# Patient Record
Sex: Female | Born: 1935 | Race: White | Hispanic: No | Smoking: Current every day smoker
Health system: Southern US, Community
[De-identification: ages and names within clinical notes are randomized; demographics above are authoritative.]

## PROBLEM LIST (undated history)

## (undated) DIAGNOSIS — F329 Major depressive disorder, single episode, unspecified: Secondary | ICD-10-CM

## (undated) DIAGNOSIS — J439 Emphysema, unspecified: Secondary | ICD-10-CM

## (undated) DIAGNOSIS — J449 Chronic obstructive pulmonary disease, unspecified: Secondary | ICD-10-CM

## (undated) DIAGNOSIS — E785 Hyperlipidemia, unspecified: Secondary | ICD-10-CM

## (undated) DIAGNOSIS — I10 Essential (primary) hypertension: Secondary | ICD-10-CM

## (undated) DIAGNOSIS — I714 Abdominal aortic aneurysm, without rupture, unspecified: Secondary | ICD-10-CM

## (undated) DIAGNOSIS — K219 Gastro-esophageal reflux disease without esophagitis: Secondary | ICD-10-CM

## (undated) DIAGNOSIS — Z87891 Personal history of nicotine dependence: Secondary | ICD-10-CM

## (undated) DIAGNOSIS — I4891 Unspecified atrial fibrillation: Secondary | ICD-10-CM

## (undated) DIAGNOSIS — T7840XA Allergy, unspecified, initial encounter: Secondary | ICD-10-CM

## (undated) DIAGNOSIS — N289 Disorder of kidney and ureter, unspecified: Secondary | ICD-10-CM

## (undated) DIAGNOSIS — F32A Depression, unspecified: Secondary | ICD-10-CM

## (undated) DIAGNOSIS — Z5189 Encounter for other specified aftercare: Secondary | ICD-10-CM

## (undated) DIAGNOSIS — D649 Anemia, unspecified: Secondary | ICD-10-CM

## (undated) DIAGNOSIS — Z86718 Personal history of other venous thrombosis and embolism: Secondary | ICD-10-CM

## (undated) DIAGNOSIS — E079 Disorder of thyroid, unspecified: Secondary | ICD-10-CM

## (undated) DIAGNOSIS — I2510 Atherosclerotic heart disease of native coronary artery without angina pectoris: Secondary | ICD-10-CM

## (undated) DIAGNOSIS — I509 Heart failure, unspecified: Secondary | ICD-10-CM

## (undated) HISTORY — DX: Emphysema, unspecified: J43.9

## (undated) HISTORY — PX: CARDIAC SURGERY: SHX584

## (undated) HISTORY — DX: Heart failure, unspecified: I50.9

## (undated) HISTORY — DX: Personal history of nicotine dependence: Z87.891

## (undated) HISTORY — DX: Disorder of thyroid, unspecified: E07.9

## (undated) HISTORY — PX: APPENDECTOMY: SHX54

## (undated) HISTORY — PX: EYE SURGERY: SHX253

## (undated) HISTORY — DX: Encounter for other specified aftercare: Z51.89

## (undated) HISTORY — PX: CHOLECYSTECTOMY: SHX55

## (undated) HISTORY — DX: Allergy, unspecified, initial encounter: T78.40XA

## (undated) HISTORY — DX: Hyperlipidemia, unspecified: E78.5

## (undated) HISTORY — DX: Depression, unspecified: F32.A

## (undated) HISTORY — PX: ABDOMINAL HYSTERECTOMY: SHX81

---
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
## 2010-11-26 DIAGNOSIS — I10 Essential (primary) hypertension: Secondary | ICD-10-CM | POA: Insufficient documentation

## 2010-11-26 DIAGNOSIS — E785 Hyperlipidemia, unspecified: Secondary | ICD-10-CM | POA: Insufficient documentation

## 2010-11-26 DIAGNOSIS — I480 Paroxysmal atrial fibrillation: Secondary | ICD-10-CM | POA: Insufficient documentation

## 2010-12-22 DIAGNOSIS — M79605 Pain in left leg: Secondary | ICD-10-CM | POA: Insufficient documentation

## 2011-07-21 DIAGNOSIS — M19019 Primary osteoarthritis, unspecified shoulder: Secondary | ICD-10-CM | POA: Insufficient documentation

## 2011-07-21 DIAGNOSIS — M5430 Sciatica, unspecified side: Secondary | ICD-10-CM | POA: Insufficient documentation

## 2011-07-21 DIAGNOSIS — M47812 Spondylosis without myelopathy or radiculopathy, cervical region: Secondary | ICD-10-CM | POA: Insufficient documentation

## 2011-07-21 DIAGNOSIS — M25519 Pain in unspecified shoulder: Secondary | ICD-10-CM | POA: Insufficient documentation

## 2011-11-11 DIAGNOSIS — F3341 Major depressive disorder, recurrent, in partial remission: Secondary | ICD-10-CM | POA: Insufficient documentation

## 2012-07-02 DIAGNOSIS — K219 Gastro-esophageal reflux disease without esophagitis: Secondary | ICD-10-CM | POA: Insufficient documentation

## 2012-07-02 DIAGNOSIS — E039 Hypothyroidism, unspecified: Secondary | ICD-10-CM | POA: Insufficient documentation

## 2012-08-03 DIAGNOSIS — I25118 Atherosclerotic heart disease of native coronary artery with other forms of angina pectoris: Secondary | ICD-10-CM | POA: Insufficient documentation

## 2012-08-03 DIAGNOSIS — I2510 Atherosclerotic heart disease of native coronary artery without angina pectoris: Secondary | ICD-10-CM | POA: Insufficient documentation

## 2012-08-07 DIAGNOSIS — D649 Anemia, unspecified: Secondary | ICD-10-CM | POA: Insufficient documentation

## 2012-08-09 DIAGNOSIS — M5136 Other intervertebral disc degeneration, lumbar region: Secondary | ICD-10-CM | POA: Insufficient documentation

## 2013-01-14 DIAGNOSIS — M659 Synovitis and tenosynovitis, unspecified: Secondary | ICD-10-CM | POA: Insufficient documentation

## 2013-01-14 DIAGNOSIS — M2010 Hallux valgus (acquired), unspecified foot: Secondary | ICD-10-CM | POA: Insufficient documentation

## 2013-01-14 DIAGNOSIS — M2040 Other hammer toe(s) (acquired), unspecified foot: Secondary | ICD-10-CM | POA: Insufficient documentation

## 2013-02-25 DIAGNOSIS — B351 Tinea unguium: Secondary | ICD-10-CM | POA: Insufficient documentation

## 2013-09-03 DIAGNOSIS — I498 Other specified cardiac arrhythmias: Secondary | ICD-10-CM | POA: Insufficient documentation

## 2014-06-18 DIAGNOSIS — K279 Peptic ulcer, site unspecified, unspecified as acute or chronic, without hemorrhage or perforation: Secondary | ICD-10-CM | POA: Insufficient documentation

## 2015-07-01 ENCOUNTER — Encounter: Payer: Self-pay | Admitting: Family Medicine

## 2015-07-01 ENCOUNTER — Other Ambulatory Visit: Payer: Self-pay | Admitting: Family Medicine

## 2015-07-01 DIAGNOSIS — Z87891 Personal history of nicotine dependence: Secondary | ICD-10-CM

## 2015-07-01 DIAGNOSIS — F17200 Nicotine dependence, unspecified, uncomplicated: Secondary | ICD-10-CM | POA: Insufficient documentation

## 2015-07-01 HISTORY — DX: Personal history of nicotine dependence: Z87.891

## 2015-07-03 ENCOUNTER — Ambulatory Visit
Admission: RE | Admit: 2015-07-03 | Discharge: 2015-07-03 | Disposition: A | Discharge status: Home / Self Care | Payer: Medicare Other | Source: Ambulatory Visit | Attending: Family Medicine | Admitting: Family Medicine

## 2015-07-03 ENCOUNTER — Inpatient Hospital Stay: Payer: Medicare Other | Attending: Family Medicine | Admitting: Family Medicine

## 2015-07-03 ENCOUNTER — Encounter: Payer: Self-pay | Admitting: Family Medicine

## 2015-07-03 DIAGNOSIS — Z122 Encounter for screening for malignant neoplasm of respiratory organs: Secondary | ICD-10-CM

## 2015-07-03 DIAGNOSIS — I2510 Atherosclerotic heart disease of native coronary artery without angina pectoris: Secondary | ICD-10-CM | POA: Insufficient documentation

## 2015-07-03 DIAGNOSIS — I700 Atherosclerosis of aorta: Secondary | ICD-10-CM | POA: Insufficient documentation

## 2015-07-03 DIAGNOSIS — Z87891 Personal history of nicotine dependence: Secondary | ICD-10-CM | POA: Diagnosis present

## 2015-07-03 NOTE — Progress Notes (Signed)
In accordance with CMS guidelines, patient has meet eligibility criteria including age, absence of signs or symptoms of lung cancer, the specific calculation of cigarette smoking pack-years was 60 years and is a current smoker.   A shared decision-making session was conducted prior to the performance of CT scan. This includes one or more decision aids, includes benefits and harms of screening, follow-up diagnostic testing, over-diagnosis, false positive rate, and total radiation exposure.  Counseling on the importance of adherence to annual lung cancer LDCT screening, impact of co-morbidities, and ability or willingness to undergo diagnosis and treatment is imperative for compliance of the program.  Counseling on the importance of continued smoking cessation for former smokers; the importance of smoking cessation for current smokers and information about tobacco cessation interventions have been given to patient including the [facility], 1800 quit [place], as well as Cancer Center specific smoking cessation programs.  Written order for lung cancer screening with LDCT has been given to the patient and any and all questions have been answered to the best of my abilities.   Yearly follow up will be scheduled by Shawn Perkins, Thoracic Navigator.   

## 2015-07-07 ENCOUNTER — Telehealth: Payer: Self-pay | Admitting: *Deleted

## 2015-07-07 NOTE — Telephone Encounter (Signed)
Notified patient of LDCT lung cancer screening results of Lung Rads 3S finding with recommendation for 6 month follow up imaging. Also notified of incidental finding noted below. Patient verbalizes understanding.   IMPRESSION: 1. Lung-Rads category 3S, probably benign findings. Short-term follow-up in 6 months is recommended with repeat low-dose chest CT without contrast (please use the following order, "CT CHEST LCS NODULE FOLLOW-UP W/O CM"). 2. Aortic atherosclerosis and coronary artery calcification.

## 2015-07-29 ENCOUNTER — Inpatient Hospital Stay: Payer: Medicare Other

## 2015-07-29 ENCOUNTER — Inpatient Hospital Stay
Admission: EM | Admit: 2015-07-29 | Discharge: 2015-08-01 | DRG: 812 | Disposition: A | Discharge status: Home / Self Care | Payer: Medicare Other | Attending: Internal Medicine | Admitting: Internal Medicine

## 2015-07-29 ENCOUNTER — Encounter: Payer: Self-pay | Admitting: Emergency Medicine

## 2015-07-29 DIAGNOSIS — D62 Acute posthemorrhagic anemia: Principal | ICD-10-CM | POA: Diagnosis present

## 2015-07-29 DIAGNOSIS — F17210 Nicotine dependence, cigarettes, uncomplicated: Secondary | ICD-10-CM | POA: Diagnosis present

## 2015-07-29 DIAGNOSIS — Z91041 Radiographic dye allergy status: Secondary | ICD-10-CM | POA: Diagnosis not present

## 2015-07-29 DIAGNOSIS — K219 Gastro-esophageal reflux disease without esophagitis: Secondary | ICD-10-CM | POA: Diagnosis present

## 2015-07-29 DIAGNOSIS — K621 Rectal polyp: Secondary | ICD-10-CM | POA: Diagnosis present

## 2015-07-29 DIAGNOSIS — E876 Hypokalemia: Secondary | ICD-10-CM | POA: Diagnosis present

## 2015-07-29 DIAGNOSIS — F329 Major depressive disorder, single episode, unspecified: Secondary | ICD-10-CM | POA: Diagnosis present

## 2015-07-29 DIAGNOSIS — G629 Polyneuropathy, unspecified: Secondary | ICD-10-CM | POA: Diagnosis present

## 2015-07-29 DIAGNOSIS — Z716 Tobacco abuse counseling: Secondary | ICD-10-CM

## 2015-07-29 DIAGNOSIS — K640 First degree hemorrhoids: Secondary | ICD-10-CM | POA: Diagnosis present

## 2015-07-29 DIAGNOSIS — I10 Essential (primary) hypertension: Secondary | ICD-10-CM | POA: Diagnosis present

## 2015-07-29 DIAGNOSIS — E039 Hypothyroidism, unspecified: Secondary | ICD-10-CM | POA: Diagnosis present

## 2015-07-29 DIAGNOSIS — Z8711 Personal history of peptic ulcer disease: Secondary | ICD-10-CM | POA: Diagnosis not present

## 2015-07-29 DIAGNOSIS — K2980 Duodenitis without bleeding: Secondary | ICD-10-CM | POA: Diagnosis present

## 2015-07-29 DIAGNOSIS — Z882 Allergy status to sulfonamides status: Secondary | ICD-10-CM | POA: Diagnosis not present

## 2015-07-29 DIAGNOSIS — Z7982 Long term (current) use of aspirin: Secondary | ICD-10-CM

## 2015-07-29 DIAGNOSIS — Z9889 Other specified postprocedural states: Secondary | ICD-10-CM | POA: Diagnosis not present

## 2015-07-29 DIAGNOSIS — Z79899 Other long term (current) drug therapy: Secondary | ICD-10-CM | POA: Diagnosis not present

## 2015-07-29 DIAGNOSIS — Z9049 Acquired absence of other specified parts of digestive tract: Secondary | ICD-10-CM

## 2015-07-29 DIAGNOSIS — K921 Melena: Secondary | ICD-10-CM | POA: Diagnosis present

## 2015-07-29 DIAGNOSIS — Z7901 Long term (current) use of anticoagulants: Secondary | ICD-10-CM

## 2015-07-29 DIAGNOSIS — I482 Chronic atrial fibrillation: Secondary | ICD-10-CM | POA: Diagnosis present

## 2015-07-29 DIAGNOSIS — K922 Gastrointestinal hemorrhage, unspecified: Secondary | ICD-10-CM

## 2015-07-29 DIAGNOSIS — E785 Hyperlipidemia, unspecified: Secondary | ICD-10-CM | POA: Diagnosis present

## 2015-07-29 DIAGNOSIS — D500 Iron deficiency anemia secondary to blood loss (chronic): Secondary | ICD-10-CM | POA: Diagnosis present

## 2015-07-29 DIAGNOSIS — R1084 Generalized abdominal pain: Secondary | ICD-10-CM

## 2015-07-29 DIAGNOSIS — J449 Chronic obstructive pulmonary disease, unspecified: Secondary | ICD-10-CM | POA: Diagnosis present

## 2015-07-29 DIAGNOSIS — Z90710 Acquired absence of both cervix and uterus: Secondary | ICD-10-CM | POA: Diagnosis not present

## 2015-07-29 DIAGNOSIS — F419 Anxiety disorder, unspecified: Secondary | ICD-10-CM | POA: Diagnosis present

## 2015-07-29 DIAGNOSIS — D649 Anemia, unspecified: Secondary | ICD-10-CM

## 2015-07-29 HISTORY — DX: Unspecified atrial fibrillation: I48.91

## 2015-07-29 HISTORY — DX: Disorder of kidney and ureter, unspecified: N28.9

## 2015-07-29 HISTORY — DX: Essential (primary) hypertension: I10

## 2015-07-29 HISTORY — DX: Anemia, unspecified: D64.9

## 2015-07-29 HISTORY — DX: Chronic obstructive pulmonary disease, unspecified: J44.9

## 2015-07-29 HISTORY — DX: Personal history of other venous thrombosis and embolism: Z86.718

## 2015-07-29 HISTORY — DX: Gastro-esophageal reflux disease without esophagitis: K21.9

## 2015-07-29 LAB — CBC WITH DIFFERENTIAL/PLATELET
Basophils Absolute: 0.1 10*3/uL (ref 0–0.1)
Basophils Relative: 2 %
Eosinophils Absolute: 0.2 10*3/uL (ref 0–0.7)
Eosinophils Relative: 5 %
HCT: 15.2 % — ABNORMAL LOW (ref 35.0–47.0)
Hemoglobin: 4.4 g/dL — CL (ref 12.0–16.0)
Lymphocytes Relative: 24 %
Lymphs Abs: 0.8 10*3/uL — ABNORMAL LOW (ref 1.0–3.6)
MCH: 19 pg — ABNORMAL LOW (ref 26.0–34.0)
MCHC: 29.2 g/dL — ABNORMAL LOW (ref 32.0–36.0)
MCV: 65.2 fL — ABNORMAL LOW (ref 80.0–100.0)
Monocytes Absolute: 0.3 10*3/uL (ref 0.2–0.9)
Monocytes Relative: 8 %
Neutro Abs: 2 10*3/uL (ref 1.4–6.5)
Neutrophils Relative %: 61 %
Platelets: 302 10*3/uL (ref 150–440)
RBC: 2.34 MIL/uL — ABNORMAL LOW (ref 3.80–5.20)
RDW: 19.7 % — ABNORMAL HIGH (ref 11.5–14.5)
WBC: 3.3 10*3/uL — ABNORMAL LOW (ref 3.6–11.0)

## 2015-07-29 LAB — PROTIME-INR
INR: 2.87
Prothrombin Time: 29.6 seconds — ABNORMAL HIGH (ref 11.4–15.0)

## 2015-07-29 LAB — TROPONIN I: Troponin I: <0.03 ng/mL (ref <0.031)

## 2015-07-29 LAB — COMPREHENSIVE METABOLIC PANEL
ALT: 10 U/L — ABNORMAL LOW (ref 14–54)
AST: 19 U/L (ref 15–41)
Albumin: 3.3 g/dL — ABNORMAL LOW (ref 3.5–5.0)
Alkaline Phosphatase: 89 U/L (ref 38–126)
Anion gap: 7 (ref 5–15)
BUN: 17 mg/dL (ref 6–20)
CO2: 21 mmol/L — ABNORMAL LOW (ref 22–32)
Calcium: 8.4 mg/dL — ABNORMAL LOW (ref 8.9–10.3)
Chloride: 107 mmol/L (ref 101–111)
Creatinine, Ser: 1.02 mg/dL — ABNORMAL HIGH (ref 0.44–1.00)
GFR calc Af Amer: 60 mL/min — ABNORMAL LOW (ref >60)
GFR calc non Af Amer: 52 mL/min — ABNORMAL LOW (ref >60)
Glucose, Bld: 159 mg/dL — ABNORMAL HIGH (ref 65–99)
Potassium: 3.4 mmol/L — ABNORMAL LOW (ref 3.5–5.1)
Sodium: 135 mmol/L (ref 135–145)
Total Bilirubin: 0.4 mg/dL (ref 0.3–1.2)
Total Protein: 6.7 g/dL (ref 6.5–8.1)

## 2015-07-29 LAB — ABO/RH: ABO/RH(D): A POS

## 2015-07-29 LAB — PREPARE RBC (CROSSMATCH)

## 2015-07-29 MED ORDER — PHYTONADIONE 5 MG PO TABS
5.0000 mg | ORAL_TABLET | Freq: Once | ORAL | Status: DC
  Filled 2015-07-29: qty 1

## 2015-07-29 MED ORDER — DEXTROSE 5 % IV SOLN
10.0000 mg | Freq: Once | INTRAVENOUS | Status: AC
  Administered 2015-07-29: 14:00:00 10 mg via INTRAVENOUS
  Filled 2015-07-29: qty 1

## 2015-07-29 MED ORDER — CARVEDILOL 12.5 MG PO TABS
12.5000 mg | ORAL_TABLET | Freq: Two times a day (BID) | ORAL | Status: DC
  Administered 2015-07-29 – 2015-07-31 (×5): 12.5 mg via ORAL
  Filled 2015-07-29 (×5): qty 1

## 2015-07-29 MED ORDER — DIPHENHYDRAMINE HCL 25 MG PO CAPS
25.0000 mg | ORAL_CAPSULE | Freq: Every day | ORAL | Status: DC
  Administered 2015-07-29 – 2015-07-31 (×3): 25 mg via ORAL
  Filled 2015-07-29 (×3): qty 1

## 2015-07-29 MED ORDER — GEMFIBROZIL 600 MG PO TABS
600.0000 mg | ORAL_TABLET | Freq: Two times a day (BID) | ORAL | Status: DC
Start: 2015-07-29 — End: 2015-08-01
  Administered 2015-07-30 – 2015-07-31 (×4): 600 mg via ORAL
  Filled 2015-07-29 (×9): qty 1

## 2015-07-29 MED ORDER — VENLAFAXINE HCL ER 75 MG PO CP24
150.0000 mg | ORAL_CAPSULE | Freq: Every day | ORAL | Status: DC
  Administered 2015-07-30 – 2015-07-31 (×2): 150 mg via ORAL
  Filled 2015-07-29 (×3): qty 2

## 2015-07-29 MED ORDER — PRAVASTATIN SODIUM 40 MG PO TABS
40.0000 mg | ORAL_TABLET | Freq: Every day | ORAL | Status: DC
  Administered 2015-07-29 – 2015-07-31 (×3): 40 mg via ORAL
  Filled 2015-07-29 (×3): qty 1

## 2015-07-29 MED ORDER — GABAPENTIN 300 MG PO CAPS
600.0000 mg | ORAL_CAPSULE | Freq: Two times a day (BID) | ORAL | Status: DC
  Administered 2015-07-29 – 2015-07-31 (×5): 600 mg via ORAL
  Filled 2015-07-29 (×6): qty 2

## 2015-07-29 MED ORDER — SODIUM CHLORIDE 0.9 % IV SOLN
10.0000 mL/h | Freq: Once | INTRAVENOUS | Status: AC
  Administered 2015-07-29: 17:00:00 10 mL/h via INTRAVENOUS

## 2015-07-29 MED ORDER — DIPHENHYDRAMINE-APAP (SLEEP) 25-500 MG PO TABS: 1.0000 | ORAL_TABLET | Freq: Every day | ORAL | Status: DC

## 2015-07-29 MED ORDER — SODIUM CHLORIDE 0.9 % IV SOLN
8.0000 mg/h | INTRAVENOUS | Status: DC
  Administered 2015-07-29 – 2015-07-31 (×6): 8 mg/h via INTRAVENOUS
  Filled 2015-07-29 (×6): qty 80

## 2015-07-29 MED ORDER — SODIUM CHLORIDE 0.9 % IV SOLN
80.0000 mg | Freq: Once | INTRAVENOUS | Status: AC
  Administered 2015-07-29: 15:00:00 80 mg via INTRAVENOUS
  Filled 2015-07-29: qty 80

## 2015-07-29 MED ORDER — BARIUM SULFATE 2.1 % PO SUSP
450.0000 mL | ORAL | Status: AC
Start: 2015-07-29 — End: 2015-07-29
  Administered 2015-07-29 (×2): 450 mL via ORAL

## 2015-07-29 MED ORDER — PANTOPRAZOLE SODIUM 40 MG IV SOLR: 40.0000 mg | Freq: Two times a day (BID) | INTRAVENOUS | Status: DC

## 2015-07-29 MED ORDER — CLONAZEPAM 0.5 MG PO TABS
0.5000 mg | ORAL_TABLET | Freq: Every day | ORAL | Status: DC
  Administered 2015-07-29 – 2015-07-31 (×3): 0.5 mg via ORAL
  Filled 2015-07-29 (×3): qty 1

## 2015-07-29 MED ORDER — CHOLESTYRAMINE LIGHT 4 G PO PACK
4.0000 g | PACK | Freq: Every day | ORAL | Status: DC
  Administered 2015-07-30: 4 g via ORAL
  Filled 2015-07-29 (×3): qty 1

## 2015-07-29 MED ORDER — VENLAFAXINE HCL ER 75 MG PO CP24: 75.0000 mg | ORAL_CAPSULE | Freq: Two times a day (BID) | ORAL | Status: DC

## 2015-07-29 MED ORDER — ACETAMINOPHEN 500 MG PO TABS
500.0000 mg | ORAL_TABLET | Freq: Every day | ORAL | Status: DC
  Administered 2015-07-29 – 2015-07-31 (×3): 500 mg via ORAL
  Filled 2015-07-29 (×3): qty 1

## 2015-07-29 MED ORDER — LEVOTHYROXINE SODIUM 100 MCG PO TABS
100.0000 ug | ORAL_TABLET | Freq: Every day | ORAL | Status: DC
  Administered 2015-07-30 – 2015-07-31 (×2): 100 ug via ORAL
  Filled 2015-07-29 (×2): qty 1

## 2015-07-29 MED ORDER — VENLAFAXINE HCL ER 75 MG PO CP24
75.0000 mg | ORAL_CAPSULE | Freq: Every day | ORAL | Status: DC
  Administered 2015-07-29 – 2015-07-31 (×3): 75 mg via ORAL
  Filled 2015-07-29 (×2): qty 1

## 2015-07-29 NOTE — Consult Note (Signed)
GI Inpatient Consult Note  Reason for Consult: Severe Anemia   Attending Requesting Consult: Dr. Luberta Mutter  History of Present Illness: Eileen Mack is a 80 y.o. female with a significant paroxsymal pmh of a fib on coumadin, presented to the ED after seeing her PCP for one month of fatigue, weakness, increased SOB, and generalized abdominal pain.  She denies dark stools.  She is on coumadin.  She does not take any iron supplements.  She reports she takes omeprazole 20 mg once daily, denies any heartburn or acid reflux.  She denies NSAID use.  She had an upper endoscopy in 2015, but unable to find any results. She does not know the indication for it or the results.  She had a colonoscopy in 2005 that showed a 10 mm polyp and several smaller polyps.  Unable to locate pathology.  Patient is unsure if she has had any other colonoscopies.  She reports history of transfusions, but says it many, many years ago.  Past Medical History:  Past Medical History  Diagnosis Date  . Personal history of tobacco use, presenting hazards to health 07/01/2015  . Atrial fibrillation (HCC)   . History of blood clots   . Renal disorder   . Hypertension   . Anemia   . GERD (gastroesophageal reflux disease)   . COPD (chronic obstructive pulmonary disease) (HCC)     Problem List: Patient Active Problem List   Diagnosis Date Noted  . Severe anemia 07/29/2015  . Personal history of tobacco use, presenting hazards to health 07/01/2015    Past Surgical History: Past Surgical History  Procedure Laterality Date  . Abdominal hysterectomy    . Cholecystectomy    . Cardiac surgery      Allergies: Allergies  Allergen Reactions  . Iodinated Diagnostic Agents Hives, Itching and Rash    She states she has to be premedicated. SPM  . Sulfa Antibiotics Rash    Home Medications: Prescriptions prior to admission  Medication Sig Dispense Refill Last Dose  . aspirin EC 81 MG tablet Take 81 mg by mouth  daily.   07/29/2015 at 0830  . carvedilol (COREG) 12.5 MG tablet Take 12.5 mg by mouth 2 (two) times daily with a meal.   07/29/2015 at Unknown time  . cholestyramine light (PREVALITE) 4 g packet Take 4 g by mouth daily.   07/29/2015 at Unknown time  . clonazePAM (KLONOPIN) 0.5 MG tablet Take 0.5 mg by mouth at bedtime.   07/28/2015 at Unknown time  . cloNIDine (CATAPRES) 0.2 MG tablet Take 0.2 mg by mouth 2 (two) times daily.   07/29/2015 at Unknown time  . diphenhydramine-acetaminophen (TYLENOL PM) 25-500 MG TABS tablet Take 1 tablet by mouth at bedtime.   07/28/2015 at Unknown time  . gabapentin (NEURONTIN) 300 MG capsule Take 600 mg by mouth 2 (two) times daily.   07/29/2015 at Unknown time  . gemfibrozil (LOPID) 600 MG tablet Take 600 mg by mouth 2 (two) times daily before a meal.   07/29/2015 at Unknown time  . levothyroxine (SYNTHROID, LEVOTHROID) 100 MCG tablet Take 100 mcg by mouth daily before breakfast.   07/29/2015 at Unknown time  . lisinopril (PRINIVIL,ZESTRIL) 20 MG tablet Take 20 mg by mouth daily.   07/29/2015 at Unknown time  . nitroGLYCERIN (NITROSTAT) 0.4 MG SL tablet Place 0.4 mg under the tongue every 5 (five) minutes as needed for chest pain.   PRN  . omeprazole (PRILOSEC) 20 MG capsule Take 20 mg by mouth 2 (  two) times daily.   07/29/2015 at Unknown time  . pravastatin (PRAVACHOL) 40 MG tablet Take 40 mg by mouth at bedtime.   07/28/2015 at Unknown time  . venlafaxine XR (EFFEXOR-XR) 75 MG 24 hr capsule Take 75-150 mg by mouth 2 (two) times daily. Pt. Takes 2 tablets every morning and 1 tablet every evening.   07/29/2015 at Unknown time  . warfarin (COUMADIN) 5 MG tablet Take 5-7.5 mg by mouth at bedtime. On Fridays Pt. Take 1.5 tablets (7.5 MG)   07/28/2015 at 2200   Home medication reconciliation was completed with the patient.   Scheduled Inpatient Medications:   . sodium chloride  10 mL/hr Intravenous Once  . diphenhydrAMINE  25 mg Oral QHS   And  . acetaminophen  500 mg Oral QHS   . carvedilol  12.5 mg Oral BID WC  . [START ON 07/30/2015] cholestyramine light  4 g Oral Daily  . clonazePAM  0.5 mg Oral QHS  . gabapentin  600 mg Oral BID  . gemfibrozil  600 mg Oral BID AC  . [START ON 07/30/2015] levothyroxine  100 mcg Oral QAC breakfast  . phytonadione  5 mg Oral Once  . pravastatin  40 mg Oral QHS  . [START ON 07/30/2015] venlafaxine XR  150 mg Oral Q breakfast  . venlafaxine XR  75 mg Oral Q supper    Continuous Inpatient Infusions:   . pantoprozole (PROTONIX) infusion 8 mg/hr (07/29/15 1601)    PRN Inpatient Medications:    Family History: family history is not on file.    Social History:   reports that she has been smoking.  She does not have any smokeless tobacco history on file.   Review of Systems: Constitutional: Weight is stable.  Eyes: No changes in vision. ENT: No oral lesions, sore throat.  GI: see HPI.  Heme/Lymph: No easy bruising.  CV: No chest pain.  GU: No hematuria.  Integumentary: No rashes.  Neuro: No headaches.  Psych: No depression/anxiety.  Endocrine: No heat/cold intolerance.  Allergic/Immunologic: No urticaria.  Resp: No cough, SOB.  Musculoskeletal: No joint swelling.    Physical Examination: BP 142/58 mmHg  Pulse 62  Temp(Src) 97.7 F (36.5 C) (Oral)  Resp 18  Ht 5\' 6"  (1.676 m)  Wt 78.019 kg (172 lb)  BMI 27.77 kg/m2  SpO2 100% Gen: NAD, alert and oriented x 4, very pale HEENT: PEERLA, EOMI, Neck: supple, no JVD or thyromegaly Chest: CTA bilaterally, no wheezes, crackles, or other adventitious sounds CV: RRR, no m/g/c/r Abd: soft, epigastric tenderness, ND, +BS in all four quadrants; no HSM, guarding, ridigity, or rebound tenderness Ext: no edema, well perfused with 2+ pulses, Skin: no rash or lesions noted Lymph: no LAD  Data: Lab Results  Component Value Date   WBC 3.3* 07/29/2015   HGB 4.4* 07/29/2015   HCT 15.2* 07/29/2015   MCV 65.2* 07/29/2015   PLT 302 07/29/2015    Recent Labs Lab  07/29/15 1133  HGB 4.4*   Lab Results  Component Value Date   NA 135 07/29/2015   K 3.4* 07/29/2015   CL 107 07/29/2015   CO2 21* 07/29/2015   BUN 17 07/29/2015   CREATININE 1.02* 07/29/2015   Lab Results  Component Value Date   ALT 10* 07/29/2015   AST 19 07/29/2015   ALKPHOS 89 07/29/2015   BILITOT 0.4 07/29/2015    Recent Labs Lab 07/29/15 1133  INR 2.87    Assessment/Plan: Ms. Orlena Sheldonendergraph is a 80 y.o. female  with severe anemia, 4.4 on admission, awaiting transfusion.  Ferritin checked at PCP yesterday, 6.  Magnesium at PCP yesterday was 1.3.  B12 was WNL.   BUN 17 Creatinine 1.02  Recommendations: Continue PPI drip. We recommend Ct ab/pel completed.  We will begin evaluation with EGD as soon as she is dynamically stable.  We will continue to follow with you.  Thank you for the consult. Please call with questions or concerns.  Carney Harder, PA-C  I personally performed these services.

## 2015-07-29 NOTE — ED Provider Notes (Addendum)
[facility] Emergency Department Provider Note  ____________________________________________   I have reviewed the triage vital signs and the nursing notes.   HISTORY  Chief Complaint Abnormal labs per PCP     HPI Eileen Mack is a 80 y.o. female who presents today complaining of feeling generally weak. She went to see her primary care doctor. She states is been feeling generally weak for the last month. She states that she feels winded just moving a little bit. She denies any chest pain or shortness of breath otherwise. She has a history of atrial fibrillation she is on Coumadin she states she has had to have blood transfusions in the past, she cannot recall why. She does have yearly colonoscopy & endoscopy she has not noticed any bright red blood per rectum or melena, her doctor sent her here becauseher hemoglobin was 4.3.      Past Medical History  Diagnosis Date  . Personal history of tobacco use, presenting hazards to health 07/01/2015  . Atrial fibrillation (HCC)   . History of blood clots   . Renal disorder     Patient Active Problem List   Diagnosis Date Noted  . Personal history of tobacco use, presenting hazards to health 07/01/2015    Past Surgical History  Procedure Laterality Date  . Abdominal hysterectomy    . Cholecystectomy    . Cardiac surgery      No current outpatient prescriptions on file.  Allergies Iodinated diagnostic agents and Sulfa antibiotics  No family history on file.  Social History Social History  Substance Use Topics  . Smoking status: Current Every Day Smoker -- 1.00 packs/day for 60 years  . Smokeless tobacco: None  . Alcohol Use: None    Review of Systems Constitutional: No fever/chills Eyes: No visual changes. ENT: No sore throat. No stiff neck no neck pain Cardiovascular: Denies chest pain. Respiratory:Feels winded when she walks around Gastrointestinal:   no vomiting.  No diarrhea.  No  constipation. Genitourinary: Negative for dysuria. Musculoskeletal: Negative lower extremity swelling Skin: Negative for rash. Neurological: Negative for headaches, focal weakness or numbness. 10-point ROS otherwise negative.  ____________________________________________   PHYSICAL EXAM:  VITAL SIGNS: ED Triage Vitals  Enc Vitals Group     BP 07/29/15 1103 122/41 mmHg     Pulse Rate 07/29/15 1103 93     Resp 07/29/15 1103 18     Temp 07/29/15 1103 98.2 F (36.8 C)     Temp Source 07/29/15 1103 Oral     SpO2 07/29/15 1103 93 %     Weight 07/29/15 1103 172 lb (78.019 kg)     Height 07/29/15 1103  (1.676 m)     Head Cir --      Peak Flow --      Pain Score 07/29/15 1104 4     Pain Loc --      Pain Edu? --      Excl. in GC? --     Constitutional: Alert and oriented. Well appearing and in no acute distress. Eyes: Conjunctivae are Very pale. PERRL. EOMI. Head: Atraumatic. Nose: No congestion/rhinnorhea. Mouth/Throat: Mucous membranes are moist.  Oropharynx non-erythematous. Neck: No stridor.   Nontender with no meningismus Cardiovascular: Normal rate, regular rhythm. Grossly normal heart sounds.  Good peripheral circulation. Respiratory: Normal respiratory effort.  No retractions. Lungs CTAB. Abdominal: Soft and nontender. No distention. No guarding no rebound Back:  There is no focal tenderness or step off there is no midline tenderness  there are no lesions noted. there is no CVA tenderness Rectal exam: guaiac positive brown stool Musculoskeletal: No lower extremity tenderness. No joint effusions, no DVT signs strong distal pulses no edema Neurologic:  Normal speech and language. No gross focal neurologic deficits are appreciated.  Skin:  Skin is warm, dry and intact. No rash noted. Psychiatric: Mood and affect are normal. Speech and behavior are normal.  ____________________________________________   LABS (all labs ordered are listed, but only abnormal results are  displayed)  Labs Reviewed  PROTIME-INR  COMPREHENSIVE METABOLIC PANEL  CBC WITH DIFFERENTIAL/PLATELET  TYPE AND SCREEN   ____________________________________________  EKG  I personally interpreted any EKGs ordered by me or triage Sinus rhythm rate 67 bpm no acute ST elevation or acute ST depression normal axis unremarkable EKG ____________________________________________  RADIOLOGY  I reviewed any imaging ordered by me or triage that were performed during my shift and, if possible, patient and/or family made aware of any abnormal findings. ____________________________________________   PROCEDURES  Procedure(s) performed: None  Critical Care performed: None  ____________________________________________   INITIAL IMPRESSION / ASSESSMENT AND PLAN / ED COURSE  Pertinent labs & imaging results that were available during my care of the patient were reviewed by me and considered in my medical decision making (see chart for details).  Patient with symptomatic significant anemia will require transfusion and admission for GI bleed. No acute distress at this time. Gradual process likely. ____________________________________________   FINAL CLINICAL IMPRESSION(S) / ED DIAGNOSES  Final diagnoses:  None      This chart was dictated using voice recognition software.  Despite best efforts to proofread,  errors can occur which can change meaning.     Jeanmarie PlantJames A Christle Nolting, MD 07/29/15 1130  Jeanmarie PlantJames A Lavelle Berland, MD 07/29/15 [phone]

## 2015-07-29 NOTE — Consult Note (Signed)
Patient on coumadin and presents with hgb 4.4 and hx of 10mm polyp removed over 10 years ago.   See Sung Amabileari Richards PA complete consult  note.  Will transfuse to at least 8 and then do EGD and colonoscopy.

## 2015-07-29 NOTE — H&P (Signed)
[facility] [place] at [facility]   PATIENT NAME: Jonte Wollam    MR#:  [id number]  DATE OF BIRTH:  10-Aug-1935  DATE OF ADMISSION:  07/29/2015  PRIMARY CARE PHYSICIAN: Cyndie Mull, DO   REQUESTING/REFERRING PHYSICIAN: Dr. Rexford Maus  CHIEF COMPLAINT:   Chief Complaint  Patient presents with  . Abnormal labs per PCP     HISTORY OF PRESENT ILLNESS:  Taetum Flewellen  is a 80 y.o. female with a known history of hypertension, COPD, neuropathy, chronic atrial fibrillation on Coumadin complaints of generalized weakness and she went to see her primary doctor. Patient had a blood work done which showed hemoglobin of 4,she was sent in here for further evaluation. According to the patient she has beenA lot of fatigue and generalized weakness for the past 1 month. . Coumadin was last night. Patient denies any black stool and hematemesis or weight loss. Patient also denies any appetite problems. Patient has a history of peptic ulcer disease before.  PAST MEDICAL HISTORY:   Past Medical History  Diagnosis Date  . Personal history of tobacco use, presenting hazards to health 07/01/2015  . Atrial fibrillation (HCC)   . History of blood clots   . Renal disorder   . Hypertension   . Anemia   . GERD (gastroesophageal reflux disease)   . COPD (chronic obstructive pulmonary disease) (HCC)     PAST SURGICAL HISTOIRY:   Past Surgical History  Procedure Laterality Date  . Abdominal hysterectomy    . Cholecystectomy    . Cardiac surgery      SOCIAL HISTORY:   Social History  Substance Use Topics  . Smoking status: Current Every Day Smoker -- 1.00 packs/day for 60 years  . Smokeless tobacco: Not on file  . Alcohol Use: Not on file    FAMILY HISTORY:  No family history on file.  DRUG ALLERGIES:   Allergies  Allergen Reactions  . Iodinated Diagnostic Agents Hives, Itching and Rash    She states she has to be premedicated. SPM  . Sulfa  Antibiotics Rash    REVIEW OF SYSTEMS:  CONSTITUTIONAL: Has fatigue,, generalized weakness. EYES: No blurred or double vision.  EARS, NOSE, AND THROAT: No tinnitus or ear pain.  RESPIRATORY: No cough, shortness of breath, wheezing or hemoptysis.  CARDIOVASCULAR: No chest pain, orthopnea, edema.  GASTROINTESTINAL: No nausea, vomiting, diarrhea or abdominal pain.  GENITOURINARY: No dysuria, hematuria.  ENDOCRINE: No polyuria, nocturia,  HEMATOLOGY: Has anemia at this time. Denies any easy bruising. or bleeding SKIN: No rash or lesion. MUSCULOSKELETAL: No joint pain or arthritis.   NEUROLOGIC: No tingling, numbness, weakness.  PSYCHIATRY: No anxiety or depression.   MEDICATIONS AT HOME:   Prior to Admission medications   Medication Sig Start Date End Date Taking? Authorizing Provider  aspirin EC 81 MG tablet Take 81 mg by mouth daily.   Yes Historical Provider, MD  carvedilol (COREG) 12.5 MG tablet Take 12.5 mg by mouth 2 (two) times daily with a meal.   Yes Historical Provider, MD  cholestyramine light (PREVALITE) 4 g packet Take 4 g by mouth daily.   Yes Historical Provider, MD  clonazePAM (KLONOPIN) 0.5 MG tablet Take 0.5 mg by mouth at bedtime.   Yes Historical Provider, MD  cloNIDine (CATAPRES) 0.2 MG tablet Take 0.2 mg by mouth 2 (two) times daily.   Yes Historical Provider, MD  diphenhydramine-acetaminophen (TYLENOL PM) 25-500 MG TABS tablet Take 1 tablet by mouth at bedtime.   Yes Historical  Provider, MD  gabapentin (NEURONTIN) 300 MG capsule Take 600 mg by mouth 2 (two) times daily.   Yes Historical Provider, MD  gemfibrozil (LOPID) 600 MG tablet Take 600 mg by mouth 2 (two) times daily before a meal.   Yes Historical Provider, MD  levothyroxine (SYNTHROID, LEVOTHROID) 100 MCG tablet Take 100 mcg by mouth daily before breakfast.   Yes Historical Provider, MD  lisinopril (PRINIVIL,ZESTRIL) 20 MG tablet Take 20 mg by mouth daily.   Yes Historical Provider, MD  nitroGLYCERIN  (NITROSTAT) 0.4 MG SL tablet Place 0.4 mg under the tongue every 5 (five) minutes as needed for chest pain.   Yes Historical Provider, MD  omeprazole (PRILOSEC) 20 MG capsule Take 20 mg by mouth 2 (two) times daily.   Yes Historical Provider, MD  pravastatin (PRAVACHOL) 40 MG tablet Take 40 mg by mouth at bedtime.   Yes Historical Provider, MD  venlafaxine XR (EFFEXOR-XR) 75 MG 24 hr capsule Take 75-150 mg by mouth 2 (two) times daily. Pt. Takes 2 tablets every morning and 1 tablet every evening.   Yes Historical Provider, MD  warfarin (COUMADIN) 5 MG tablet Take 5-7.5 mg by mouth at bedtime. On Fridays Pt. Take 1.5 tablets (7.5 MG)   Yes Historical Provider, MD      VITAL SIGNS:  Blood pressure 142/58, pulse 62, temperature 97.7 F (36.5 C), temperature source Oral, resp. rate 18, height 5\' 6"  (1.676 m), weight 78.019 kg (172 lb), SpO2 100 %.  PHYSICAL EXAMINATION:  GENERAL:  80 y.o.-year-old patient lying in the bed , appears very pale. EYES: Pupils equal, round, reactive to light and accommodation. No scleral icterus. Extraocular muscles intact.  HEENT: Head atraumatic, normocephalic. Oropharynx and nasopharynx clear.  NECK:  Supple, no jugular venous distention. No thyroid enlargement, no tenderness.  LUNGS: Normal breath sounds bilaterally, no wheezing, rales,rhonchi or crepitation. No use of accessory muscles of respiration.  CARDIOVASCULAR: S1, S2 normal. No murmurs, rubs, or gallops.  ABDOMEN: Soft, nontender, nondistended. Bowel sounds present. No organomegaly or mass.  EXTREMITIES: No pedal edema, cyanosis, or clubbing.  NEUROLOGIC: Cranial nerves II through XII are intact. Muscle strength 5/5 in all extremities. Sensation intact. Gait not checked.  PSYCHIATRIC: The patient is alert and oriented x 3.  SKIN: No obvious rash, lesion, or ulcer.   LABORATORY PANEL:   CBC  Recent Labs Lab 07/29/15 1133  WBC 3.3*  HGB 4.4*  HCT 15.2*  PLT 302    ------------------------------------------------------------------------------------------------------------------  Chemistries   Recent Labs Lab 07/29/15 1133  NA 135  K 3.4*  CL 107  CO2 21*  GLUCOSE 159*  BUN 17  CREATININE 1.02*  CALCIUM 8.4*  AST 19  ALT 10*  ALKPHOS 89  BILITOT 0.4   ------------------------------------------------------------------------------------------------------------------  Cardiac Enzymes  Recent Labs Lab 07/29/15 1133  TROPONINI <0.03   ------------------------------------------------------------------------------------------------------------------  RADIOLOGY:  No results found.  EKG:   Orders placed or performed during the hospital encounter of 07/29/15  . ED EKG  . ED EKG  . EKG 12-Lead  . EKG 12-Lead    IMPRESSION AND PLAN:  #1 severe acute anemia likely due to slow GI bleed: Transfuse 2 units RBC, Check CBC Again, GI Consult #2 Guaiac-Positive Stools Possible Due To Slow GI Bleed, History of Peptic Ulcer Disease before:*  On Protonix Drip, Hold the Coumadin. #3 History of Chronic Atrial Fibrillation: INR Is 2.8: Hold off on FFP at This Time Give Vitamin K, Hold Coumadin. Rate Controlled. #4 Showed COPD: No Wheezing. #5 History  of tobacco Abuse; the Patient Does Not Want to Quit C,ounseled Her Against  Smoking for 10 min.  All the  Hold the Coumadin,records are reviewed and case discussed with ED provider. Management plans discussed with the patient, family and they are in agreement.  CODE STATUS: full  TOTAL TIME TAKING CARE OF THIS PATIENT: 55 minutes.    Katha Hamming M.D on 07/29/2015 at 1:56 PM  Between 7am to 6pm - Pager - [phone]  After 6pm go to [email or web address] - password [email or web address] [facility]  [facility]  Office  [phone]  CC: Primary care physician; Luvenia Starch PATRICIA, DO  Note: This dictation was prepared with Dragon dictation along with smaller phrase technology. Any  transcriptional errors that result from this process are unintentional.

## 2015-07-29 NOTE — ED Notes (Signed)
Pt presents to ED after recommendation by PCP. Pt states PCP told her lab values were critical. Pt does not know which measurement. Pt states takes coumadin.

## 2015-07-30 LAB — CBC WITH DIFFERENTIAL/PLATELET
Basophils Absolute: 0.1 10*3/uL (ref 0–0.1)
Basophils Relative: 1 %
Eosinophils Absolute: 0.1 10*3/uL (ref 0–0.7)
Eosinophils Relative: 2 %
HCT: 26.6 % — ABNORMAL LOW (ref 35.0–47.0)
Hemoglobin: 8.5 g/dL — ABNORMAL LOW (ref 12.0–16.0)
Lymphocytes Relative: 16 %
Lymphs Abs: 0.7 10*3/uL — ABNORMAL LOW (ref 1.0–3.6)
MCH: 23 pg — ABNORMAL LOW (ref 26.0–34.0)
MCHC: 32.1 g/dL (ref 32.0–36.0)
MCV: 71.6 fL — ABNORMAL LOW (ref 80.0–100.0)
Monocytes Absolute: 0.5 10*3/uL (ref 0.2–0.9)
Monocytes Relative: 10 %
Neutro Abs: 3.5 10*3/uL (ref 1.4–6.5)
Neutrophils Relative %: 71 %
Platelets: 262 10*3/uL (ref 150–440)
RBC: 3.72 MIL/uL — ABNORMAL LOW (ref 3.80–5.20)
RDW: 20.9 % — ABNORMAL HIGH (ref 11.5–14.5)
WBC: 4.8 10*3/uL (ref 3.6–11.0)

## 2015-07-30 LAB — BASIC METABOLIC PANEL
Anion gap: 5 (ref 5–15)
BUN: 12 mg/dL (ref 6–20)
CO2: 22 mmol/L (ref 22–32)
Calcium: 8.4 mg/dL — ABNORMAL LOW (ref 8.9–10.3)
Chloride: 111 mmol/L (ref 101–111)
Creatinine, Ser: 0.84 mg/dL (ref 0.44–1.00)
GFR calc Af Amer: >60 mL/min (ref >60)
GFR calc non Af Amer: >60 mL/min (ref >60)
Glucose, Bld: 80 mg/dL (ref 65–99)
Potassium: 3.2 mmol/L — ABNORMAL LOW (ref 3.5–5.1)
Sodium: 138 mmol/L (ref 135–145)

## 2015-07-30 LAB — CBC
HCT: 20.4 % — ABNORMAL LOW (ref 35.0–47.0)
Hemoglobin: 6.5 g/dL — ABNORMAL LOW (ref 12.0–16.0)
MCH: 22 pg — ABNORMAL LOW (ref 26.0–34.0)
MCHC: 31.6 g/dL — ABNORMAL LOW (ref 32.0–36.0)
MCV: 69.7 fL — ABNORMAL LOW (ref 80.0–100.0)
Platelets: 275 10*3/uL (ref 150–440)
RBC: 2.93 MIL/uL — ABNORMAL LOW (ref 3.80–5.20)
RDW: 21.6 % — ABNORMAL HIGH (ref 11.5–14.5)
WBC: 4 10*3/uL (ref 3.6–11.0)

## 2015-07-30 LAB — PREPARE RBC (CROSSMATCH)

## 2015-07-30 MED ORDER — SODIUM CHLORIDE 0.9 % IV SOLN
Freq: Once | INTRAVENOUS | Status: AC
  Administered 2015-07-30: 09:00:00 via INTRAVENOUS

## 2015-07-30 NOTE — Care Management (Signed)
Admitted to [facility] with the diagnosis of anemia. Granddaughter, Arnell AsalBrethyn, lives with her for now. [phone]). States granddaughter will be moving out soon, her boyfriend is getting out of the services. Last seen Dr. Irven CoeSantayano 07/28/15. No home health. No skilled facility. Takes care of all basic and instrumental activities of daily living herself, drives. No falls. Appetite is O.K. Family will transport. Gwenette GreetBrenda S Donley Harland RN MSN CCM Care Management [phone]

## 2015-07-30 NOTE — Care Management Important Message (Signed)
Important Message  Patient Details  Name: Arcola JanskyRamona J Donn MRN: [id number] Date of Birth: 02/26/1936   Medicare Important Message Given:  Yes    Gwenette GreetBrenda S Ryley Bachtel, RN 07/30/2015, 9:08 AM

## 2015-07-30 NOTE — Plan of Care (Signed)
Problem: Health Behavior/Discharge Planning: Goal: Ability to manage health-related needs will improve Outcome: Progressing Pt hgb was 4.4 yesterday. 2 units of prbcs administered. F/u cbc this am.

## 2015-07-30 NOTE — Progress Notes (Signed)
[facility] - [place] at [facility]   PATIENT NAME: Eileen Mack    MRN#:  [id number]  DATE OF BIRTH:  1936/02/04  SUBJECTIVE:  Hospital Day: 1 day Eileen Mack is a 80 y.o. female presenting with Abnormal labs per PCP  Generalized weakness  Overnight events: Receive 2 unit packed red blood cell transfusion Interval Events: Remains weak, dyspnea on exertion, noted persistent low hemoglobin  REVIEW OF SYSTEMS:  CONSTITUTIONAL: No fever, Positive fatigue or weakness.  EYES: No blurred or double vision.  EARS, NOSE, AND THROAT: No tinnitus or ear pain.  RESPIRATORY: No cough, shortness of breath, wheezing or hemoptysis.  CARDIOVASCULAR: No chest pain, orthopnea, edema.  GASTROINTESTINAL: No nausea, vomiting, diarrhea or abdominal pain.  GENITOURINARY: No dysuria, hematuria.  ENDOCRINE: No polyuria, nocturia,  HEMATOLOGY: No anemia, easy bruising or bleeding SKIN: No rash or lesion. MUSCULOSKELETAL: No joint pain or arthritis.   NEUROLOGIC: No tingling, numbness, weakness.  PSYCHIATRY: No anxiety or depression.   DRUG ALLERGIES:   Allergies  Allergen Reactions  . Iodinated Diagnostic Agents Hives, Itching and Rash    She states she has to be premedicated. SPM  . Sulfa Antibiotics Rash    VITALS:  Blood pressure 163/58, pulse 68, temperature 98.2 F (36.8 C), temperature source Oral, resp. rate 18, height  (1.676 m), weight 78.019 kg (172 lb), SpO2 98 %.  PHYSICAL EXAMINATION:  VITAL SIGNS: Filed Vitals:   07/30/15 1300 07/30/15 1342  BP: 151/51 163/58  Pulse: 65 68  Temp: 98.2 F (36.8 C) 98.2 F (36.8 C)  Resp: 18 18   GENERAL:79 y.o.female currently in no acute distress.  HEAD: Normocephalic, atraumatic.  EYES: Pupils equal, round, reactive to light. Extraocular muscles intact. No scleral icterus.  MOUTH: Moist mucosal membrane. Dentition intact. No abscess noted.  EAR, NOSE, THROAT: Clear without exudates. No external  lesions.  NECK: Supple. No thyromegaly. No nodules. No JVD.  PULMONARY: Clear to ascultation, without wheeze rails or rhonci. No use of accessory muscles, Good respiratory effort. good air entry bilaterally CHEST: Nontender to palpation.  CARDIOVASCULAR: S1 and S2. Regular rate and rhythm. No murmurs, rubs, or gallops. No edema. Pedal pulses 2+ bilaterally.  GASTROINTESTINAL: Soft, nontender, nondistended. No masses. Positive bowel sounds. No hepatosplenomegaly.  MUSCULOSKELETAL: No swelling, clubbing, or edema. Range of motion full in all extremities.  NEUROLOGIC: Cranial nerves II through XII are intact. No gross focal neurological deficits. Sensation intact. Reflexes intact.  SKIN: No ulceration, lesions, rashes, or cyanosis. Skin warm and dry. Turgor intact.  PSYCHIATRIC: Mood, affect within normal limits. The patient is awake, alert and oriented x 3. Insight, judgment intact.      LABORATORY PANEL:   CBC  Recent Labs Lab 07/30/15 0526  WBC 4.0  HGB 6.5*  HCT 20.4*  PLT 275   ------------------------------------------------------------------------------------------------------------------  Chemistries   Recent Labs Lab 07/29/15 1133 07/30/15 0526  NA 135 138  K 3.4* 3.2*  CL 107 111  CO2 21* 22  GLUCOSE 159* 80  BUN 17 12  CREATININE 1.02* 0.84  CALCIUM 8.4* 8.4*  AST 19  --   ALT 10*  --   ALKPHOS 89  --   BILITOT 0.4  --    ------------------------------------------------------------------------------------------------------------------  Cardiac Enzymes  Recent Labs Lab 07/29/15 1133  TROPONINI <0.03   ------------------------------------------------------------------------------------------------------------------  RADIOLOGY:  Ct Abdomen Pelvis Wo Contrast  07/29/2015  CLINICAL DATA:  Paroxysmal atrial fibrillation, fatigue, weakness and increased shortness of breath with generalized abdominal pain for 1  month, on Coumadin, hypertension, GERD, COPD,  smoker EXAM: CT ABDOMEN AND PELVIS WITHOUT CONTRAST TECHNIQUE: Multidetector CT imaging of the abdomen and pelvis was performed following the standard protocol without IV contrast. Sagittal and coronal MPR images reconstructed from axial data set. Patient drank dilute oral contrast for exam. COMPARISON:  None FINDINGS: Lung bases clear. Low-attenuation of circulating blood consistent with anemia. Scattered atherosclerotic calcifications aorta, iliac arteries and and coronary arteries. Aneurysmal dilatation infrarenal abdominal aorta 4.2 x 4.0 cm in greatest axial dimensions image 40, extending 6.3 cm length and terminating above aortic bifurcation. Gallbladder surgically absent. Small probable peripelvic cyst inferior pole RIGHT kidney. Liver, spleen, pancreas, kidneys, and adrenal glands otherwise normal. Bladder and ureters normal appearance. Stomach decompressed, wall thickness inadequately assessed, unable to exclude wall thickening. Sigmoid diverticulosis without evidence of diverticulitis. Large and small bowel loops otherwise normal appearance. Uterus surgically absent with nonvisualization of RIGHT ovary and appendix. LEFT ovary normal size. No mass, adenopathy, free air, free fluid or inflammatory process. Bones demineralized. IMPRESSION: Anemia. Distal abdominal aortic aneurysm 4.2 x 4.0 cm extending 6.3 cm length. Minimal sigmoid diverticulosis without evidence of diverticulitis. Under distended stomach, unable to exclude gastric wall thickening in this setting. Electronically Signed   By: Ulyses SouthwardMark  Boles M.D.   On: 07/29/2015 21:30    EKG:   Orders placed or performed during the hospital encounter of 07/29/15  . ED EKG  . ED EKG  . EKG 12-Lead  . EKG 12-Lead    ASSESSMENT AND PLAN:   Eileen Mack is a 80 y.o. female presenting with Abnormal labs per PCP  And Generalized weakness Admitted 07/29/2015 : Day #: 1 day 1. Acute on chronic anemia secondary to GI bleed: Upper GI bleed, patient  states that this intermittent long-standing episodes of melena remains anemic this morning and transfuse 2 units packed red blood cells to get hemoglobin of 8, gastroenterology input appreciated plan for endoscopy colonoscopy, continue with Protonix 2. Hypothyroidism unspecified Synthroid 3. Hyperlipidemia unspecified statin therapy 4. Hypokalemia: Replace follow BMP tomorrow 5. Venous thromboembolism prophylactic: SCDs given active bleed   All the records are reviewed and case discussed with Care Management/Social Workerr. Management plans discussed with the patient, family and they are in agreement.  CODE STATUS: Full  TOTAL TIME TAKING CARE OF THIS PATIENT: 33 minutes.   POSSIBLE D/C IN 1-2 DAYS, DEPENDING ON CLINICAL CONDITION.   Jakari Jacot,  Mardi MainlandDavid K M.D on 07/30/2015 at 2:10 PM  Between 7am to 6pm - Pager - [phone]  After 6pm: House Pager: - [phone]  [facility]  Office  [phone]  CC: Primary care physician; Luvenia StarchSANTAYANA, GLORIA PATRICIA, DO

## 2015-07-30 NOTE — Plan of Care (Signed)
Problem: Bowel/Gastric: Goal: Will not experience complications related to bowel motility Outcome: Progressing Pt with complaints of abdominal pain, refuses pain meds, received 2 units of PRBC, pt voices any needs

## 2015-07-30 NOTE — Consult Note (Signed)
Patient has been transfused, color looks better.  CT scan noted small aneurysm of aorta. 4.2cm.  Will check PT and CBC in morning and make her NPO after midnight in case her labs allow an EGD tomorrow morning.

## 2015-07-31 ENCOUNTER — Inpatient Hospital Stay: Payer: Medicare Other | Admitting: Certified Registered Nurse Anesthetist

## 2015-07-31 ENCOUNTER — Encounter
Admission: EM | Disposition: A | Discharge status: Home / Self Care | Payer: Self-pay | Source: Home / Self Care | Attending: Internal Medicine

## 2015-07-31 HISTORY — PX: ESOPHAGOGASTRODUODENOSCOPY: SHX5428

## 2015-07-31 LAB — TYPE AND SCREEN
ABO/RH(D): A POS
Antibody Screen: NEGATIVE
Unit division: 0
Unit division: 0
Unit division: 0
Unit division: 0

## 2015-07-31 LAB — CBC
HCT: 26.1 % — ABNORMAL LOW (ref 35.0–47.0)
Hemoglobin: 8.6 g/dL — ABNORMAL LOW (ref 12.0–16.0)
MCH: 23.4 pg — ABNORMAL LOW (ref 26.0–34.0)
MCHC: 32.7 g/dL (ref 32.0–36.0)
MCV: 71.4 fL — ABNORMAL LOW (ref 80.0–100.0)
Platelets: 252 10*3/uL (ref 150–440)
RBC: 3.66 MIL/uL — ABNORMAL LOW (ref 3.80–5.20)
RDW: 20.3 % — ABNORMAL HIGH (ref 11.5–14.5)
WBC: 4.4 10*3/uL (ref 3.6–11.0)

## 2015-07-31 LAB — BASIC METABOLIC PANEL
Anion gap: 3 — ABNORMAL LOW (ref 5–15)
BUN: 9 mg/dL (ref 6–20)
CO2: 25 mmol/L (ref 22–32)
Calcium: 8.6 mg/dL — ABNORMAL LOW (ref 8.9–10.3)
Chloride: 111 mmol/L (ref 101–111)
Creatinine, Ser: 0.81 mg/dL (ref 0.44–1.00)
GFR calc Af Amer: >60 mL/min (ref >60)
GFR calc non Af Amer: >60 mL/min (ref >60)
Glucose, Bld: 86 mg/dL (ref 65–99)
Potassium: 3.1 mmol/L — ABNORMAL LOW (ref 3.5–5.1)
Sodium: 139 mmol/L (ref 135–145)

## 2015-07-31 LAB — PROTIME-INR
INR: 1.45
Prothrombin Time: 17.7 seconds — ABNORMAL HIGH (ref 11.4–15.0)

## 2015-07-31 SURGERY — EGD (ESOPHAGOGASTRODUODENOSCOPY)
Anesthesia: General

## 2015-07-31 MED ORDER — LIDOCAINE HCL (CARDIAC) 20 MG/ML IV SOLN
INTRAVENOUS | Status: DC | PRN
  Administered 2015-07-31: 60 mg via INTRAVENOUS

## 2015-07-31 MED ORDER — MIDAZOLAM HCL 2 MG/2ML IJ SOLN
INTRAMUSCULAR | Status: DC | PRN
  Administered 2015-07-31: 1 mg via INTRAVENOUS

## 2015-07-31 MED ORDER — PROPOFOL 500 MG/50ML IV EMUL
INTRAVENOUS | Status: DC | PRN
  Administered 2015-07-31: 120 ug/kg/min via INTRAVENOUS

## 2015-07-31 MED ORDER — PROPOFOL 10 MG/ML IV BOLUS
INTRAVENOUS | Status: DC | PRN
  Administered 2015-07-31: 10 mg via INTRAVENOUS
  Administered 2015-07-31: 20 mg via INTRAVENOUS

## 2015-07-31 MED ORDER — POTASSIUM CHLORIDE IN NACL 20-0.9 MEQ/L-% IV SOLN
INTRAVENOUS | Status: DC
  Administered 2015-07-31 (×2): via INTRAVENOUS
  Filled 2015-07-31 (×4): qty 1000

## 2015-07-31 MED ORDER — SODIUM CHLORIDE 0.9 % IV SOLN
INTRAVENOUS | Status: DC | PRN
  Administered 2015-07-31: 13:00:00 via INTRAVENOUS

## 2015-07-31 MED ORDER — ACETAMINOPHEN 325 MG PO TABS
650.0000 mg | ORAL_TABLET | Freq: Four times a day (QID) | ORAL | Status: DC | PRN
  Administered 2015-07-31: 11:00:00 650 mg via ORAL
  Filled 2015-07-31: qty 2

## 2015-07-31 MED ORDER — POLYETHYLENE GLYCOL 3350 17 GM/SCOOP PO POWD
1.0000 | Freq: Once | ORAL | Status: AC
  Administered 2015-07-31: 22:00:00 255 g via ORAL
  Filled 2015-07-31: qty 255

## 2015-07-31 MED ORDER — SODIUM CHLORIDE 0.9 % IV SOLN
INTRAVENOUS | Status: DC
  Administered 2015-07-31: 21:00:00 10 mL/h via INTRAVENOUS

## 2015-07-31 NOTE — Progress Notes (Signed)
[facility] - [place] [facility]   PATIENT NAME: Eileen GillsRamona Mack    MRN#:  [id number]  DATE OF BIRTH:  May 19, 1935  SUBJECTIVE:  Hospital Day: 2 days Eileen GillsRamona Thorman is a 80 y.o. female presenting with Abnormal labs per PCP  Generalized weakness  Overnight events: No overnight events Interval Events: Weakness somewhat improved, endoscopy planned for today  REVIEW OF SYSTEMS:  CONSTITUTIONAL: No fever, Positive fatigue or weakness.  EYES: No blurred or double vision.  EARS, NOSE, AND THROAT: No tinnitus or ear pain.  RESPIRATORY: No cough, shortness of breath, wheezing or hemoptysis.  CARDIOVASCULAR: No chest pain, orthopnea, edema.  GASTROINTESTINAL: No nausea, vomiting, diarrhea or abdominal pain.  GENITOURINARY: No dysuria, hematuria.  ENDOCRINE: No polyuria, nocturia,  HEMATOLOGY: No anemia, easy bruising or bleeding SKIN: No rash or lesion. MUSCULOSKELETAL: No joint pain or arthritis.   NEUROLOGIC: No tingling, numbness, weakness.  PSYCHIATRY: No anxiety or depression.   DRUG ALLERGIES:   Allergies  Allergen Reactions  . Iodinated Diagnostic Agents Hives, Itching and Rash    She states she has to be premedicated. SPM  . Sulfa Antibiotics Rash    VITALS:  Blood pressure 168/69, pulse 63, temperature 97.7 F (36.5 C), temperature source Tympanic, resp. rate 17, height 5\' 6"  (1.676 m), weight 78.019 kg (172 lb), SpO2 97 %.  PHYSICAL EXAMINATION:  VITAL SIGNS: Filed Vitals:   07/31/15 1404 07/31/15 1413  BP: 166/67 168/69  Pulse: 64 63  Temp:    Resp: 16 17   GENERAL:79 y.o.female currently in no acute distress.  HEAD: Normocephalic, atraumatic.  EYES: Pupils equal, round, reactive to light. Extraocular muscles intact. No scleral icterus.  MOUTH: Moist mucosal membrane. Dentition intact. No abscess noted.  EAR, NOSE, THROAT: Clear without exudates. No external lesions.  NECK: Supple. No thyromegaly. No nodules. No JVD.   PULMONARY: Clear to ascultation, without wheeze rails or rhonci. No use of accessory muscles, Good respiratory effort. good air entry bilaterally CHEST: Nontender to palpation.  CARDIOVASCULAR: S1 and S2. Regular rate and rhythm. No murmurs, rubs, or gallops. No edema. Pedal pulses 2+ bilaterally.  GASTROINTESTINAL: Soft, nontender, nondistended. No masses. Positive bowel sounds. No hepatosplenomegaly.  MUSCULOSKELETAL: No swelling, clubbing, or edema. Range of motion full in all extremities.  NEUROLOGIC: Cranial nerves II through XII are intact. No gross focal neurological deficits. Sensation intact. Reflexes intact.  SKIN: No ulceration, lesions, rashes, or cyanosis. Skin warm and dry. Turgor intact.  PSYCHIATRIC: Mood, affect within normal limits. The patient is awake, alert and oriented x 3. Insight, judgment intact.      LABORATORY PANEL:   CBC  Recent Labs Lab 07/31/15 0502  WBC 4.4  HGB 8.6*  HCT 26.1*  PLT 252   ------------------------------------------------------------------------------------------------------------------  Chemistries   Recent Labs Lab 07/29/15 1133  07/31/15 0502  NA 135  < > 139  K 3.4*  < > 3.1*  CL 107  < > 111  CO2 21*  < > 25  GLUCOSE 159*  < > 86  BUN 17  < > 9  CREATININE 1.02*  < > 0.81  CALCIUM 8.4*  < > 8.6*  AST 19  --   --   ALT 10*  --   --   ALKPHOS 89  --   --   BILITOT 0.4  --   --   < > = values in this interval not displayed. ------------------------------------------------------------------------------------------------------------------  Cardiac Enzymes  Recent Labs Lab 07/29/15 1133  TROPONINI <0.03   ------------------------------------------------------------------------------------------------------------------  RADIOLOGY:  Ct Abdomen Pelvis Wo Contrast  07/29/2015  CLINICAL DATA:  Paroxysmal atrial fibrillation, fatigue, weakness and increased shortness of breath with generalized abdominal pain for 1  month, on Coumadin, hypertension, GERD, COPD, smoker EXAM: CT ABDOMEN AND PELVIS WITHOUT CONTRAST TECHNIQUE: Multidetector CT imaging of the abdomen and pelvis was performed following the standard protocol without IV contrast. Sagittal and coronal MPR images reconstructed from axial data set. Patient drank dilute oral contrast for exam. COMPARISON:  None FINDINGS: Lung bases clear. Low-attenuation of circulating blood consistent with anemia. Scattered atherosclerotic calcifications aorta, iliac arteries and and coronary arteries. Aneurysmal dilatation infrarenal abdominal aorta 4.2 x 4.0 cm in greatest axial dimensions image 40, extending 6.3 cm length and terminating above aortic bifurcation. Gallbladder surgically absent. Small probable peripelvic cyst inferior pole RIGHT kidney. Liver, spleen, pancreas, kidneys, and adrenal glands otherwise normal. Bladder and ureters normal appearance. Stomach decompressed, wall thickness inadequately assessed, unable to exclude wall thickening. Sigmoid diverticulosis without evidence of diverticulitis. Large and small bowel loops otherwise normal appearance. Uterus surgically absent with nonvisualization of RIGHT ovary and appendix. LEFT ovary normal size. No mass, adenopathy, free air, free fluid or inflammatory process. Bones demineralized. IMPRESSION: Anemia. Distal abdominal aortic aneurysm 4.2 x 4.0 cm extending 6.3 cm length. Minimal sigmoid diverticulosis without evidence of diverticulitis. Under distended stomach, unable to exclude gastric wall thickening in this setting. Electronically Signed   By: Ulyses Southward M.D.   On: 07/29/2015 21:30    EKG:   Orders placed or performed during the hospital encounter of 07/29/15  . ED EKG  . ED EKG  . EKG 12-Lead  . EKG 12-Lead    ASSESSMENT AND PLAN:   Montgomery Rothlisberger is a 80 y.o. female presenting with Abnormal labs per PCP  And Generalized weakness Admitted 07/29/2015 : Day #: 2 days 1. Acute on chronic anemia  secondary to GI bleed: Upper GI bleed, Endoscopy reveals duodenitis no active bleeding, colonoscopy tomorrow per GI likely discharged thereafter 2. Hypothyroidism unspecified Synthroid 3. Hyperlipidemia unspecified statin therapy 4. Hypokalemia: Replace follow BMP tomorrow 5. Venous thromboembolism prophylactic: SCDs given active bleed   All the records are reviewed and case discussed with Care Management/Social Workerr. Management plans discussed with the patient, family and they are in agreement.  CODE STATUS: Full  TOTAL TIME TAKING CARE OF THIS PATIENT: 28 minutes.   POSSIBLE D/C IN 1 DAYS, DEPENDING ON CLINICAL CONDITION.   Chania Kochanski,  Mardi Mainland.D on 07/31/2015 at 3:10 PM  Between 7am to 6pm - Pager - [phone]  After 6pm: House Pager: - [phone]  [facility]  Office  [phone]  CC: Primary care physician; Luvenia Starch PATRICIA, DO

## 2015-07-31 NOTE — Transfer of Care (Signed)
Immediate Anesthesia Transfer of Care Note  Patient: Eileen Mack  Procedure(s) Performed: Procedure(s): ESOPHAGOGASTRODUODENOSCOPY (EGD) (N/A)  Patient Location: PACU  Anesthesia Type:General  Level of Consciousness: sedated  Airway & Oxygen Therapy: Patient Spontanous Breathing and Patient connected to nasal cannula oxygen  Post-op Assessment: Report given to RN and Post -op Vital signs reviewed and stable  Post vital signs: Reviewed and stable  Last Vitals:  Filed Vitals:   07/31/15 1251 07/31/15 1322  BP:  154/74  Pulse:  67  Temp: 37.6 C 35.9 C  Resp:  14    Complications: No apparent anesthesia complications

## 2015-07-31 NOTE — Consult Note (Signed)
EGD showed no abnormality.  Will prep for a colonoscopy for tomorrow and if that is neg she can go home and do capsule as out patient or do it immediately after the colonoscopy.  Hgb up to 8.

## 2015-07-31 NOTE — Op Note (Signed)
[facility] Gastroenterology Patient Name: Eileen GillsRamona Mack Procedure Date: 07/31/2015 12:38 PM MRN: [id number] Account #: [id number] Date of Birth: February 24, 1936 Admit Type: Inpatient Age: [age] [facility] Gender: Female Note Status: Finalized Procedure:            Upper GI endoscopy Indications:          Iron deficiency anemia secondary to chronic blood loss,                        Melena Providers:            Scot Junobert T. Roe Wilner, MD Referring MD:         Katha HammingSnehalatha Konidena, MD (Referring MD) Medicines:            Propofol per Anesthesia Complications:        No immediate complications. Procedure:            Pre-Anesthesia Assessment:                       - After reviewing the risks and benefits, the patient                        was deemed in satisfactory condition to undergo the                        procedure.                       After obtaining informed consent, the endoscope was                        passed under direct vision. Throughout the procedure,                        the patient's blood pressure, pulse, and oxygen                        saturations were monitored continuously. The Endoscope                        was introduced through the mouth, and advanced to the                        second part of duodenum. The upper GI endoscopy was                        accomplished without difficulty. The patient tolerated                        the procedure well. Findings:      The examined esophagus was normal. No blood present anywhere.      The stomach was normal.      The examined duodenum was normal. There was a patch of mild duodenitis       between duodenal bulb and second portion which showed no blood or       bleeding but could have been a source of bleeding a few days ago. Impression:           - Normal esophagus.                       -  Normal stomach.                       - Normal examined duodenum.                       -  No specimens collected. Recommendation:       - The findings and recommendations were discussed with                        the referring physician. Scot Jun, MD 07/31/2015 1:23:54 PM This report has been signed electronically. Number of Addenda: 0 Note Initiated On: 07/31/2015 12:38 PM      [facility]

## 2015-07-31 NOTE — Anesthesia Procedure Notes (Signed)
Date/Time: 07/31/2015 1:09 PM Performed by: Ginger CarneMICHELET, Eileen Araque Pre-anesthesia Checklist: Patient identified, Emergency Drugs available, Suction available, Patient being monitored and Timeout performed Patient Re-evaluated:Patient Re-evaluated prior to inductionOxygen Delivery Method: Nasal cannula

## 2015-07-31 NOTE — Anesthesia Preprocedure Evaluation (Signed)
Anesthesia Evaluation  Patient identified by MRN, date of birth, ID band Patient awake    Reviewed: Allergy & Precautions, H&P , NPO status , Patient's Chart, lab work & pertinent test results, reviewed documented beta blocker date and time   Airway Mallampati: II   Neck ROM: full    Dental  (+) Poor Dentition, Partial Upper, Partial Lower   Pulmonary neg pulmonary ROS, COPD, Current Smoker,    Pulmonary exam normal        Cardiovascular hypertension, negative cardio ROS Normal cardiovascular examAtrial Fibrillation  Rate:Normal     Neuro/Psych negative neurological ROS  negative psych ROS   GI/Hepatic negative GI ROS, Neg liver ROS, GERD  ,  Endo/Other  negative endocrine ROS  Renal/GU Renal diseasenegative Renal ROS  negative genitourinary   Musculoskeletal   Abdominal   Peds  Hematology negative hematology ROS (+) anemia ,   Anesthesia Other Findings Past Medical History:   Personal history of tobacco use, presenting ha* 07/01/2015     Atrial fibrillation (HCC)                                    History of blood clots                                       Renal disorder                                               Hypertension                                                 Anemia                                                       GERD (gastroesophageal reflux disease)                       COPD (chronic obstructive pulmonary disease) (*            Past Surgical History:   ABDOMINAL HYSTERECTOMY                                        CHOLECYSTECTOMY                                               CARDIAC SURGERY                                             BMI    Body Mass Index   27.77 kg/m 2  Reproductive/Obstetrics negative OB ROS                             Anesthesia Physical Anesthesia Plan  ASA: III and emergent  Anesthesia Plan: General   Post-op Pain Management:     Induction:   Airway Management Planned:   Additional Equipment:   Intra-op Plan:   Post-operative Plan:   Informed Consent: I have reviewed the patients History and Physical, chart, labs and discussed the procedure including the risks, benefits and alternatives for the proposed anesthesia with the patient or authorized representative who has indicated his/her understanding and acceptance.   Dental Advisory Given  Plan Discussed with: CRNA  Anesthesia Plan Comments:         Anesthesia Quick Evaluation

## 2015-07-31 NOTE — Anesthesia Postprocedure Evaluation (Signed)
Anesthesia Post Note  Patient: Eileen Mack  Procedure(s) Performed: Procedure(s) (LRB): ESOPHAGOGASTRODUODENOSCOPY (EGD) (N/A)  Patient location during evaluation: Endoscopy Anesthesia Type: General Level of consciousness: awake and alert Pain management: pain level controlled Vital Signs Assessment: post-procedure vital signs reviewed and stable Respiratory status: spontaneous breathing, nonlabored ventilation, respiratory function stable and patient connected to nasal cannula oxygen Cardiovascular status: blood pressure returned to baseline and stable Postop Assessment: no signs of nausea or vomiting Anesthetic complications: no    Last Vitals:  Filed Vitals:   07/31/15 1404 07/31/15 1413  BP: 166/67 168/69  Pulse: 64 63  Temp:    Resp: 16 17    Last Pain:  Filed Vitals:   07/31/15 1416  PainSc: 0-No pain                 Cleda MccreedyJoseph K Wright Gravely

## 2015-08-01 ENCOUNTER — Inpatient Hospital Stay: Payer: Medicare Other | Admitting: Anesthesiology

## 2015-08-01 ENCOUNTER — Encounter
Admission: EM | Disposition: A | Discharge status: Home / Self Care | Payer: Self-pay | Source: Home / Self Care | Attending: Internal Medicine

## 2015-08-01 HISTORY — PX: COLONOSCOPY WITH PROPOFOL: SHX5780

## 2015-08-01 SURGERY — COLONOSCOPY WITH PROPOFOL
Anesthesia: General | Laterality: Bilateral

## 2015-08-01 MED ORDER — FERROUS SULFATE 325 (65 FE) MG PO TABS: 325.0000 mg | ORAL_TABLET | Freq: Every day | ORAL | Status: AC

## 2015-08-01 MED ORDER — MIDAZOLAM HCL 2 MG/2ML IJ SOLN
INTRAMUSCULAR | Status: DC | PRN
Start: 2015-08-01 — End: 2015-08-01
  Administered 2015-08-01: 1 mg via INTRAVENOUS

## 2015-08-01 MED ORDER — PROPOFOL 10 MG/ML IV BOLUS
INTRAVENOUS | Status: DC | PRN
  Administered 2015-08-01: 50 mg via INTRAVENOUS

## 2015-08-01 MED ORDER — FENTANYL CITRATE (PF) 100 MCG/2ML IJ SOLN
INTRAMUSCULAR | Status: DC | PRN
  Administered 2015-08-01: 50 ug via INTRAVENOUS

## 2015-08-01 MED ORDER — PANTOPRAZOLE SODIUM 40 MG PO TBEC: 40.0000 mg | DELAYED_RELEASE_TABLET | Freq: Every day | ORAL | Status: DC

## 2015-08-01 MED ORDER — THERA VITAL M PO TABS: 1.0000 | ORAL_TABLET | Freq: Every day | ORAL | Status: DC

## 2015-08-01 MED ORDER — PROPOFOL 500 MG/50ML IV EMUL
INTRAVENOUS | Status: DC | PRN
  Administered 2015-08-01: 75 ug/kg/min via INTRAVENOUS

## 2015-08-01 MED ORDER — SODIUM CHLORIDE 0.9 % IV SOLN
INTRAVENOUS | Status: DC
  Administered 2015-08-01: 1000 mL via INTRAVENOUS

## 2015-08-01 MED ORDER — LACTATED RINGERS IV SOLN
INTRAVENOUS | Status: DC | PRN
  Administered 2015-08-01: 08:00:00 via INTRAVENOUS

## 2015-08-01 NOTE — Transfer of Care (Signed)
Immediate Anesthesia Transfer of Care Note  Patient: Eileen Mack  Procedure(s) Performed: Procedure(s): COLONOSCOPY WITH PROPOFOL (Bilateral)  Patient Location: PACU  Anesthesia Type:General  Level of Consciousness: awake  Airway & Oxygen Therapy: Patient Spontanous Breathing and Patient connected to nasal cannula oxygen  Post-op Assessment: Report given to RN  Post vital signs: Reviewed and stable  Last Vitals:  Filed Vitals:   08/01/15 0743 08/01/15 0830  BP: 145/57 146/71  Pulse: 67 85  Temp: 36.6 C 36.7 C  Resp: 18 16    Complications: No apparent anesthesia complications

## 2015-08-01 NOTE — Op Note (Signed)
[facility] Gastroenterology Patient Name: Eileen Mack Procedure Date: 08/01/2015 7:40 AM MRN: [id number] Account #: [id number] Date of Birth: Sep 03, 1935 Admit Type: Inpatient Age: [id number] Room: [facility] ENDO ROOM 4 Gender: Female Note Status: Finalized Procedure:            Colonoscopy Indications:          Iron deficiency anemia secondary to chronic blood loss Providers:            Scot Junobert T. [place] Huberty, MD Referring MD:         Leretha DykesGloria P. Mack (Referring MD) Medicines:            Propofol per Anesthesia Complications:        No immediate complications. Procedure:            Pre-Anesthesia Assessment:                       - After reviewing the risks and benefits, the patient                        was deemed in satisfactory condition to undergo the                        procedure.                       After obtaining informed consent, the colonoscope was                        passed under direct vision. Throughout the procedure,                        the patient's blood pressure, pulse, and oxygen                        saturations were monitored continuously. The                        Colonoscope was introduced through the anus and                        advanced to the the cecum, identified by appendiceal                        orifice and ileocecal valve. The colonoscopy was                        performed without difficulty. The patient tolerated the                        procedure well. The quality of the bowel preparation                        was excellent. Findings:      A few small-mouthed diverticula were found in the sigmoid colon.      Internal hemorrhoids were found during endoscopy. The hemorrhoids were       small and Grade I (internal hemorrhoids that do not prolapse).      Two sessile polyps were found in the rectum. The polyps were diminutive       in size. These polyps were removed with a cold biopsy forceps.  Resection       and  retrieval were complete.      Internal hemorrhoids were found during endoscopy. The hemorrhoids were       small and Grade I (internal hemorrhoids that do not prolapse).      The exam was otherwise without abnormality. Impression:           - Diverticulosis in the sigmoid colon.                       - Internal hemorrhoids.                       - Two diminutive polyps in the rectum, removed with a                        cold biopsy forceps. Resected and retrieved.                       - Internal hemorrhoids.                       - The examination was otherwise normal. Recommendation:       - Await pathology results. Scot Jun, MD 08/01/2015 8:30:53 AM This report has been signed electronically. Number of Addenda: 0 Note Initiated On: 08/01/2015 7:40 AM Scope Withdrawal Time: 0 hours 13 minutes 51 seconds  Total Procedure Duration: 0 hours 19 minutes 54 seconds       [facility]

## 2015-08-01 NOTE — Anesthesia Procedure Notes (Signed)
Performed by: Senora Lacson Oxygen Delivery Method: Nasal cannula     

## 2015-08-01 NOTE — Anesthesia Postprocedure Evaluation (Signed)
Anesthesia Post Note  Patient: Eileen Mack  Procedure(s) Performed: Procedure(s) (LRB): COLONOSCOPY WITH PROPOFOL (Bilateral)  Patient location during evaluation: PACU Anesthesia Type: General Level of consciousness: awake and alert Pain management: pain level controlled Vital Signs Assessment: post-procedure vital signs reviewed and stable Respiratory status: spontaneous breathing, nonlabored ventilation, respiratory function stable and patient connected to nasal cannula oxygen Cardiovascular status: blood pressure returned to baseline and stable Postop Assessment: no signs of nausea or vomiting Anesthetic complications: no    Last Vitals:  Filed Vitals:   08/01/15 0846 08/01/15 0855  BP: 171/70 175/61  Pulse: 67 66  Temp:    Resp: 14 14    Last Pain:  Filed Vitals:   08/01/15 0857  PainSc: 0-No pain                 Yevette EdwardsJames G Maia Handa

## 2015-08-01 NOTE — Progress Notes (Signed)
Pt is alert and oriented x 4, denies pain, colonoscopy performed in am with removal of polyps, pt is d/c to home, pt to continue on omeprazole, pt notes that she already has rx for 40 mg omeprazole at home that she receives via mail order. She is to f/u with pcp and Dr. Markham JordanElliot, reports understanding of d/c instructions and has no further questions at this time.

## 2015-08-01 NOTE — Discharge Summary (Addendum)
[facility] [place] [facility]   PATIENT NAME: Eileen Mack    MR#:  [id number]  DATE OF BIRTH:  [birth year]  DATE OF ADMISSION:  07/29/2015 ADMITTING PHYSICIAN: Katha Hamming, MD  DATE OF DISCHARGE: 08/01/2015  PRIMARY CARE PHYSICIAN: Luvenia Starch PATRICIA, DO    ADMISSION DIAGNOSIS:  Iron deficiency anemia due to chronic blood loss [D50.0] Gastrointestinal hemorrhage, unspecified gastritis, unspecified gastrointestinal hemorrhage type [K92.2]  DISCHARGE DIAGNOSIS:  Active Problems:   Severe anemia   SECONDARY DIAGNOSIS:   Past Medical History  Diagnosis Date  . Personal history of tobacco use, presenting hazards to health 07/01/2015  . Atrial fibrillation (HCC)   . History of blood clots   . Renal disorder   . Hypertension   . Anemia   . GERD (gastroesophageal reflux disease)   . COPD (chronic obstructive pulmonary disease) [facility])     HOSPITAL COURSE:    80 year old female with a history of atrial fibrillation and essential hypertension who presented with significant weakness and found to have significant anemia. For further details please refer the H&P.  1. Acute on chronic anemia: Patient underwent EGD and colonoscopy. Both of these showed no areas of concernFor possible source of bleeding/anemia. She will follow-up as an outpatient with GI for capsule endoscopy to check the small bowel. Due to her significant anemia, Coumadin has been discontinued at this time. She may continue low-dose aspirin. She will also be discharged with ferrous sulfate and ultimately vitamin as per the request of GI physician.  2. History of atrial fibrillation: Patient will continue rate control with Coreg. Coumadin has been stopped for now due to problem #1.  3. Essential hypertension: She will continue clonidine, lisinopril and Coreg.  4. Hypothyroid: She will continue Synthroid.  5. Hyperlipidemia: Continue pravastatin and gemfibrozil.  6.  Anxiety/depression: Continue Effexor or  DISCHARGE CONDITIONS AND DIET:   Stable for discharge on a heart healthy diet.  CONSULTS OBTAINED:  Treatment Team:  Scot Jun, MD  DRUG ALLERGIES:   Allergies  Allergen Reactions  . Iodinated Diagnostic Agents Hives, Itching and Rash    She states she has to be premedicated. SPM  . Sulfa Antibiotics Rash    DISCHARGE MEDICATIONS:   Current Discharge Medication List    START taking these medications   Details  ferrous sulfate 325 (65 FE) MG tablet Take 1 tablet (325 mg total) by mouth daily with breakfast. Qty: 60 tablet, Refills: 3    Multiple Vitamins-Minerals (MULTIVITAMIN) tablet Take 1 tablet by mouth daily. Qty: 90 tablet, Refills: 0    pantoprazole (PROTONIX) 40 MG tablet Take 1 tablet (40 mg total) by mouth daily. Qty: 30 tablet, Refills: 0      CONTINUE these medications which have NOT CHANGED   Details  aspirin EC 81 MG tablet Take 81 mg by mouth daily.    carvedilol (COREG) 12.5 MG tablet Take 12.5 mg by mouth 2 (two) times daily with a meal.    cholestyramine light (PREVALITE) 4 g packet Take 4 g by mouth daily.    clonazePAM (KLONOPIN) 0.5 MG tablet Take 0.5 mg by mouth at bedtime.    cloNIDine (CATAPRES) 0.2 MG tablet Take 0.2 mg by mouth 2 (two) times daily.    diphenhydramine-acetaminophen (TYLENOL PM) 25-500 MG TABS tablet Take 1 tablet by mouth at bedtime.    gabapentin (NEURONTIN) 300 MG capsule Take 600 mg by mouth 2 (two) times daily.    gemfibrozil (LOPID) 600 MG tablet Take 600  mg by mouth 2 (two) times daily before a meal.    levothyroxine (SYNTHROID, LEVOTHROID) 100 MCG tablet Take 100 mcg by mouth daily before breakfast.    lisinopril (PRINIVIL,ZESTRIL) 20 MG tablet Take 20 mg by mouth daily.    nitroGLYCERIN (NITROSTAT) 0.4 MG SL tablet Place 0.4 mg under the tongue every 5 (five) minutes as needed for chest pain.    pravastatin (PRAVACHOL) 40 MG tablet Take 40 mg by mouth at bedtime.     venlafaxine XR (EFFEXOR-XR) 75 MG 24 hr capsule Take 75-150 mg by mouth 2 (two) times daily. Pt. Takes 2 tablets every morning and 1 tablet every evening.      STOP taking these medications     omeprazole (PRILOSEC) 20 MG capsule      warfarin (COUMADIN) 5 MG tablet               Today   CHIEF COMPLAINT:  Patient doing well this morning. Patient denies melena or hematochezia. She denies abdominal pain.   VITAL SIGNS:  Blood pressure 175/61, pulse 66, temperature 97.1 F (36.2 C), temperature source Tympanic, resp. rate 14, height  (1.676 m), weight 78.019 kg (172 lb), SpO2 99 %.   REVIEW OF SYSTEMS:  Review of Systems  Constitutional: Negative for fever, chills and malaise/fatigue.  HENT: Negative for ear discharge, ear pain, hearing loss, nosebleeds and sore throat.   Eyes: Negative for blurred vision and pain.  Respiratory: Negative for cough, hemoptysis, shortness of breath and wheezing.   Cardiovascular: Negative for chest pain, palpitations and leg swelling.  Gastrointestinal: Negative for nausea, vomiting, abdominal pain, diarrhea and blood in stool.  Genitourinary: Negative for dysuria.  Musculoskeletal: Negative for back pain.  Neurological: Negative for dizziness, tremors, speech change, focal weakness, seizures and headaches.  Endo/Heme/Allergies: Does not bruise/bleed easily.  Psychiatric/Behavioral: Negative for depression, suicidal ideas and hallucinations.     PHYSICAL EXAMINATION:  GENERAL:  80 y.o.-year-old patient lying in the bed with no acute distress.  NECK:  Supple, no jugular venous distention. No thyroid enlargement, no tenderness.  LUNGS: Normal breath sounds bilaterally, no wheezing, rales,rhonchi  No use of accessory muscles of respiration.  CARDIOVASCULAR: S1, S2 normal. No murmurs, rubs, or gallops.  ABDOMEN: Soft, non-tender, non-distended. Bowel sounds present. No organomegaly or mass.  EXTREMITIES: No pedal edema, cyanosis,  or clubbing.  PSYCHIATRIC: The patient is alert and oriented x 3.  SKIN: No obvious rash, lesion, or ulcer.   DATA REVIEW:   CBC  Recent Labs Lab 07/31/15 0502  WBC 4.4  HGB 8.6*  HCT 26.1*  PLT 252    Chemistries   Recent Labs Lab 07/29/15 1133  07/31/15 0502  NA 135  < > 139  K 3.4*  < > 3.1*  CL 107  < > 111  CO2 21*  < > 25  GLUCOSE 159*  < > 86  BUN 17  < > 9  CREATININE 1.02*  < > 0.81  CALCIUM 8.4*  < > 8.6*  AST 19  --   --   ALT 10*  --   --   ALKPHOS 89  --   --   BILITOT 0.4  --   --   < > = values in this interval not displayed.  Cardiac Enzymes  Recent Labs Lab 07/29/15 1133  TROPONINI <0.03    Microbiology Results  @  RADIOLOGY:  No results found.    Management plans discussed with the patient and she is in  agreement. Stable for discharge home  Patient should follow up with GI in 1 week  CODE STATUS:     Code Status Orders        Start     Ordered   07/29/15 1140  Full code   Continuous     07/29/15 1141    Code Status History    Date Active Date Inactive Code Status Order ID Comments User Context   This patient has a current code status but no historical code status.    Advance Directive Documentation        Most Recent Value   Type of Advance Directive  Healthcare Power of Attorney   Pre-existing out of facility DNR order (yellow form or pink MOST form)     "MOST" Form in Place?        TOTAL TIME TAKING CARE OF THIS PATIENT: 35 minutes.    Note: This dictation was prepared with Dragon dictation along with smaller phrase technology. Any transcriptional errors that result from this process are unintentional.  Tamyrah Burbage M.D on 08/01/2015 at 11:00 AM  Between 7am to 6pm - Pager - [phone] After 6pm go to [email or web address] - password [email or web address] [facility]  [facility]  Office  [phone]  CC: Primary care physician; Luvenia StarchSANTAYANA, GLORIA PATRICIA, DO

## 2015-08-01 NOTE — Progress Notes (Signed)
I called patient to let her know to stop Coumadin and start multivitamin and iron tablets.

## 2015-08-01 NOTE — Anesthesia Preprocedure Evaluation (Signed)
Anesthesia Evaluation  Patient identified by MRN, date of birth, ID band Patient awake    Reviewed: Allergy & Precautions, H&P , NPO status , Patient's Chart, lab work & pertinent test results, reviewed documented beta blocker date and time   Airway Mallampati: III   Neck ROM: full    Dental  (+) Poor Dentition   Pulmonary neg pulmonary ROS, COPD, Current Smoker,    Pulmonary exam normal        Cardiovascular hypertension, negative cardio ROS Normal cardiovascular exam     Neuro/Psych negative neurological ROS  negative psych ROS   GI/Hepatic negative GI ROS, Neg liver ROS,   Endo/Other  negative endocrine ROS  Renal/GU negative Renal ROS  negative genitourinary   Musculoskeletal negative musculoskeletal ROS (+)   Abdominal   Peds negative pediatric ROS (+)  Hematology negative hematology ROS (+) anemia ,   Anesthesia Other Findings Past Medical History:   Personal history of tobacco use, presenting ha* 07/01/2015     Atrial fibrillation (HCC)                                    History of blood clots                                       Renal disorder                                               Hypertension                                                 Anemia                                                       GERD (gastroesophageal reflux disease)                       COPD (chronic obstructive pulmonary disease) (*            Past Surgical History:   ABDOMINAL HYSTERECTOMY                                        CHOLECYSTECTOMY                                               CARDIAC SURGERY                                             BMI    Body Mass Index   27.77 kg/m 2  Reproductive/Obstetrics negative OB ROS                             Anesthesia Physical Anesthesia Plan  ASA: III and emergent  Anesthesia Plan: General   Post-op Pain Management:    Induction:    Airway Management Planned:   Additional Equipment:   Intra-op Plan:   Post-operative Plan:   Informed Consent: I have reviewed the patients History and Physical, chart, labs and discussed the procedure including the risks, benefits and alternatives for the proposed anesthesia with the patient or authorized representative who has indicated his/her understanding and acceptance.   Dental Advisory Given  Plan Discussed with: CRNA  Anesthesia Plan Comments:         Anesthesia Quick Evaluation

## 2015-08-01 NOTE — Consult Note (Signed)
Colonoscopy today and EGD yesterday showed no areas of concern for possible source of bleeding/anemia.  She can go home today and follow with us in office and we will set up capsule endoscopy to check small bowel.  Please give her iron to take and multivits.

## 2015-08-03 ENCOUNTER — Encounter: Payer: Self-pay | Admitting: Unknown Physician Specialty

## 2015-08-04 LAB — SURGICAL PATHOLOGY

## 2015-09-16 ENCOUNTER — Other Ambulatory Visit: Payer: Self-pay | Admitting: Unknown Physician Specialty

## 2015-09-16 DIAGNOSIS — R1084 Generalized abdominal pain: Secondary | ICD-10-CM

## 2015-09-16 DIAGNOSIS — R140 Abdominal distension (gaseous): Secondary | ICD-10-CM

## 2015-09-29 ENCOUNTER — Ambulatory Visit: Payer: Medicare Other

## 2015-10-02 ENCOUNTER — Ambulatory Visit
Admission: RE | Admit: 2015-10-02 | Discharge: 2015-10-02 | Disposition: A | Discharge status: Home / Self Care | Payer: Medicare Other | Source: Ambulatory Visit | Attending: Unknown Physician Specialty | Admitting: Unknown Physician Specialty

## 2015-10-02 ENCOUNTER — Other Ambulatory Visit: Payer: Medicare Other

## 2015-10-02 DIAGNOSIS — R1084 Generalized abdominal pain: Secondary | ICD-10-CM | POA: Diagnosis present

## 2015-10-02 DIAGNOSIS — R140 Abdominal distension (gaseous): Secondary | ICD-10-CM | POA: Diagnosis present

## 2015-10-02 MED ORDER — TECHNETIUM TC 99M SULFUR COLLOID
2.0000 | Freq: Once | INTRAVENOUS | Status: AC | PRN
  Administered 2015-10-02: 2.18 via INTRAVENOUS

## 2015-10-06 ENCOUNTER — Other Ambulatory Visit: Payer: Medicare Other

## 2016-03-01 ENCOUNTER — Telehealth: Payer: Self-pay | Admitting: *Deleted

## 2016-03-01 NOTE — Telephone Encounter (Signed)
Notified patient that lung cancer screening follow up CT scan is due. Confirmed that patient is within the age range of 55-77, and asymptomatic, (no signs or symptoms of lung cancer). Patient denies illness that would prevent curative treatment for lung cancer if found. The patient is a current smoker, with a 60 pack year history. The shared decision making visit was done 07/03/15. Patient is agreeable for CT scan being scheduled.

## 2016-03-29 ENCOUNTER — Other Ambulatory Visit: Payer: Self-pay | Admitting: *Deleted

## 2016-03-29 DIAGNOSIS — Z87891 Personal history of nicotine dependence: Secondary | ICD-10-CM

## 2016-03-29 DIAGNOSIS — R9389 Abnormal findings on diagnostic imaging of other specified body structures: Secondary | ICD-10-CM

## 2016-04-01 ENCOUNTER — Ambulatory Visit
Admission: RE | Admit: 2016-04-01 | Discharge: 2016-04-01 | Disposition: A | Discharge status: Home / Self Care | Payer: Medicare Other | Source: Ambulatory Visit | Attending: Oncology | Admitting: Oncology

## 2016-04-01 DIAGNOSIS — I700 Atherosclerosis of aorta: Secondary | ICD-10-CM | POA: Diagnosis not present

## 2016-04-01 DIAGNOSIS — Z87891 Personal history of nicotine dependence: Secondary | ICD-10-CM | POA: Insufficient documentation

## 2016-04-01 DIAGNOSIS — R9389 Abnormal findings on diagnostic imaging of other specified body structures: Secondary | ICD-10-CM

## 2016-04-01 DIAGNOSIS — J432 Centrilobular emphysema: Secondary | ICD-10-CM | POA: Diagnosis not present

## 2016-04-01 DIAGNOSIS — I2510 Atherosclerotic heart disease of native coronary artery without angina pectoris: Secondary | ICD-10-CM | POA: Diagnosis not present

## 2016-04-01 DIAGNOSIS — R918 Other nonspecific abnormal finding of lung field: Secondary | ICD-10-CM | POA: Insufficient documentation

## 2016-04-08 ENCOUNTER — Encounter: Payer: Self-pay | Admitting: *Deleted

## 2016-04-29 DIAGNOSIS — R918 Other nonspecific abnormal finding of lung field: Secondary | ICD-10-CM | POA: Insufficient documentation

## 2017-08-14 ENCOUNTER — Other Ambulatory Visit: Payer: Self-pay | Admitting: Internal Medicine

## 2017-08-14 DIAGNOSIS — I714 Abdominal aortic aneurysm, without rupture, unspecified: Secondary | ICD-10-CM | POA: Diagnosis present

## 2017-08-14 DIAGNOSIS — Z8719 Personal history of other diseases of the digestive system: Secondary | ICD-10-CM | POA: Insufficient documentation

## 2017-08-14 DIAGNOSIS — G629 Polyneuropathy, unspecified: Secondary | ICD-10-CM | POA: Insufficient documentation

## 2017-08-14 DIAGNOSIS — Z720 Tobacco use: Secondary | ICD-10-CM | POA: Insufficient documentation

## 2017-08-14 DIAGNOSIS — F329 Major depressive disorder, single episode, unspecified: Secondary | ICD-10-CM | POA: Insufficient documentation

## 2017-08-14 DIAGNOSIS — R911 Solitary pulmonary nodule: Secondary | ICD-10-CM

## 2017-08-14 DIAGNOSIS — F32A Depression, unspecified: Secondary | ICD-10-CM | POA: Insufficient documentation

## 2017-08-14 DIAGNOSIS — R197 Diarrhea, unspecified: Secondary | ICD-10-CM | POA: Insufficient documentation

## 2017-08-14 DIAGNOSIS — M5090 Cervical disc disorder, unspecified, unspecified cervical region: Secondary | ICD-10-CM | POA: Insufficient documentation

## 2017-08-14 DIAGNOSIS — E782 Mixed hyperlipidemia: Secondary | ICD-10-CM | POA: Insufficient documentation

## 2017-08-22 ENCOUNTER — Other Ambulatory Visit: Payer: Self-pay | Admitting: Internal Medicine

## 2017-08-22 DIAGNOSIS — I714 Abdominal aortic aneurysm, without rupture, unspecified: Secondary | ICD-10-CM

## 2017-08-25 ENCOUNTER — Ambulatory Visit
Admission: RE | Admit: 2017-08-25 | Discharge: 2017-08-25 | Disposition: A | Discharge status: Home / Self Care | Payer: Medicare HMO | Source: Ambulatory Visit | Attending: Internal Medicine | Admitting: Internal Medicine

## 2017-08-25 DIAGNOSIS — I714 Abdominal aortic aneurysm, without rupture, unspecified: Secondary | ICD-10-CM

## 2017-10-10 ENCOUNTER — Encounter (INDEPENDENT_AMBULATORY_CARE_PROVIDER_SITE_OTHER): Payer: Self-pay | Admitting: Vascular Surgery

## 2017-10-10 ENCOUNTER — Ambulatory Visit (INDEPENDENT_AMBULATORY_CARE_PROVIDER_SITE_OTHER): Payer: Medicare HMO | Admitting: Vascular Surgery

## 2017-10-10 VITALS — BP 153/77 | HR 71 | Resp 15 | Ht 67.0 in | Wt 165.0 lb

## 2017-10-10 DIAGNOSIS — I714 Abdominal aortic aneurysm, without rupture, unspecified: Secondary | ICD-10-CM

## 2017-10-10 DIAGNOSIS — M79605 Pain in left leg: Secondary | ICD-10-CM

## 2017-10-10 DIAGNOSIS — I83813 Varicose veins of bilateral lower extremities with pain: Secondary | ICD-10-CM | POA: Insufficient documentation

## 2017-10-10 DIAGNOSIS — I10 Essential (primary) hypertension: Secondary | ICD-10-CM

## 2017-10-10 DIAGNOSIS — E785 Hyperlipidemia, unspecified: Secondary | ICD-10-CM

## 2017-10-10 DIAGNOSIS — M79604 Pain in right leg: Secondary | ICD-10-CM

## 2017-10-10 DIAGNOSIS — I739 Peripheral vascular disease, unspecified: Secondary | ICD-10-CM | POA: Diagnosis not present

## 2017-10-10 NOTE — Progress Notes (Signed)
Subjective:    Patient ID: Eileen Mack, female    DOB: 03-19-1936, 82 y.o.   MRN: [id number] Chief Complaint  Patient presents with  . New Patient (Initial Visit)    AAA consult   Presents as a new patient referred by Dr. Letitia Libra for evaluation of a known AAA.  The patient endorses a history of "having a AAA" for many years which has stayed "the same".  The patient denies any abdominal pain, back pain or thrombosis to the bilateral lower extremity.  A recent ultrasound of the abdomen was notable for a 4.6 cm infrarenal AAA. Right iliac artery: 1.2 cm, Left iliac artery: 1.1 cm.  The patient has been experiencing claudication-like symptoms to the bilateral calves when walking long distances.  The patient notes that this discomfort will stop when she ceases the activity however it starts again when she engages in the activity.  The patient is also experiencing progressively worsening pain along her diffuse varicosities located to the bilateral legs.  Over the past few years, the patient has noticed an increase in number, size and discomfort associated with her varicosities.  At this time, patient does not engage in conservative therapy including wearing medical grade 1 compression socks and elevating her legs on a daily basis.  The patient denies any rest pain or ulceration to the bilateral lower examinee.  She denies any significant swelling to the bilateral lower extremity.  The patient denies any recent surgery or trauma to the bilateral lower examinee.  Patient denies any DVT history to bilateral lower extremity.  Patient denies any fever, nausea vomiting.  Review of Systems  Constitutional: Negative.   HENT: Negative.   Eyes: Negative.   Respiratory: Negative.   Cardiovascular:       Claudication AAA Painfully varicose veins  Gastrointestinal: Negative.   Endocrine: Negative.   Genitourinary: Negative.   Musculoskeletal: Negative.   Skin: Negative.   Allergic/Immunologic:  Negative.   Neurological: Negative.   Hematological: Negative.   Psychiatric/Behavioral: Negative.       Objective:   Physical Exam  Constitutional: She is oriented to person, place, and time. She appears well-developed and well-nourished. No distress.  HENT:  Head: Normocephalic and atraumatic.  Right Ear: External ear normal.  Left Ear: External ear normal.  Eyes: Pupils are equal, round, and reactive to light. Conjunctivae and EOM are normal.  Neck: Normal range of motion.  Cardiovascular: Normal rate, regular rhythm, normal heart sounds and intact distal pulses.  Pulses:      Radial pulses are 2+ on the right side, and 2+ on the left side.       Dorsalis pedis pulses are 1+ on the right side, and 1+ on the left side.       Posterior tibial pulses are 1+ on the right side, and 1+ on the left side.  Pulmonary/Chest: Effort normal and breath sounds normal.  Abdominal: Soft. Bowel sounds are normal. She exhibits no distension. There is no tenderness. There is no guarding.  Musculoskeletal: Normal range of motion. She exhibits no edema.  Neurological: She is alert and oriented to person, place, and time.  Skin: She is not diaphoretic.  Less than 1 cm diffuse varicosities noted to the bilateral lower extremity.  There is no stasis dermatitis, fibrosis cellulitis or active ulcerations to the bilateral lower extremity.  Psychiatric: She has a normal mood and affect. Her behavior is normal. Judgment and thought content normal.  Vitals reviewed.  BP (!) 153/77 (BP Location:  Right Arm, Patient Position: Sitting)   Pulse 71   Resp 15   Ht 5\' 7"  (1.702 m)   Wt 165 lb (74.8 kg)   BMI 25.84 kg/m   Past Medical History:  Diagnosis Date  . Anemia   . Atrial fibrillation (HCC)   . COPD (chronic obstructive pulmonary disease) (HCC)   . GERD (gastroesophageal reflux disease)   . History of blood clots   . Hypertension   . Personal history of tobacco use, presenting hazards to health  07/01/2015  . Renal disorder    Social History   Socioeconomic History  . Marital status: Widowed    Spouse name: Not on file  . Number of children: Not on file  . Years of education: Not on file  . Highest education level: Not on file  Occupational History  . Not on file  Social Needs  . Financial resource strain: Not on file  . Food insecurity:    Worry: Not on file    Inability: Not on file  . Transportation needs:    Medical: Not on file    Non-medical: Not on file  Tobacco Use  . Smoking status: Current Every Day Smoker    Packs/day: 1.00    Years: 60.00    Pack years: 60.00  . Smokeless tobacco: Never Used  Substance and Sexual Activity  . Alcohol use: Not on file  . Drug use: Not on file  . Sexual activity: Not on file  Lifestyle  . Physical activity:    Days per week: Not on file    Minutes per session: Not on file  . Stress: Not on file  Relationships  . Social connections:    Talks on phone: Not on file    Gets together: Not on file    Attends religious service: Not on file    Active member of club or organization: Not on file    Attends meetings of clubs or organizations: Not on file    Relationship status: Not on file  . Intimate partner violence:    Fear of current or ex partner: Not on file    Emotionally abused: Not on file    Physically abused: Not on file    Forced sexual activity: Not on file  Other Topics Concern  . Not on file  Social History Narrative  . Not on file   Past Surgical History:  Procedure Laterality Date  . ABDOMINAL HYSTERECTOMY    . CARDIAC SURGERY    . CHOLECYSTECTOMY    . COLONOSCOPY WITH PROPOFOL Bilateral 08/01/2015   Procedure: COLONOSCOPY WITH PROPOFOL;  Surgeon: Scot Jun, MD;  Location: [facility] ENDOSCOPY;  Service: Endoscopy;  Laterality: Bilateral;  . ESOPHAGOGASTRODUODENOSCOPY N/A 07/31/2015   Procedure: ESOPHAGOGASTRODUODENOSCOPY (EGD);  Surgeon: Scot Jun, MD;  Location: [facility] ENDOSCOPY;  Service:  Endoscopy;  Laterality: N/A;   History reviewed. No pertinent family history.  Allergies  Allergen Reactions  . Iodinated Diagnostic Agents Hives, Itching and Rash    She states she has to be premedicated. SPM  . Sulfa Antibiotics Rash      Assessment & Plan:  Presents as a new patient referred by Dr. Letitia Libra for evaluation of a known AAA.  The patient endorses a history of "having a AAA" for many years which has stayed "the same".  The patient denies any abdominal pain, back pain or thrombosis to the bilateral lower extremity.  A recent ultrasound of the abdomen was notable for a 4.6 cm infrarenal  AAA. Right iliac artery: 1.2 cm, Left iliac artery: 1.1 cm.  The patient has been experiencing claudication-like symptoms to the bilateral calves when walking long distances.  The patient notes that this discomfort will stop when she ceases the activity however it starts again when she engages in the activity.  The patient is also experiencing progressively worsening pain along her diffuse varicosities located to the bilateral legs.  Over the past few years, the patient has noticed an increase in number, size and discomfort associated with her varicosities.  At this time, patient does not engage in conservative therapy including wearing medical grade 1 compression socks and elevating her legs on a daily basis.  The patient denies any rest pain or ulceration to the bilateral lower examinee.  She denies any significant swelling to the bilateral lower extremity.  The patient denies any recent surgery or trauma to the bilateral lower examinee.  Patient denies any DVT history to bilateral lower extremity.  Patient denies any fever, nausea vomiting.  1. AAA (abdominal aortic aneurysm) without rupture [facility]) - New Patient with a known AAA now measuring 4.6 cm on recent duplex  Patient presents asymptomatically and denies any stomach pain, back pain or thrombosis to the bilateral lower extremity The patient has  an asymptomatic abdominal aortic aneurysm that is greater than 4cm in maximal diameter.  I have reviewed the natural history of abdominal aortic aneurysm and the small risk of rupture for aneurysm less than 5 cm in size.  However, as these small aneurysms tend to enlarge over time, continued surveillance with ultrasound or CT scan is mandatory every six months.  The patient's blood pressure is being adequately controlled however I have reviewed the importance of hypertension and lipid control and the importance of continuing his abstinence from tobacco.  The patient is also encouraged to exercise a minimum of 30 minutes 4 times a week.  Should the patient develop new onset abdominal or back pain or signs of peripheral embolization they are instructed to seek medical attention immediately and to alert the physician providing care that they have an aneurysm.  The patient voices their understanding.  - VAS [place] AAA DUPLEX; Future  2. Essential hypertension - Stable Encouraged good control as its slows the progression of atherosclerotic disease  3. Hyperlipidemia, unspecified hyperlipidemia type - Stable Encouraged good control as its slows the progression of atherosclerotic disease  4. Varicose veins of both lower extremities with pain - New The patient was encouraged to wear graduated compression stockings (20-30 mmHg) on a daily basis. The patient was instructed to begin wearing the stockings first thing in the morning and removing them in the evening. The patient was instructed specifically not to sleep in the stockings. Prescription given.  In addition, behavioral modification including elevation during the day will be initiated. Anti-inflammatories for pain. The patient will follow up in three months to asses conservative management.  Information on chronic venous insufficiency and compression stockings was given to the patient. The patient was instructed to call the office in the interim if any  worsening edema or ulcerations to the legs, feet or toes occurs. The patient expresses their understanding.  - VAS [place] LOWER EXTREMITY VENOUS REFLUX; Future  5. Claudication [facility]) - New Patient with multiple risk factors for peripheral artery disease Claudication-like symptoms when walking long distances Faint pedal pulses on exam We will bring the patient back and have her undergo an ABI to assess for any contributing peripheral artery disease I have discussed with  the patient at length the risk factors for and pathogenesis of atherosclerotic disease and encouraged a healthy diet, regular exercise regimen and blood pressure / glucose control.  The patient was encouraged to call the office in the interim if he experiences any claudication like symptoms, rest pain or ulcers to his feet / toes.  - VAS US ABI WITH/WO TBI; Future  Current Outpatient Medications on File Prior to Visit  Medication Sig Dispense Refill  . aspirin EC 81 MG tablet Take 81 mg by mouth daily.    . carvedilol (COREG) 12.5 MG tablet Take 12.5 mg by mouth 2 (two) times daily with a meal.    . cholestyramine light (PREVALITE) 4 g packet Take 4 g by mouth daily.    . clonazePAM (KLONOPIN) 0.5 MG tablet Take 0.5 mg by mouth at bedtime.    . cloNIDine (CATAPRES) 0.2 MG tablet Take 0.2 mg by mouth 2 (two) times daily.    . diphenhydramine-acetaminophen (TYLENOL PM) 25-500 MG TABS tablet Take 1 tablet by mouth at bedtime.    . ferrous sulfate 325 (65 FE) MG tablet Take 1 tablet (325 mg total) by mouth daily with breakfast. 60 tablet 3  . gabapentin (NEURONTIN) 300 MG capsule Take 600 mg by mouth 2 (two) times daily.    Marland Kitchen. gemfibrozil (LOPID) 600 MG tablet Take 600 mg by mouth 2 (two) times daily before a meal.    . levothyroxine (SYNTHROID, LEVOTHROID) 100 MCG tablet Take 100 mcg by mouth daily before breakfast.    . lisinopril (PRINIVIL,ZESTRIL) 20 MG tablet Take 20 mg by mouth daily.    . Multiple Vitamins-Minerals  (MULTIVITAMIN) tablet Take 1 tablet by mouth daily. 90 tablet 0  . nitroGLYCERIN (NITROSTAT) 0.4 MG SL tablet Place 0.4 mg under the tongue every 5 (five) minutes as needed for chest pain.    Marland Kitchen. omeprazole (PRILOSEC) 20 MG capsule Take by mouth.    . pantoprazole (PROTONIX) 40 MG tablet Take 1 tablet (40 mg total) by mouth daily. 30 tablet 0  . pravastatin (PRAVACHOL) 40 MG tablet Take 40 mg by mouth at bedtime.    . ranitidine (ZANTAC) 150 MG capsule   0  . venlafaxine XR (EFFEXOR-XR) 75 MG 24 hr capsule Take 75-150 mg by mouth 2 (two) times daily. Pt. Takes 2 tablets every morning and 1 tablet every evening.     No current facility-administered medications on file prior to visit.    There are no Patient Instructions on file for this visit. No follow-ups on file.  Orlin Kann A Spero Gunnels, PA-C

## 2017-10-17 ENCOUNTER — Other Ambulatory Visit: Payer: Self-pay | Admitting: Cardiology

## 2017-10-17 DIAGNOSIS — R918 Other nonspecific abnormal finding of lung field: Secondary | ICD-10-CM

## 2017-10-20 ENCOUNTER — Other Ambulatory Visit: Payer: Self-pay | Admitting: Family Medicine

## 2017-10-20 DIAGNOSIS — R918 Other nonspecific abnormal finding of lung field: Secondary | ICD-10-CM

## 2018-02-01 ENCOUNTER — Other Ambulatory Visit: Payer: Self-pay | Admitting: Family Medicine

## 2018-02-01 DIAGNOSIS — R918 Other nonspecific abnormal finding of lung field: Secondary | ICD-10-CM

## 2018-02-19 ENCOUNTER — Encounter: Payer: Self-pay | Admitting: Emergency Medicine

## 2018-02-19 ENCOUNTER — Emergency Department: Payer: Medicare HMO

## 2018-02-19 ENCOUNTER — Inpatient Hospital Stay
Admission: EM | Admit: 2018-02-19 | Discharge: 2018-02-22 | DRG: 269 | Disposition: A | Discharge status: Home / Self Care | Payer: Medicare HMO | Attending: Internal Medicine | Admitting: Internal Medicine

## 2018-02-19 ENCOUNTER — Other Ambulatory Visit: Payer: Self-pay

## 2018-02-19 DIAGNOSIS — Z86718 Personal history of other venous thrombosis and embolism: Secondary | ICD-10-CM | POA: Diagnosis not present

## 2018-02-19 DIAGNOSIS — Z955 Presence of coronary angioplasty implant and graft: Secondary | ICD-10-CM | POA: Diagnosis not present

## 2018-02-19 DIAGNOSIS — I2510 Atherosclerotic heart disease of native coronary artery without angina pectoris: Secondary | ICD-10-CM | POA: Diagnosis present

## 2018-02-19 DIAGNOSIS — I169 Hypertensive crisis, unspecified: Secondary | ICD-10-CM | POA: Diagnosis present

## 2018-02-19 DIAGNOSIS — Z66 Do not resuscitate: Secondary | ICD-10-CM | POA: Diagnosis present

## 2018-02-19 DIAGNOSIS — Z7982 Long term (current) use of aspirin: Secondary | ICD-10-CM | POA: Diagnosis not present

## 2018-02-19 DIAGNOSIS — Z79899 Other long term (current) drug therapy: Secondary | ICD-10-CM | POA: Diagnosis not present

## 2018-02-19 DIAGNOSIS — Z23 Encounter for immunization: Secondary | ICD-10-CM | POA: Diagnosis present

## 2018-02-19 DIAGNOSIS — I480 Paroxysmal atrial fibrillation: Secondary | ICD-10-CM | POA: Diagnosis present

## 2018-02-19 DIAGNOSIS — Z9049 Acquired absence of other specified parts of digestive tract: Secondary | ICD-10-CM

## 2018-02-19 DIAGNOSIS — M47816 Spondylosis without myelopathy or radiculopathy, lumbar region: Secondary | ICD-10-CM | POA: Diagnosis present

## 2018-02-19 DIAGNOSIS — M5136 Other intervertebral disc degeneration, lumbar region: Secondary | ICD-10-CM

## 2018-02-19 DIAGNOSIS — E039 Hypothyroidism, unspecified: Secondary | ICD-10-CM | POA: Diagnosis present

## 2018-02-19 DIAGNOSIS — Z90710 Acquired absence of both cervix and uterus: Secondary | ICD-10-CM | POA: Diagnosis not present

## 2018-02-19 DIAGNOSIS — Z882 Allergy status to sulfonamides status: Secondary | ICD-10-CM

## 2018-02-19 DIAGNOSIS — K219 Gastro-esophageal reflux disease without esophagitis: Secondary | ICD-10-CM | POA: Diagnosis present

## 2018-02-19 DIAGNOSIS — I714 Abdominal aortic aneurysm, without rupture, unspecified: Secondary | ICD-10-CM

## 2018-02-19 DIAGNOSIS — Z79890 Hormone replacement therapy (postmenopausal): Secondary | ICD-10-CM

## 2018-02-19 DIAGNOSIS — I10 Essential (primary) hypertension: Secondary | ICD-10-CM | POA: Diagnosis present

## 2018-02-19 DIAGNOSIS — I160 Hypertensive urgency: Secondary | ICD-10-CM

## 2018-02-19 DIAGNOSIS — I447 Left bundle-branch block, unspecified: Secondary | ICD-10-CM | POA: Diagnosis present

## 2018-02-19 DIAGNOSIS — R109 Unspecified abdominal pain: Secondary | ICD-10-CM

## 2018-02-19 DIAGNOSIS — F17210 Nicotine dependence, cigarettes, uncomplicated: Secondary | ICD-10-CM

## 2018-02-19 DIAGNOSIS — Z8679 Personal history of other diseases of the circulatory system: Secondary | ICD-10-CM

## 2018-02-19 DIAGNOSIS — E785 Hyperlipidemia, unspecified: Secondary | ICD-10-CM | POA: Diagnosis present

## 2018-02-19 DIAGNOSIS — J449 Chronic obstructive pulmonary disease, unspecified: Secondary | ICD-10-CM

## 2018-02-19 DIAGNOSIS — Z91041 Radiographic dye allergy status: Secondary | ICD-10-CM

## 2018-02-19 LAB — URINALYSIS, COMPLETE (UACMP) WITH MICROSCOPIC
Bacteria, UA: NONE SEEN
Bilirubin Urine: NEGATIVE
Glucose, UA: NEGATIVE mg/dL
Hgb urine dipstick: NEGATIVE
Ketones, ur: NEGATIVE mg/dL
Leukocytes, UA: NEGATIVE
Nitrite: NEGATIVE
Protein, ur: NEGATIVE mg/dL
Specific Gravity, Urine: >1.046 — ABNORMAL HIGH (ref 1.005–1.030)
pH: 5 (ref 5.0–8.0)

## 2018-02-19 LAB — COMPREHENSIVE METABOLIC PANEL
ALT: 11 U/L (ref 0–44)
AST: 17 U/L (ref 15–41)
Albumin: 3.9 g/dL (ref 3.5–5.0)
Alkaline Phosphatase: 87 U/L (ref 38–126)
Anion gap: 7 (ref 5–15)
BUN: 19 mg/dL (ref 8–23)
CO2: 27 mmol/L (ref 22–32)
Calcium: 9.5 mg/dL (ref 8.9–10.3)
Chloride: 106 mmol/L (ref 98–111)
Creatinine, Ser: 0.94 mg/dL (ref 0.44–1.00)
GFR calc Af Amer: >60 mL/min (ref >60)
GFR calc non Af Amer: 56 mL/min — ABNORMAL LOW (ref >60)
Glucose, Bld: 96 mg/dL (ref 70–99)
Potassium: 3.6 mmol/L (ref 3.5–5.1)
Sodium: 140 mmol/L (ref 135–145)
Total Bilirubin: 0.5 mg/dL (ref 0.3–1.2)
Total Protein: 7.4 g/dL (ref 6.5–8.1)

## 2018-02-19 LAB — CBC WITH DIFFERENTIAL/PLATELET
Abs Immature Granulocytes: 0.02 10*3/uL (ref 0.00–0.07)
Basophils Absolute: 0.1 10*3/uL (ref 0.0–0.1)
Basophils Relative: 1 %
Eosinophils Absolute: 0.3 10*3/uL (ref 0.0–0.5)
Eosinophils Relative: 5 %
HCT: 38.7 % (ref 36.0–46.0)
Hemoglobin: 12.5 g/dL (ref 12.0–15.0)
Immature Granulocytes: 0 %
Lymphocytes Relative: 26 %
Lymphs Abs: 1.5 10*3/uL (ref 0.7–4.0)
MCH: 30.9 pg (ref 26.0–34.0)
MCHC: 32.3 g/dL (ref 30.0–36.0)
MCV: 95.6 fL (ref 80.0–100.0)
Monocytes Absolute: 0.4 10*3/uL (ref 0.1–1.0)
Monocytes Relative: 7 %
Neutro Abs: 3.4 10*3/uL (ref 1.7–7.7)
Neutrophils Relative %: 61 %
Platelets: 311 10*3/uL (ref 150–400)
RBC: 4.05 MIL/uL (ref 3.87–5.11)
RDW: 13.2 % (ref 11.5–15.5)
WBC: 5.6 10*3/uL (ref 4.0–10.5)
nRBC: 0 % (ref 0.0–0.2)

## 2018-02-19 LAB — TYPE AND SCREEN
ABO/RH(D): A POS
Antibody Screen: NEGATIVE

## 2018-02-19 MED ORDER — ONDANSETRON HCL 4 MG PO TABS: 4.0000 mg | ORAL_TABLET | Freq: Four times a day (QID) | ORAL | Status: DC | PRN

## 2018-02-19 MED ORDER — HYDRALAZINE HCL 20 MG/ML IJ SOLN
INTRAMUSCULAR | Status: AC
  Filled 2018-02-19: qty 1

## 2018-02-19 MED ORDER — ONDANSETRON HCL 4 MG/2ML IJ SOLN: 4.0000 mg | Freq: Four times a day (QID) | INTRAMUSCULAR | Status: DC | PRN

## 2018-02-19 MED ORDER — FERROUS SULFATE 325 (65 FE) MG PO TABS
325.0000 mg | ORAL_TABLET | Freq: Every day | ORAL | Status: DC
  Administered 2018-02-20 – 2018-02-22 (×2): 325 mg via ORAL
  Filled 2018-02-19 (×3): qty 1

## 2018-02-19 MED ORDER — GABAPENTIN 300 MG PO CAPS
600.0000 mg | ORAL_CAPSULE | Freq: Two times a day (BID) | ORAL | Status: DC
  Administered 2018-02-19 – 2018-02-22 (×5): 600 mg via ORAL
  Filled 2018-02-19 (×5): qty 2

## 2018-02-19 MED ORDER — SENNA 8.6 MG PO TABS
1.0000 | ORAL_TABLET | Freq: Two times a day (BID) | ORAL | Status: DC
  Administered 2018-02-20: 8.6 mg via ORAL
  Filled 2018-02-19 (×5): qty 1

## 2018-02-19 MED ORDER — DIPHENHYDRAMINE HCL 50 MG/ML IJ SOLN
50.0000 mg | Freq: Once | INTRAMUSCULAR | Status: AC
  Administered 2018-02-19: 50 mg via INTRAVENOUS

## 2018-02-19 MED ORDER — IOPAMIDOL (ISOVUE-370) INJECTION 76%: 100.0000 mL | Freq: Once | INTRAVENOUS | Status: DC | PRN

## 2018-02-19 MED ORDER — MORPHINE SULFATE (PF) 4 MG/ML IV SOLN
4.0000 mg | INTRAVENOUS | Status: DC | PRN
  Administered 2018-02-19: 4 mg via INTRAVENOUS
  Filled 2018-02-19: qty 1

## 2018-02-19 MED ORDER — HYDROMORPHONE HCL 1 MG/ML IJ SOLN: 0.5000 mg | Freq: Once | INTRAMUSCULAR | Status: DC

## 2018-02-19 MED ORDER — ONDANSETRON HCL 4 MG/2ML IJ SOLN
4.0000 mg | Freq: Once | INTRAMUSCULAR | Status: AC
  Administered 2018-02-19: 4 mg via INTRAVENOUS
  Filled 2018-02-19: qty 2

## 2018-02-19 MED ORDER — ENOXAPARIN SODIUM 40 MG/0.4ML ~~LOC~~ SOLN
40.0000 mg | SUBCUTANEOUS | Status: DC
  Administered 2018-02-19 – 2018-02-21 (×3): 40 mg via SUBCUTANEOUS
  Filled 2018-02-19 (×3): qty 0.4

## 2018-02-19 MED ORDER — CLONAZEPAM 0.5 MG PO TABS
0.5000 mg | ORAL_TABLET | Freq: Every day | ORAL | Status: DC
  Administered 2018-02-19 – 2018-02-21 (×3): 0.5 mg via ORAL
  Filled 2018-02-19 (×3): qty 1

## 2018-02-19 MED ORDER — SODIUM CHLORIDE 0.9% FLUSH
3.0000 mL | Freq: Two times a day (BID) | INTRAVENOUS | Status: DC
  Administered 2018-02-19 – 2018-02-21 (×4): 3 mL via INTRAVENOUS

## 2018-02-19 MED ORDER — HYDRALAZINE HCL 20 MG/ML IJ SOLN
5.0000 mg | Freq: Once | INTRAMUSCULAR | Status: AC
  Administered 2018-02-19: 5 mg via INTRAVENOUS

## 2018-02-19 MED ORDER — CARVEDILOL 12.5 MG PO TABS
12.5000 mg | ORAL_TABLET | Freq: Two times a day (BID) | ORAL | Status: DC
  Administered 2018-02-20 – 2018-02-22 (×5): 12.5 mg via ORAL
  Filled 2018-02-19 (×5): qty 1

## 2018-02-19 MED ORDER — NICARDIPINE HCL IN NACL 20-0.86 MG/200ML-% IV SOLN
3.0000 mg/h | Freq: Once | INTRAVENOUS | Status: AC
  Administered 2018-02-19: 5 mg/h via INTRAVENOUS
  Filled 2018-02-19: qty 200

## 2018-02-19 MED ORDER — GEMFIBROZIL 600 MG PO TABS
600.0000 mg | ORAL_TABLET | Freq: Two times a day (BID) | ORAL | Status: DC
  Administered 2018-02-20 – 2018-02-22 (×5): 600 mg via ORAL
  Filled 2018-02-19 (×6): qty 1

## 2018-02-19 MED ORDER — DIPHENHYDRAMINE HCL 25 MG PO CAPS
ORAL_CAPSULE | ORAL | Status: AC
  Filled 2018-02-19: qty 2

## 2018-02-19 MED ORDER — IOPAMIDOL (ISOVUE-370) INJECTION 76%
75.0000 mL | Freq: Once | INTRAVENOUS | Status: AC | PRN
  Administered 2018-02-19: 75 mL via INTRAVENOUS

## 2018-02-19 MED ORDER — HYDROCODONE-ACETAMINOPHEN 5-325 MG PO TABS
1.0000 | ORAL_TABLET | ORAL | Status: DC | PRN
  Administered 2018-02-20 – 2018-02-21 (×2): 2 via ORAL
  Filled 2018-02-19 (×2): qty 2

## 2018-02-19 MED ORDER — HYDROCORTISONE NA SUCCINATE PF 250 MG IJ SOLR
200.0000 mg | Freq: Once | INTRAMUSCULAR | Status: AC
  Administered 2018-02-19: 200 mg via INTRAVENOUS
  Filled 2018-02-19 (×2): qty 200

## 2018-02-19 MED ORDER — ADULT MULTIVITAMIN W/MINERALS CH
1.0000 | ORAL_TABLET | Freq: Every day | ORAL | Status: DC
  Administered 2018-02-20 – 2018-02-22 (×2): 1 via ORAL
  Filled 2018-02-19 (×2): qty 1

## 2018-02-19 MED ORDER — ACETAMINOPHEN 325 MG PO TABS: 650.0000 mg | ORAL_TABLET | Freq: Four times a day (QID) | ORAL | Status: DC | PRN

## 2018-02-19 MED ORDER — INFLUENZA VAC SPLIT HIGH-DOSE 0.5 ML IM SUSY
0.5000 mL | PREFILLED_SYRINGE | INTRAMUSCULAR | Status: AC
  Administered 2018-02-20: 0.5 mL via INTRAMUSCULAR
  Filled 2018-02-19: qty 0.5

## 2018-02-19 MED ORDER — CYCLOBENZAPRINE HCL 10 MG PO TABS
5.0000 mg | ORAL_TABLET | Freq: Once | ORAL | Status: AC
  Administered 2018-02-19: 5 mg via ORAL
  Filled 2018-02-19: qty 1

## 2018-02-19 MED ORDER — LISINOPRIL 20 MG PO TABS
20.0000 mg | ORAL_TABLET | Freq: Every day | ORAL | Status: DC
  Administered 2018-02-20 – 2018-02-22 (×2): 20 mg via ORAL
  Filled 2018-02-19 (×2): qty 1

## 2018-02-19 MED ORDER — PRAVASTATIN SODIUM 20 MG PO TABS
40.0000 mg | ORAL_TABLET | Freq: Every day | ORAL | Status: DC
  Administered 2018-02-19 – 2018-02-21 (×3): 40 mg via ORAL
  Filled 2018-02-19 (×2): qty 1
  Filled 2018-02-19: qty 2

## 2018-02-19 MED ORDER — POLYETHYLENE GLYCOL 3350 17 G PO PACK: 17.0000 g | PACK | Freq: Every day | ORAL | Status: DC | PRN

## 2018-02-19 MED ORDER — HYDRALAZINE HCL 20 MG/ML IJ SOLN: 10.0000 mg | INTRAMUSCULAR | Status: DC | PRN

## 2018-02-19 MED ORDER — ACETAMINOPHEN 650 MG RE SUPP: 650.0000 mg | Freq: Four times a day (QID) | RECTAL | Status: DC | PRN

## 2018-02-19 MED ORDER — HYDRALAZINE HCL 20 MG/ML IJ SOLN
10.0000 mg | Freq: Once | INTRAMUSCULAR | Status: AC
  Administered 2018-02-19: 10 mg via INTRAVENOUS
  Filled 2018-02-19: qty 1

## 2018-02-19 MED ORDER — LEVOTHYROXINE SODIUM 100 MCG PO TABS
100.0000 ug | ORAL_TABLET | Freq: Every day | ORAL | Status: DC
  Administered 2018-02-20 – 2018-02-22 (×3): 100 ug via ORAL
  Filled 2018-02-19 (×3): qty 1

## 2018-02-19 MED ORDER — PANTOPRAZOLE SODIUM 40 MG PO TBEC
40.0000 mg | DELAYED_RELEASE_TABLET | Freq: Every day | ORAL | Status: DC
  Administered 2018-02-20 – 2018-02-22 (×3): 40 mg via ORAL
  Filled 2018-02-19 (×3): qty 1

## 2018-02-19 MED ORDER — DIPHENHYDRAMINE HCL 25 MG PO CAPS: 50.0000 mg | ORAL_CAPSULE | Freq: Once | ORAL | Status: AC

## 2018-02-19 MED ORDER — KETOROLAC TROMETHAMINE 60 MG/2ML IM SOLN
15.0000 mg | Freq: Once | INTRAMUSCULAR | Status: AC
  Administered 2018-02-19: 15 mg via INTRAMUSCULAR
  Filled 2018-02-19: qty 2

## 2018-02-19 MED ORDER — DIPHENHYDRAMINE HCL 50 MG/ML IJ SOLN
INTRAMUSCULAR | Status: AC
  Filled 2018-02-19: qty 1

## 2018-02-19 NOTE — ED Notes (Signed)
Patient reports moving/packing X3 weeks ago and felt a "twinge" in her R lower back. She has felt the pain since then but the past three days have been unbearable. Feels pain radiate down Right leg

## 2018-02-19 NOTE — ED Triage Notes (Signed)
Lower back pian radiating to R leg x 23 weeks, increasing. Originally started while moving some boxes.

## 2018-02-19 NOTE — Consult Note (Signed)
@LOGO @   MRN : [id number]  Eileen Mack is a 82 y.o. ([birth year]) female who presents with chief complaint of  Chief Complaint  Patient presents with  . Back Pain  .  History of Present Illness:  The patient presents to [facility] with the abrupt onset of low back and leg pain.  The pain was 10/10, she described it as terrible extremely intense.  CT scan was ordered and the aneurysm was found incidentally by CT scan. Patient reports abdominal pain and unusual back pain, but no other abdominal complaints.  No history of an acute onset of painful blue discoloration of the toes.  Of note she was in hypertensive crisis on arrival to the ER but now is normotensive and her pain has subsided.       No family history of AAA.   Patient denies amaurosis fugax or TIA symptoms. There is no history of claudication or rest pain symptoms of the lower extremities.  The patient denies angina or shortness of breath.  I have personally reviewed the CT scan shows an AAA that measures 4.8 cm AAA  No outpatient medications have been marked as taking for the 02/19/18 encounter [facility] Encounter).    Past Medical History:  Diagnosis Date  . Anemia   . Atrial fibrillation (HCC)   . COPD (chronic obstructive pulmonary disease) (HCC)   . GERD (gastroesophageal reflux disease)   . History of blood clots   . Hypertension   . Personal history of tobacco use, presenting hazards to health 07/01/2015  . Renal disorder     Past Surgical History:  Procedure Laterality Date  . ABDOMINAL HYSTERECTOMY    . CARDIAC SURGERY    . CHOLECYSTECTOMY    . COLONOSCOPY WITH PROPOFOL Bilateral 08/01/2015   Procedure: COLONOSCOPY WITH PROPOFOL;  Surgeon: Scot Jun, MD;  Location: [facility] ENDOSCOPY;  Service: Endoscopy;  Laterality: Bilateral;  . ESOPHAGOGASTRODUODENOSCOPY N/A 07/31/2015   Procedure: ESOPHAGOGASTRODUODENOSCOPY (EGD);  Surgeon: Scot Jun, MD;  Location: [facility] ENDOSCOPY;  Service: Endoscopy;   Laterality: N/A;    Social History Social History   Tobacco Use  . Smoking status: Current Every Day Smoker    Packs/day: 1.00    Years: 60.00    Pack years: 60.00  . Smokeless tobacco: Never Used  Substance Use Topics  . Alcohol use: Not on file  . Drug use: Not on file    Family History History reviewed. No pertinent family history. No family history of bleeding/clotting disorders, porphyria or autoimmune disease  Allergies  Allergen Reactions  . Iodinated Diagnostic Agents Hives, Itching and Rash    She states she has to be premedicated. SPM  . Sulfa Antibiotics Rash     REVIEW OF SYSTEMS (Negative unless checked)  Constitutional: [] Weight loss  [] Fever  [] Chills Cardiac: [] Chest pain   [] Chest pressure   [] Palpitations   [] Shortness of breath when laying flat   [] Shortness of breath with exertion. Vascular:  [] Pain in legs with walking   [x] Pain in legs at rest  [] History of DVT   [] Phlebitis   [] Swelling in legs   [] Varicose veins   [] Non-healing ulcers Pulmonary:   [] Uses home oxygen   [] Productive cough   [] Hemoptysis   [] Wheeze  [] COPD   [] Asthma Neurologic:  [] Dizziness   [] Seizures   [] History of stroke   [] History of TIA  [] Aphasia   [] Vissual changes   [] Weakness or numbness in arm   [] Weakness or numbness in leg Musculoskeletal:   [] Joint swelling   []   Joint pain   [x] Low back pain Hematologic:  [] Easy bruising  [] Easy bleeding   [] Hypercoagulable state   [] Anemic Gastrointestinal:  [] Diarrhea   [] Vomiting  [] Gastroesophageal reflux/heartburn   [] Difficulty swallowing. Genitourinary:  [] Chronic kidney disease   [] Difficult urination  [] Frequent urination   [] Blood in urine Skin:  [] Rashes   [] Ulcers  Psychological:  [] History of anxiety   []  History of major depression.  Physical Examination  Vitals:   02/19/18 1945 02/19/18 2000 02/19/18 2030 02/19/18 2109  BP: (!) 115/51 (!) 117/50 (!) 117/46 (!) 122/49  Pulse: (!) 54 (!) 56 (!) 58 (!) 59  Resp: 10 14 12     Temp:    97.7 F (36.5 C)  TempSrc:    Oral  SpO2: 99% 95% 93% 100%  Weight:      Height:       Body mass index is 26.47 kg/m. Gen: WD/WN, NAD Head: [place]/AT, No temporalis wasting.  Ear/Nose/Throat: Hearing grossly intact, nares w/o erythema or drainage Eyes: PER, EOMI, sclera nonicteric.  Neck: Supple, no large masses.   Pulmonary:  Good air movement, no audible wheezing bilaterally, no use of accessory muscles.  Cardiac: RRR, no JVD Vascular: AAA is palpable and tender to touch Vessel Right Left  Radial Palpable Palpable  Popliteal Palpable Palpable  PT Palpable Palpable  DP Palpable Palpable  Gastrointestinal: Non-distended. No guarding/no peritoneal signs.  Musculoskeletal: M/S 5/5 throughout.  No deformity or atrophy.  Neurologic: CN 2-12 intact. Symmetrical.  Speech is fluent. Motor exam as listed above. Psychiatric: Judgment intact, Mood & affect appropriate for pt's clinical situation. Dermatologic: No rashes or ulcers noted.  No changes consistent with cellulitis. Lymph : No lichenification or skin changes of chronic lymphedema.  CBC Lab Results  Component Value Date   WBC 5.6 02/19/2018   HGB 12.5 02/19/2018   HCT 38.7 02/19/2018   MCV 95.6 02/19/2018   PLT 311 02/19/2018    BMET    Component Value Date/Time   NA 140 02/19/2018 1653   K 3.6 02/19/2018 1653   CL 106 02/19/2018 1653   CO2 27 02/19/2018 1653   GLUCOSE 96 02/19/2018 1653   BUN 19 02/19/2018 1653   CREATININE 0.94 02/19/2018 1653   CALCIUM 9.5 02/19/2018 1653   GFRNONAA 56 (L) 02/19/2018 1653   GFRAA >60 02/19/2018 1653   Estimated Creatinine Clearance: 50 mL/min (by C-G formula based on SCr of 0.94 mg/dL).  COAG Lab Results  Component Value Date   INR 1.45 07/31/2015   INR 2.87 07/29/2015    Radiology Ct Abdomen Pelvis Wo Contrast  Result Date: 02/19/2018 CLINICAL DATA:  Abdominal aortic aneurysm. Back pain and hypertension. Ultrasound demonstrating possible increased size.  EXAM: CT ANGIOGRAPHY CHEST, ABDOMEN AND PELVIS TECHNIQUE: Multidetector CT imaging through the chest, abdomen and pelvis was performed using the standard protocol during bolus administration of intravenous contrast. Precontrast images were also performed. Multiplanar reconstructed images and MIPs were obtained and reviewed to evaluate the vascular anatomy. CONTRAST:  75mL ISOVUE-370 IOPAMIDOL (ISOVUE-370) INJECTION 76% COMPARISON:  Ultrasound of earlier in the day. Most recent CT abdomen pelvis 07/29/2015. Chest CT 07/03/2015 reviewed. FINDINGS: CTA CHEST FINDINGS Cardiovascular: No intramural hematoma. Advanced aortic and branch vessel atherosclerosis. Extensive ulcerative plaque in the descending aorta. No aortic aneurysm or dissection. Normal heart size, without pericardial effusion. Multivessel coronary artery atherosclerosis. No central pulmonary embolism, on this non-dedicated study. Mediastinum/Nodes: No mediastinal or hilar adenopathy. Lungs/Pleura: No pleural fluid. Mild centrilobular emphysema. Right lower lobe 7 mm ground-glass nodule  is similar to minimally enlarged from 6 mm on the prior exam. Example image 93/14 today. A superior segment left lower lobe well-circumscribed pulmonary nodule measures 7 mm on image 49/14 and is similar to 07/03/2015, presumably benign. Other soft tissue density pulmonary nodules are on the order of 5-6 mm and less are identified on series 14. These are felt to be similar. Musculoskeletal: No acute osseous abnormality. Review of the MIP images confirms the above findings. CTA ABDOMEN AND PELVIS FINDINGS VASCULAR Aorta: Advanced abdominal aortic and branch vessel atherosclerosis. The infrarenal aorta measures maximally 4.8 x 4.6 cm on image 128/12. Compare 4.0 x 4.2 cm on 07/29/2015. Extensive wall thrombus within. No periaortic hemorrhage or evidence of dissection. Celiac: Widely patent. SMA: Atherosclerotic without significant stenosis. Renals: Suspect mild left renal  artery narrowing secondary to atherosclerosis. Single renal arteries bilaterally. IMA: Not confidently visualized proximally. Suspect chronically occluded. Inflow: No iliac artery aneurysm or significant stenosis. Veins: Patent where opacified. Review of the MIP images confirms the above findings. NON-VASCULAR Hepatobiliary: Normal liver. Cholecystectomy, without biliary ductal dilatation. Pancreas: Normal, without mass or ductal dilatation. Spleen: Hypoattenuating lesion within the central spleen is of doubtful clinical significance. Adrenals/Urinary Tract: Normal adrenal glands. No renal calculi or hydronephrosis. No bladder stone. No renal mass on post-contrast imaging. No enhancing bladder mass. Stomach/Bowel: The stomach is underdistended. Greater curvature appears thick walled proximally including at 3.2 cm. Periampullary duodenal diverticulum. Scattered colonic diverticula. Otherwise normal small bowel. Lymphatic: No abdominopelvic adenopathy. Reproductive: Hysterectomy.  No adnexal mass. Other: No significant free fluid. Mild pelvic floor laxity. No free intraperitoneal air. Musculoskeletal: Lumbosacral spondylosis. Review of the MIP images confirms the above findings. IMPRESSION: 1. Since 07/29/2015, increase in infrarenal abdominal aortic aneurysm, without surrounding hemorrhage or other acute complication. 2. Bilateral pulmonary nodules are primarily similar to 2017. There is a ground-glass nodule which may have enlarged minimally since the prior. Initial follow-up with CT at 6-12 months is recommended to confirm persistence. If persistent, repeat CT is recommended every 2 years until 5 years of stability has been established. This recommendation follows the consensus statement: Guidelines for Management of Incidental Pulmonary Nodules Detected on CT Images: From the Fleischner Society 2017; Radiology 2017; 284:228-243. 3. Aortic atherosclerosis (ICD10-I70.0), coronary artery atherosclerosis and emphysema  (ICD10-J43.9). 4. Apparent greater curvature gastric wall thickening is at least partially felt to be due to underdistention. Correlate with any symptoms of gastritis. Electronically Signed   By: Jeronimo Greaves M.D.   On: 02/19/2018 18:35   Dg Lumbar Spine 2-3 Views  Result Date: 02/19/2018 CLINICAL DATA:  Mid-low back pain EXAM: LUMBAR SPINE - 2-3 VIEW COMPARISON:  CT 07/29/2015 FINDINGS: Surgical clips in the right upper quadrant. Mild scoliosis of the spine. Vertebral body heights are maintained. Moderate degenerative changes at L3-L4 and L4-L5 with mild degenerative change at L5-S1. Calcified distal aortic aneurysm measuring 5.8 cm AP. Additional calcifications in the left upper quadrant, likely vascular. IMPRESSION: 1. Mild scoliosis of the spine with multiple level degenerative change. No acute osseous abnormality. 2. 5.8 cm abdominal aortic aneurysm, appears increased since CT from 07/29/2015 at which time aneurysm measured approximately 4.2 cm. CTA follow-up is recommended. Electronically Signed   By: Jasmine Pang M.D.   On: 02/19/2018 14:41   [place] Aaa Duplex Limited  Result Date: 02/19/2018 CLINICAL DATA:  Known abdominal aortic aneurysm with possible enlargement by lumbar spine radiograph today. EXAM: ULTRASOUND OF ABDOMINAL AORTA TECHNIQUE: Ultrasound examination of the abdominal aorta was performed to evaluate for abdominal aortic aneurysm.  COMPARISON:  Lumbar spine x-rays earlier today as well as ultrasound of the aorta on 08/25/2017 and CT of the abdomen and pelvis without contrast on 07/29/2015. FINDINGS: Abdominal aortic measurements as follows: Proximal:  2.7 x 2.0 cm cm Mid:  2.6 x 1.9 cm cm Distal:  5.1 x 5.5 cm cm Visualized proximal common iliac artery origins appear normal in caliber. Fusiform aneurysmal disease of the distal abdominal aorta does appear to be slightly larger by ultrasound over the last 6 months. The most accurate representation of aneurysmal disease would be by CTA of  the abdomen and pelvis with contrast. IMPRESSION: Probable enlargement of abdominal aortic aneurysm since prior ultrasound 6 months ago with maximal diameter now estimated to be just over 5 cm by ultrasound. Given maximal diameter of greater than 5 cm as well as evidence of interval growth, CTA of the abdomen and pelvis with contrast is indicated as well as referral to vascular surgery. Electronically Signed   By: Irish Lack M.D.   On: 02/19/2018 16:18   Ct Angio Chest Aorta W/cm &/or Wo/cm  Result Date: 02/19/2018 CLINICAL DATA:  Abdominal aortic aneurysm. Back pain and hypertension. Ultrasound demonstrating possible increased size. EXAM: CT ANGIOGRAPHY CHEST, ABDOMEN AND PELVIS TECHNIQUE: Multidetector CT imaging through the chest, abdomen and pelvis was performed using the standard protocol during bolus administration of intravenous contrast. Precontrast images were also performed. Multiplanar reconstructed images and MIPs were obtained and reviewed to evaluate the vascular anatomy. CONTRAST:  75mL ISOVUE-370 IOPAMIDOL (ISOVUE-370) INJECTION 76% COMPARISON:  Ultrasound of earlier in the day. Most recent CT abdomen pelvis 07/29/2015. Chest CT 07/03/2015 reviewed. FINDINGS: CTA CHEST FINDINGS Cardiovascular: No intramural hematoma. Advanced aortic and branch vessel atherosclerosis. Extensive ulcerative plaque in the descending aorta. No aortic aneurysm or dissection. Normal heart size, without pericardial effusion. Multivessel coronary artery atherosclerosis. No central pulmonary embolism, on this non-dedicated study. Mediastinum/Nodes: No mediastinal or hilar adenopathy. Lungs/Pleura: No pleural fluid. Mild centrilobular emphysema. Right lower lobe 7 mm ground-glass nodule is similar to minimally enlarged from 6 mm on the prior exam. Example image 93/14 today. A superior segment left lower lobe well-circumscribed pulmonary nodule measures 7 mm on image 49/14 and is similar to 07/03/2015, presumably  benign. Other soft tissue density pulmonary nodules are on the order of 5-6 mm and less are identified on series 14. These are felt to be similar. Musculoskeletal: No acute osseous abnormality. Review of the MIP images confirms the above findings. CTA ABDOMEN AND PELVIS FINDINGS VASCULAR Aorta: Advanced abdominal aortic and branch vessel atherosclerosis. The infrarenal aorta measures maximally 4.8 x 4.6 cm on image 128/12. Compare 4.0 x 4.2 cm on 07/29/2015. Extensive wall thrombus within. No periaortic hemorrhage or evidence of dissection. Celiac: Widely patent. SMA: Atherosclerotic without significant stenosis. Renals: Suspect mild left renal artery narrowing secondary to atherosclerosis. Single renal arteries bilaterally. IMA: Not confidently visualized proximally. Suspect chronically occluded. Inflow: No iliac artery aneurysm or significant stenosis. Veins: Patent where opacified. Review of the MIP images confirms the above findings. NON-VASCULAR Hepatobiliary: Normal liver. Cholecystectomy, without biliary ductal dilatation. Pancreas: Normal, without mass or ductal dilatation. Spleen: Hypoattenuating lesion within the central spleen is of doubtful clinical significance. Adrenals/Urinary Tract: Normal adrenal glands. No renal calculi or hydronephrosis. No bladder stone. No renal mass on post-contrast imaging. No enhancing bladder mass. Stomach/Bowel: The stomach is underdistended. Greater curvature appears thick walled proximally including at 3.2 cm. Periampullary duodenal diverticulum. Scattered colonic diverticula. Otherwise normal small bowel. Lymphatic: No abdominopelvic adenopathy. Reproductive: Hysterectomy.  No  adnexal mass. Other: No significant free fluid. Mild pelvic floor laxity. No free intraperitoneal air. Musculoskeletal: Lumbosacral spondylosis. Review of the MIP images confirms the above findings. IMPRESSION: 1. Since 07/29/2015, increase in infrarenal abdominal aortic aneurysm, without  surrounding hemorrhage or other acute complication. 2. Bilateral pulmonary nodules are primarily similar to 2017. There is a ground-glass nodule which may have enlarged minimally since the prior. Initial follow-up with CT at 6-12 months is recommended to confirm persistence. If persistent, repeat CT is recommended every 2 years until 5 years of stability has been established. This recommendation follows the consensus statement: Guidelines for Management of Incidental Pulmonary Nodules Detected on CT Images: From the Fleischner Society 2017; Radiology 2017; 284:228-243. 3. Aortic atherosclerosis (ICD10-I70.0), coronary artery atherosclerosis and emphysema (ICD10-J43.9). 4. Apparent greater curvature gastric wall thickening is at least partially felt to be due to underdistention. Correlate with any symptoms of gastritis. Electronically Signed   By: Jeronimo Greaves M.D.   On: 02/19/2018 18:35   Ct Angio Abd/pel W And/or Wo Contrast  Result Date: 02/19/2018 CLINICAL DATA:  Abdominal aortic aneurysm. Back pain and hypertension. Ultrasound demonstrating possible increased size. EXAM: CT ANGIOGRAPHY CHEST, ABDOMEN AND PELVIS TECHNIQUE: Multidetector CT imaging through the chest, abdomen and pelvis was performed using the standard protocol during bolus administration of intravenous contrast. Precontrast images were also performed. Multiplanar reconstructed images and MIPs were obtained and reviewed to evaluate the vascular anatomy. CONTRAST:  75mL ISOVUE-370 IOPAMIDOL (ISOVUE-370) INJECTION 76% COMPARISON:  Ultrasound of earlier in the day. Most recent CT abdomen pelvis 07/29/2015. Chest CT 07/03/2015 reviewed. FINDINGS: CTA CHEST FINDINGS Cardiovascular: No intramural hematoma. Advanced aortic and branch vessel atherosclerosis. Extensive ulcerative plaque in the descending aorta. No aortic aneurysm or dissection. Normal heart size, without pericardial effusion. Multivessel coronary artery atherosclerosis. No central  pulmonary embolism, on this non-dedicated study. Mediastinum/Nodes: No mediastinal or hilar adenopathy. Lungs/Pleura: No pleural fluid. Mild centrilobular emphysema. Right lower lobe 7 mm ground-glass nodule is similar to minimally enlarged from 6 mm on the prior exam. Example image 93/14 today. A superior segment left lower lobe well-circumscribed pulmonary nodule measures 7 mm on image 49/14 and is similar to 07/03/2015, presumably benign. Other soft tissue density pulmonary nodules are on the order of 5-6 mm and less are identified on series 14. These are felt to be similar. Musculoskeletal: No acute osseous abnormality. Review of the MIP images confirms the above findings. CTA ABDOMEN AND PELVIS FINDINGS VASCULAR Aorta: Advanced abdominal aortic and branch vessel atherosclerosis. The infrarenal aorta measures maximally 4.8 x 4.6 cm on image 128/12. Compare 4.0 x 4.2 cm on 07/29/2015. Extensive wall thrombus within. No periaortic hemorrhage or evidence of dissection. Celiac: Widely patent. SMA: Atherosclerotic without significant stenosis. Renals: Suspect mild left renal artery narrowing secondary to atherosclerosis. Single renal arteries bilaterally. IMA: Not confidently visualized proximally. Suspect chronically occluded. Inflow: No iliac artery aneurysm or significant stenosis. Veins: Patent where opacified. Review of the MIP images confirms the above findings. NON-VASCULAR Hepatobiliary: Normal liver. Cholecystectomy, without biliary ductal dilatation. Pancreas: Normal, without mass or ductal dilatation. Spleen: Hypoattenuating lesion within the central spleen is of doubtful clinical significance. Adrenals/Urinary Tract: Normal adrenal glands. No renal calculi or hydronephrosis. No bladder stone. No renal mass on post-contrast imaging. No enhancing bladder mass. Stomach/Bowel: The stomach is underdistended. Greater curvature appears thick walled proximally including at 3.2 cm. Periampullary duodenal  diverticulum. Scattered colonic diverticula. Otherwise normal small bowel. Lymphatic: No abdominopelvic adenopathy. Reproductive: Hysterectomy.  No adnexal mass. Other: No significant free fluid.  Mild pelvic floor laxity. No free intraperitoneal air. Musculoskeletal: Lumbosacral spondylosis. Review of the MIP images confirms the above findings. IMPRESSION: 1. Since 07/29/2015, increase in infrarenal abdominal aortic aneurysm, without surrounding hemorrhage or other acute complication. 2. Bilateral pulmonary nodules are primarily similar to 2017. There is a ground-glass nodule which may have enlarged minimally since the prior. Initial follow-up with CT at 6-12 months is recommended to confirm persistence. If persistent, repeat CT is recommended every 2 years until 5 years of stability has been established. This recommendation follows the consensus statement: Guidelines for Management of Incidental Pulmonary Nodules Detected on CT Images: From the Fleischner Society 2017; Radiology 2017; 284:228-243. 3. Aortic atherosclerosis (ICD10-I70.0), coronary artery atherosclerosis and emphysema (ICD10-J43.9). 4. Apparent greater curvature gastric wall thickening is at least partially felt to be due to underdistention. Correlate with any symptoms of gastritis. Electronically Signed   By: Jeronimo Greaves M.D.   On: 02/19/2018 18:35      Assessment/Plan  1.  Symptomatic AAA: Recommend: The aneurysm is > 5 cm and therefore should undergo repair. Patient is status post CT scan of the abdominal aorta. The patient is a candidate for endovascular repair.   He will require cardiac clearance. She sees Dr Lady Gary and I will ask [facility] cardiology to see her tomorrow.  My plan would be for repair on Wednesday  The patient will continue antiplatelet therapy as prescribed (since the patient is undergoing endovascular repair as opposed to open repair) as well as aggressive management of hyperlipidemia. Exercise is again strongly encouraged.    The patient is reminded that lifetime routine surveillance is a necessity with an endograft.   The risks and benefits of AAA repair are reviewed with the patient.  All questions are answered.  Alternative therapies are also discussed.  The patient agrees to proceed with endovascular aneurysm repair.  Patient will follow-up with me in the office after the surgery.  2. Hypertension: Continue antihypertensive medications as already ordered, these medications have been reviewed and there are no changes at this time.  3. COPD: Continue pulmonary medications and aerosols as already ordered, these medications have been reviewed and there are no changes at this time.  4. DJD Lumbar spine: Continue pain medications as already ordered, these medications have been reviewed and there are no changes at this time.  If I am able I would like to get an LS spine MRI before the sent is placed.  Continued activity and therapy was stressed.    Levora Dredge, MD  02/19/2018 9:36 PM

## 2018-02-19 NOTE — H&P (Signed)
[facility] - [place] at [facility]   PATIENT NAME: Eileen Mack    MR#:  [id number]  DATE OF BIRTH:  Apr 17, 1936  DATE OF ADMISSION:  02/19/2018  PRIMARY CARE PHYSICIAN: [place], [facility]   REQUESTING/REFERRING PHYSICIAN:  Dr. Roxan Hockey  CHIEF COMPLAINT:  lower back pain on and off for several weeks more so for last few days  HISTORY OF PRESENT ILLNESS:  Eileen Mack  is a 82 y.o. female with a known history of hypertension, Gerd, atrial fibrillation, AAA without rupture followed at vascular surgery comes to the emergency room with complaints of back pain for 2 to 3 weeks. Patient recently moved into her daughter's home and was lifting/twisting movement while unpacking the boxes. She started having some radicular component of her pain to the right lower extremity. Denies any urinary or bowel incontinence. Rates pain as 10 out of 10.  Part of the workup it was noted patient's AAA has enlarged since last ultrasound done in June 2019. Patient's blood pressure reading went up to 170 systolic and IV Nicardipine drip was started. Dr. Gilda Crease evaluated the patient in the ER. CT injury did not show any evidence of rupture.  Pt is being admitted for back pain and repair/evaluation of her AAA  Discussed with Dr. Roxan Hockey likely will wean off for Nicardipine drip since blood pressure has been steadily improved  PAST MEDICAL HISTORY:   Past Medical History:  Diagnosis Date  . Anemia   . Atrial fibrillation (HCC)   . COPD (chronic obstructive pulmonary disease) (HCC)   . GERD (gastroesophageal reflux disease)   . History of blood clots   . Hypertension   . Personal history of tobacco use, presenting hazards to health 07/01/2015  . Renal disorder     PAST SURGICAL HISTOIRY:   Past Surgical History:  Procedure Laterality Date  . ABDOMINAL HYSTERECTOMY    . CARDIAC SURGERY    . CHOLECYSTECTOMY    . COLONOSCOPY WITH PROPOFOL Bilateral 08/01/2015    Procedure: COLONOSCOPY WITH PROPOFOL;  Surgeon: Scot Jun, MD;  Location: [facility] ENDOSCOPY;  Service: Endoscopy;  Laterality: Bilateral;  . ESOPHAGOGASTRODUODENOSCOPY N/A 07/31/2015   Procedure: ESOPHAGOGASTRODUODENOSCOPY (EGD);  Surgeon: Scot Jun, MD;  Location: [facility] ENDOSCOPY;  Service: Endoscopy;  Laterality: N/A;    SOCIAL HISTORY:   Social History   Tobacco Use  . Smoking status: Current Every Day Smoker    Packs/day: 1.00    Years: 60.00    Pack years: 60.00  . Smokeless tobacco: Never Used  Substance Use Topics  . Alcohol use: Not on file    FAMILY HISTORY:  No family history on file.  DRUG ALLERGIES:   Allergies  Allergen Reactions  . Iodinated Diagnostic Agents Hives, Itching and Rash    She states she has to be premedicated. SPM  . Sulfa Antibiotics Rash    REVIEW OF SYSTEMS:  Review of Systems  Constitutional: Negative for chills, fever and weight loss.  HENT: Negative for ear discharge, ear pain and nosebleeds.   Eyes: Negative for blurred vision, pain and discharge.  Respiratory: Negative for sputum production, shortness of breath, wheezing and stridor.   Cardiovascular: Negative for chest pain, palpitations, orthopnea and PND.  Gastrointestinal: Negative for abdominal pain, diarrhea, nausea and vomiting.  Genitourinary: Negative for frequency and urgency.  Musculoskeletal: Positive for back pain and joint pain.  Neurological: Positive for weakness. Negative for sensory change, speech change and focal weakness.  Psychiatric/Behavioral: Negative for depression and hallucinations. The  patient is not nervous/anxious.      MEDICATIONS AT HOME:   Prior to Admission medications   Medication Sig Start Date End Date Taking? Authorizing Provider  aspirin EC 81 MG tablet Take 81 mg by mouth daily.    [provider]  carvedilol (COREG) 12.5 MG tablet Take 12.5 mg by mouth 2 (two) times daily with a meal.    [provider]   cholestyramine light (PREVALITE) 4 g packet Take 4 g by mouth daily.    [provider]  clonazePAM (KLONOPIN) 0.5 MG tablet Take 0.5 mg by mouth at bedtime.    [provider]  cloNIDine (CATAPRES) 0.2 MG tablet Take 0.2 mg by mouth 2 (two) times daily.    [provider]  diphenhydramine-acetaminophen (TYLENOL PM) 25-500 MG TABS tablet Take 1 tablet by mouth at bedtime.    [provider]  ferrous sulfate 325 (65 FE) MG tablet Take 1 tablet (325 mg total) by mouth daily with breakfast. 08/01/15   Adrian Saran, MD  gabapentin (NEURONTIN) 300 MG capsule Take 600 mg by mouth 2 (two) times daily.    [provider]  gemfibrozil (LOPID) 600 MG tablet Take 600 mg by mouth 2 (two) times daily before a meal.    [provider]  levothyroxine (SYNTHROID, LEVOTHROID) 100 MCG tablet Take 100 mcg by mouth daily before breakfast.    [provider]  lisinopril (PRINIVIL,ZESTRIL) 20 MG tablet Take 20 mg by mouth daily.    [provider]  Multiple Vitamins-Minerals (MULTIVITAMIN) tablet Take 1 tablet by mouth daily. 08/01/15   Adrian Saran, MD  nitroGLYCERIN (NITROSTAT) 0.4 MG SL tablet Place 0.4 mg under the tongue every 5 (five) minutes as needed for chest pain.    [provider]  omeprazole (PRILOSEC) 20 MG capsule Take by mouth. 05/16/17 03/22/18  [provider]  pantoprazole (PROTONIX) 40 MG tablet Take 1 tablet (40 mg total) by mouth daily. 08/01/15   Adrian Saran, MD  pravastatin (PRAVACHOL) 40 MG tablet Take 40 mg by mouth at bedtime.    [provider]  ranitidine (ZANTAC) 150 MG capsule  08/21/17   [provider]  venlafaxine XR (EFFEXOR-XR) 75 MG 24 hr capsule Take 75-150 mg by mouth 2 (two) times daily. Pt. Takes 2 tablets every morning and 1 tablet every evening.    [provider]      VITAL SIGNS:  Blood pressure (!) 122/55, pulse 60, temperature 98.3 F (36.8 C), temperature source  Oral, resp. rate (!) 23, height 5\' 6"  (1.676 m), weight 74.4 kg, SpO2 98 %.  PHYSICAL EXAMINATION:  GENERAL:  82 y.o.-year-old patient lying in the bed with mild to moderate acute distress due to back pain EYES: Pupils equal, round, reactive to light and accommodation. No scleral icterus. Extraocular muscles intact.  HEENT: Head atraumatic, normocephalic. Oropharynx and nasopharynx clear.  NECK:  Supple, no jugular venous distention. No thyroid enlargement, no tenderness.  LUNGS: Normal breath sounds bilaterally, no wheezing, rales,rhonchi or crepitation. No use of accessory muscles of respiration.  CARDIOVASCULAR: S1, S2 normal. No murmurs, rubs, or gallops.  ABDOMEN: Soft, nontender, nondistended. Bowel sounds present. No organomegaly or mass.  EXTREMITIES: No pedal edema, cyanosis, or clubbing.  NEUROLOGIC: Cranial nerves II through XII are intact. Muscle strength 5/5 in all extremities. Sensation intact. Gait not checked.  PSYCHIATRIC: The patient is alert and oriented x 3.  SKIN: No obvious rash, lesion, or ulcer.   LABORATORY PANEL:   CBC  Recent Labs  Lab 02/19/18 1653  WBC 5.6  HGB 12.5  HCT 38.7  PLT 311   ------------------------------------------------------------------------------------------------------------------  Chemistries  Recent Labs  Lab 02/19/18 1653  NA 140  K 3.6  CL 106  CO2 27  GLUCOSE 96  BUN 19  CREATININE 0.94  CALCIUM 9.5  AST 17  ALT 11  ALKPHOS 87  BILITOT 0.5   ------------------------------------------------------------------------------------------------------------------  Cardiac Enzymes No results for input(s): TROPONINI in the last 168 hours. ------------------------------------------------------------------------------------------------------------------  RADIOLOGY:  Ct Abdomen Pelvis Wo Contrast  Result Date: 02/19/2018 CLINICAL DATA:  Abdominal aortic aneurysm. Back pain and hypertension. Ultrasound demonstrating possible  increased size. EXAM: CT ANGIOGRAPHY CHEST, ABDOMEN AND PELVIS TECHNIQUE: Multidetector CT imaging through the chest, abdomen and pelvis was performed using the standard protocol during bolus administration of intravenous contrast. Precontrast images were also performed. Multiplanar reconstructed images and MIPs were obtained and reviewed to evaluate the vascular anatomy. CONTRAST:  75mL ISOVUE-370 IOPAMIDOL (ISOVUE-370) INJECTION 76% COMPARISON:  Ultrasound of earlier in the day. Most recent CT abdomen pelvis 07/29/2015. Chest CT 07/03/2015 reviewed. FINDINGS: CTA CHEST FINDINGS Cardiovascular: No intramural hematoma. Advanced aortic and branch vessel atherosclerosis. Extensive ulcerative plaque in the descending aorta. No aortic aneurysm or dissection. Normal heart size, without pericardial effusion. Multivessel coronary artery atherosclerosis. No central pulmonary embolism, on this non-dedicated study. Mediastinum/Nodes: No mediastinal or hilar adenopathy. Lungs/Pleura: No pleural fluid. Mild centrilobular emphysema. Right lower lobe 7 mm ground-glass nodule is similar to minimally enlarged from 6 mm on the prior exam. Example image 93/14 today. A superior segment left lower lobe well-circumscribed pulmonary nodule measures 7 mm on image 49/14 and is similar to 07/03/2015, presumably benign. Other soft tissue density pulmonary nodules are on the order of 5-6 mm and less are identified on series 14. These are felt to be similar. Musculoskeletal: No acute osseous abnormality. Review of the MIP images confirms the above findings. CTA ABDOMEN AND PELVIS FINDINGS VASCULAR Aorta: Advanced abdominal aortic and branch vessel atherosclerosis. The infrarenal aorta measures maximally 4.8 x 4.6 cm on image 128/12. Compare 4.0 x 4.2 cm on 07/29/2015. Extensive wall thrombus within. No periaortic hemorrhage or evidence of dissection. Celiac: Widely patent. SMA: Atherosclerotic without significant stenosis. Renals: Suspect mild  left renal artery narrowing secondary to atherosclerosis. Single renal arteries bilaterally. IMA: Not confidently visualized proximally. Suspect chronically occluded. Inflow: No iliac artery aneurysm or significant stenosis. Veins: Patent where opacified. Review of the MIP images confirms the above findings. NON-VASCULAR Hepatobiliary: Normal liver. Cholecystectomy, without biliary ductal dilatation. Pancreas: Normal, without mass or ductal dilatation. Spleen: Hypoattenuating lesion within the central spleen is of doubtful clinical significance. Adrenals/Urinary Tract: Normal adrenal glands. No renal calculi or hydronephrosis. No bladder stone. No renal mass on post-contrast imaging. No enhancing bladder mass. Stomach/Bowel: The stomach is underdistended. Greater curvature appears thick walled proximally including at 3.2 cm. Periampullary duodenal diverticulum. Scattered colonic diverticula. Otherwise normal small bowel. Lymphatic: No abdominopelvic adenopathy. Reproductive: Hysterectomy.  No adnexal mass. Other: No significant free fluid. Mild pelvic floor laxity. No free intraperitoneal air. Musculoskeletal: Lumbosacral spondylosis. Review of the MIP images confirms the above findings. IMPRESSION: 1. Since 07/29/2015, increase in infrarenal abdominal aortic aneurysm, without surrounding hemorrhage or other acute complication. 2. Bilateral pulmonary nodules are primarily similar to 2017. There is a ground-glass nodule which may have enlarged minimally since the prior. Initial follow-up with CT at 6-12 months is recommended to confirm persistence. If persistent, repeat CT is recommended every 2 years until 5 years of stability has  been established. This recommendation follows the consensus statement: Guidelines for Management of Incidental Pulmonary Nodules Detected on CT Images: From the Fleischner Society 2017; Radiology 2017; 284:228-243. 3. Aortic atherosclerosis (ICD10-I70.0), coronary artery atherosclerosis  and emphysema (ICD10-J43.9). 4. Apparent greater curvature gastric wall thickening is at least partially felt to be due to underdistention. Correlate with any symptoms of gastritis. Electronically Signed   By: Jeronimo Greaves M.D.   On: 02/19/2018 18:35   Dg Lumbar Spine 2-3 Views  Result Date: 02/19/2018 CLINICAL DATA:  Mid-low back pain EXAM: LUMBAR SPINE - 2-3 VIEW COMPARISON:  CT 07/29/2015 FINDINGS: Surgical clips in the right upper quadrant. Mild scoliosis of the spine. Vertebral body heights are maintained. Moderate degenerative changes at L3-L4 and L4-L5 with mild degenerative change at L5-S1. Calcified distal aortic aneurysm measuring 5.8 cm AP. Additional calcifications in the left upper quadrant, likely vascular. IMPRESSION: 1. Mild scoliosis of the spine with multiple level degenerative change. No acute osseous abnormality. 2. 5.8 cm abdominal aortic aneurysm, appears increased since CT from 07/29/2015 at which time aneurysm measured approximately 4.2 cm. CTA follow-up is recommended. Electronically Signed   By: Jasmine Pang M.D.   On: 02/19/2018 14:41   [place] Aaa Duplex Limited  Result Date: 02/19/2018 CLINICAL DATA:  Known abdominal aortic aneurysm with possible enlargement by lumbar spine radiograph today. EXAM: ULTRASOUND OF ABDOMINAL AORTA TECHNIQUE: Ultrasound examination of the abdominal aorta was performed to evaluate for abdominal aortic aneurysm. COMPARISON:  Lumbar spine x-rays earlier today as well as ultrasound of the aorta on 08/25/2017 and CT of the abdomen and pelvis without contrast on 07/29/2015. FINDINGS: Abdominal aortic measurements as follows: Proximal:  2.7 x 2.0 cm cm Mid:  2.6 x 1.9 cm cm Distal:  5.1 x 5.5 cm cm Visualized proximal common iliac artery origins appear normal in caliber. Fusiform aneurysmal disease of the distal abdominal aorta does appear to be slightly larger by ultrasound over the last 6 months. The most accurate representation of aneurysmal disease would  be by CTA of the abdomen and pelvis with contrast. IMPRESSION: Probable enlargement of abdominal aortic aneurysm since prior ultrasound 6 months ago with maximal diameter now estimated to be just over 5 cm by ultrasound. Given maximal diameter of greater than 5 cm as well as evidence of interval growth, CTA of the abdomen and pelvis with contrast is indicated as well as referral to vascular surgery. Electronically Signed   By: Irish Lack M.D.   On: 02/19/2018 16:18   Ct Angio Chest Aorta W/cm &/or Wo/cm  Result Date: 02/19/2018 CLINICAL DATA:  Abdominal aortic aneurysm. Back pain and hypertension. Ultrasound demonstrating possible increased size. EXAM: CT ANGIOGRAPHY CHEST, ABDOMEN AND PELVIS TECHNIQUE: Multidetector CT imaging through the chest, abdomen and pelvis was performed using the standard protocol during bolus administration of intravenous contrast. Precontrast images were also performed. Multiplanar reconstructed images and MIPs were obtained and reviewed to evaluate the vascular anatomy. CONTRAST:  75mL ISOVUE-370 IOPAMIDOL (ISOVUE-370) INJECTION 76% COMPARISON:  Ultrasound of earlier in the day. Most recent CT abdomen pelvis 07/29/2015. Chest CT 07/03/2015 reviewed. FINDINGS: CTA CHEST FINDINGS Cardiovascular: No intramural hematoma. Advanced aortic and branch vessel atherosclerosis. Extensive ulcerative plaque in the descending aorta. No aortic aneurysm or dissection. Normal heart size, without pericardial effusion. Multivessel coronary artery atherosclerosis. No central pulmonary embolism, on this non-dedicated study. Mediastinum/Nodes: No mediastinal or hilar adenopathy. Lungs/Pleura: No pleural fluid. Mild centrilobular emphysema. Right lower lobe 7 mm ground-glass nodule is similar to minimally enlarged from 6 mm on  the prior exam. Example image 93/14 today. A superior segment left lower lobe well-circumscribed pulmonary nodule measures 7 mm on image 49/14 and is similar to 07/03/2015,  presumably benign. Other soft tissue density pulmonary nodules are on the order of 5-6 mm and less are identified on series 14. These are felt to be similar. Musculoskeletal: No acute osseous abnormality. Review of the MIP images confirms the above findings. CTA ABDOMEN AND PELVIS FINDINGS VASCULAR Aorta: Advanced abdominal aortic and branch vessel atherosclerosis. The infrarenal aorta measures maximally 4.8 x 4.6 cm on image 128/12. Compare 4.0 x 4.2 cm on 07/29/2015. Extensive wall thrombus within. No periaortic hemorrhage or evidence of dissection. Celiac: Widely patent. SMA: Atherosclerotic without significant stenosis. Renals: Suspect mild left renal artery narrowing secondary to atherosclerosis. Single renal arteries bilaterally. IMA: Not confidently visualized proximally. Suspect chronically occluded. Inflow: No iliac artery aneurysm or significant stenosis. Veins: Patent where opacified. Review of the MIP images confirms the above findings. NON-VASCULAR Hepatobiliary: Normal liver. Cholecystectomy, without biliary ductal dilatation. Pancreas: Normal, without mass or ductal dilatation. Spleen: Hypoattenuating lesion within the central spleen is of doubtful clinical significance. Adrenals/Urinary Tract: Normal adrenal glands. No renal calculi or hydronephrosis. No bladder stone. No renal mass on post-contrast imaging. No enhancing bladder mass. Stomach/Bowel: The stomach is underdistended. Greater curvature appears thick walled proximally including at 3.2 cm. Periampullary duodenal diverticulum. Scattered colonic diverticula. Otherwise normal small bowel. Lymphatic: No abdominopelvic adenopathy. Reproductive: Hysterectomy.  No adnexal mass. Other: No significant free fluid. Mild pelvic floor laxity. No free intraperitoneal air. Musculoskeletal: Lumbosacral spondylosis. Review of the MIP images confirms the above findings. IMPRESSION: 1. Since 07/29/2015, increase in infrarenal abdominal aortic aneurysm,  without surrounding hemorrhage or other acute complication. 2. Bilateral pulmonary nodules are primarily similar to 2017. There is a ground-glass nodule which may have enlarged minimally since the prior. Initial follow-up with CT at 6-12 months is recommended to confirm persistence. If persistent, repeat CT is recommended every 2 years until 5 years of stability has been established. This recommendation follows the consensus statement: Guidelines for Management of Incidental Pulmonary Nodules Detected on CT Images: From the Fleischner Society 2017; Radiology 2017; 284:228-243. 3. Aortic atherosclerosis (ICD10-I70.0), coronary artery atherosclerosis and emphysema (ICD10-J43.9). 4. Apparent greater curvature gastric wall thickening is at least partially felt to be due to underdistention. Correlate with any symptoms of gastritis. Electronically Signed   By: Jeronimo Greaves M.D.   On: 02/19/2018 18:35   Ct Angio Abd/pel W And/or Wo Contrast  Result Date: 02/19/2018 CLINICAL DATA:  Abdominal aortic aneurysm. Back pain and hypertension. Ultrasound demonstrating possible increased size. EXAM: CT ANGIOGRAPHY CHEST, ABDOMEN AND PELVIS TECHNIQUE: Multidetector CT imaging through the chest, abdomen and pelvis was performed using the standard protocol during bolus administration of intravenous contrast. Precontrast images were also performed. Multiplanar reconstructed images and MIPs were obtained and reviewed to evaluate the vascular anatomy. CONTRAST:  75mL ISOVUE-370 IOPAMIDOL (ISOVUE-370) INJECTION 76% COMPARISON:  Ultrasound of earlier in the day. Most recent CT abdomen pelvis 07/29/2015. Chest CT 07/03/2015 reviewed. FINDINGS: CTA CHEST FINDINGS Cardiovascular: No intramural hematoma. Advanced aortic and branch vessel atherosclerosis. Extensive ulcerative plaque in the descending aorta. No aortic aneurysm or dissection. Normal heart size, without pericardial effusion. Multivessel coronary artery atherosclerosis. No  central pulmonary embolism, on this non-dedicated study. Mediastinum/Nodes: No mediastinal or hilar adenopathy. Lungs/Pleura: No pleural fluid. Mild centrilobular emphysema. Right lower lobe 7 mm ground-glass nodule is similar to minimally enlarged from 6 mm on the prior exam. Example image 93/14 today.  A superior segment left lower lobe well-circumscribed pulmonary nodule measures 7 mm on image 49/14 and is similar to 07/03/2015, presumably benign. Other soft tissue density pulmonary nodules are on the order of 5-6 mm and less are identified on series 14. These are felt to be similar. Musculoskeletal: No acute osseous abnormality. Review of the MIP images confirms the above findings. CTA ABDOMEN AND PELVIS FINDINGS VASCULAR Aorta: Advanced abdominal aortic and branch vessel atherosclerosis. The infrarenal aorta measures maximally 4.8 x 4.6 cm on image 128/12. Compare 4.0 x 4.2 cm on 07/29/2015. Extensive wall thrombus within. No periaortic hemorrhage or evidence of dissection. Celiac: Widely patent. SMA: Atherosclerotic without significant stenosis. Renals: Suspect mild left renal artery narrowing secondary to atherosclerosis. Single renal arteries bilaterally. IMA: Not confidently visualized proximally. Suspect chronically occluded. Inflow: No iliac artery aneurysm or significant stenosis. Veins: Patent where opacified. Review of the MIP images confirms the above findings. NON-VASCULAR Hepatobiliary: Normal liver. Cholecystectomy, without biliary ductal dilatation. Pancreas: Normal, without mass or ductal dilatation. Spleen: Hypoattenuating lesion within the central spleen is of doubtful clinical significance. Adrenals/Urinary Tract: Normal adrenal glands. No renal calculi or hydronephrosis. No bladder stone. No renal mass on post-contrast imaging. No enhancing bladder mass. Stomach/Bowel: The stomach is underdistended. Greater curvature appears thick walled proximally including at 3.2 cm. Periampullary duodenal  diverticulum. Scattered colonic diverticula. Otherwise normal small bowel. Lymphatic: No abdominopelvic adenopathy. Reproductive: Hysterectomy.  No adnexal mass. Other: No significant free fluid. Mild pelvic floor laxity. No free intraperitoneal air. Musculoskeletal: Lumbosacral spondylosis. Review of the MIP images confirms the above findings. IMPRESSION: 1. Since 07/29/2015, increase in infrarenal abdominal aortic aneurysm, without surrounding hemorrhage or other acute complication. 2. Bilateral pulmonary nodules are primarily similar to 2017. There is a ground-glass nodule which may have enlarged minimally since the prior. Initial follow-up with CT at 6-12 months is recommended to confirm persistence. If persistent, repeat CT is recommended every 2 years until 5 years of stability has been established. This recommendation follows the consensus statement: Guidelines for Management of Incidental Pulmonary Nodules Detected on CT Images: From the Fleischner Society 2017; Radiology 2017; 284:228-243. 3. Aortic atherosclerosis (ICD10-I70.0), coronary artery atherosclerosis and emphysema (ICD10-J43.9). 4. Apparent greater curvature gastric wall thickening is at least partially felt to be due to underdistention. Correlate with any symptoms of gastritis. Electronically Signed   By: Jeronimo Greaves M.D.   On: 02/19/2018 18:35    EKG:    IMPRESSION AND PLAN:   Eileen Mack  is a 82 y.o. female with a known history of hypertension, Gerd, atrial fibrillation, AAA without rupture followed at vascular surgery comes to the emergency room with complaints of back pain for 2 to 3 weeks. Patient recently moved into her daughter's home and was lifting/twisting movement while unpacking the boxes. She started having some radicular component of her pain to the right lower extremity. Denies any urinary or bowel incontinence. Rates pain as 10 out of 10.  1. 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 known history of AAA -CT Angio of abdomen and pelvis showed infrarenal aorta measures maximally 4.8 x 4.6 cm on image. Compare 4.0 x 4.2 cm on 07/29/2015. Extensive wall thrombus within. No periaortic hemorrhage or evidence of dissection. -Was evaluated by Dr. Gilda Crease vascular surgery and the emergency room. -There is plans for AAA repair on Wednesday per Dr. Gilda Crease  3. Elevated blood pressure  in the setting of AAA -highest systolic was 170. Patient was started on low-dose IV Nicardipine drip. -Discussed with ER physician Dr. Roxan Hockey will try to wean it off in the emergency room since blood pressure has been very nicely controlled. -Continue home meds  Pharmacy tech needs to do medication reconciliation tomorrow. No tech available at this time  4. Genella Rife continue PPI  5. COPD O2 sats are stable  6. DVT prophylaxis subcu Lovenox  Discussed with patient and granddaughter who was present in the ER   All the records are reviewed and case discussed with ED provider. Management plans discussed with the patient, family and they are in agreement.  CODE STATUS: DNR  TOTAL TIME TAKING CARE OF THIS PATIENT: 50 minutes.    Enedina Finner M.D on 02/19/2018 at 7:07 PM  Between 7am to 6pm - Pager - [phone]  After 6pm go to [email or web address] - password [email or web address] [facility]  [facility]  Office  [phone]  CC: Primary care physician; [facility]

## 2018-02-19 NOTE — ED Notes (Signed)
Report given to Pattricia Boss, RN, pt transported to room 6 by [employer], [facility].

## 2018-02-19 NOTE — ED Notes (Signed)
Dr. Roxan Hockey gave a verbal order to decrease the Cardene drip with the intent of taking the Pt off of it.

## 2018-02-19 NOTE — ED Notes (Signed)
Pharmacy notified that that SOLU-CORTEF injection needed as soon as possible. Pharmacy verified that they would send as soon as they could.

## 2018-02-19 NOTE — ED Provider Notes (Signed)
[facility] Emergency Department Provider Note   ____________________________________________   First MD Initiated Contact with Patient 02/19/18 1337     (approximate)  I have reviewed the triage vital signs and the nursing notes.   HISTORY  Chief Complaint Back Pain    HPI Eileen Mack is a 82 y.o. female patient complain of back pain for 2 to 3 weeks.  Patient onset of pain occurred when she was bending, lifting/ twisting movements while packing boxes.  Patient states radicular component to her pain to the right lower extremity.  Patient denies bladder bowel dysfunction.  Patient rates the pain as a 10/10.  Patient described the pain is "aching".  Patient denies chest or abdominal pain.  No palliative measures for complaint.   Past Medical History:  Diagnosis Date  . Anemia   . Atrial fibrillation (HCC)   . COPD (chronic obstructive pulmonary disease) (HCC)   . GERD (gastroesophageal reflux disease)   . History of blood clots   . Hypertension   . Personal history of tobacco use, presenting hazards to health 07/01/2015  . Renal disorder     Patient Active Problem List   Diagnosis Date Noted  . Essential hypertension 10/10/2017  . Hyperlipidemia 10/10/2017  . AAA (abdominal aortic aneurysm) without rupture (HCC) 10/10/2017  . Pain in both lower extremities 10/10/2017  . Varicose veins of both lower extremities with pain 10/10/2017  . Claudication (HCC) 10/10/2017  . Severe anemia 07/29/2015  . Personal history of tobacco use, presenting hazards to health 07/01/2015    Past Surgical History:  Procedure Laterality Date  . ABDOMINAL HYSTERECTOMY    . CARDIAC SURGERY    . CHOLECYSTECTOMY    . COLONOSCOPY WITH PROPOFOL Bilateral 08/01/2015   Procedure: COLONOSCOPY WITH PROPOFOL;  Surgeon: Scot Jun, MD;  Location: [facility] ENDOSCOPY;  Service: Endoscopy;  Laterality: Bilateral;  . ESOPHAGOGASTRODUODENOSCOPY N/A 07/31/2015   Procedure:  ESOPHAGOGASTRODUODENOSCOPY (EGD);  Surgeon: Scot Jun, MD;  Location: [facility] ENDOSCOPY;  Service: Endoscopy;  Laterality: N/A;    Prior to Admission medications   Medication Sig Start Date End Date Taking? Authorizing Provider  aspirin EC 81 MG tablet Take 81 mg by mouth daily.    [provider]  carvedilol (COREG) 12.5 MG tablet Take 12.5 mg by mouth 2 (two) times daily with a meal.    [provider]  cholestyramine light (PREVALITE) 4 g packet Take 4 g by mouth daily.    [provider]  clonazePAM (KLONOPIN) 0.5 MG tablet Take 0.5 mg by mouth at bedtime.    [provider]  cloNIDine (CATAPRES) 0.2 MG tablet Take 0.2 mg by mouth 2 (two) times daily.    [provider]  diphenhydramine-acetaminophen (TYLENOL PM) 25-500 MG TABS tablet Take 1 tablet by mouth at bedtime.    [provider]  ferrous sulfate 325 (65 FE) MG tablet Take 1 tablet (325 mg total) by mouth daily with breakfast. 08/01/15   Adrian Saran, MD  gabapentin (NEURONTIN) 300 MG capsule Take 600 mg by mouth 2 (two) times daily.    [provider]  gemfibrozil (LOPID) 600 MG tablet Take 600 mg by mouth 2 (two) times daily before a meal.    [provider]  levothyroxine (SYNTHROID, LEVOTHROID) 100 MCG tablet Take 100 mcg by mouth daily before breakfast.    [provider]  lisinopril (PRINIVIL,ZESTRIL) 20 MG tablet Take 20 mg by mouth daily.    [provider]  Multiple  Vitamins-Minerals (MULTIVITAMIN) tablet Take 1 tablet by mouth daily. 08/01/15   Adrian Saran, MD  nitroGLYCERIN (NITROSTAT) 0.4 MG SL tablet Place 0.4 mg under the tongue every 5 (five) minutes as needed for chest pain.    [provider]  omeprazole (PRILOSEC) 20 MG capsule Take by mouth. 05/16/17 03/22/18  [provider]  pantoprazole (PROTONIX) 40 MG tablet Take 1 tablet (40 mg total) by mouth daily. 08/01/15   Adrian Saran, MD  pravastatin (PRAVACHOL) 40 MG  tablet Take 40 mg by mouth at bedtime.    [provider]  ranitidine (ZANTAC) 150 MG capsule  08/21/17   [provider]  venlafaxine XR (EFFEXOR-XR) 75 MG 24 hr capsule Take 75-150 mg by mouth 2 (two) times daily. Pt. Takes 2 tablets every morning and 1 tablet every evening.    [provider]    Allergies Iodinated diagnostic agents and Sulfa antibiotics  No family history on file.  Social History Social History   Tobacco Use  . Smoking status: Current Every Day Smoker    Packs/day: 1.00    Years: 60.00    Pack years: 60.00  . Smokeless tobacco: Never Used  Substance Use Topics  . Alcohol use: Not on file  . Drug use: Not on file    Review of Systems Constitutional: No fever/chills Eyes: No visual changes. ENT: No sore throat. Cardiovascular: Denies chest pain.  History of abdominal aortic aneurysm.  Bilateral lower extremity varicose veins.  Claudication. Respiratory: Denies shortness of breath. Gastrointestinal: No abdominal pain.  No nausea, no vomiting.  No diarrhea.  No constipation. Genitourinary: Negative for dysuria. Musculoskeletal: Positive for back pain. Skin: Negative for rash. Neurological: Negative for headaches, focal weakness or numbness. Endocrine:Hyperlipidemia and hypertension. Hematological/Lymphatic: Allergic/Immunilogical: Sulfur and IV dyes. ____________________________________________   PHYSICAL EXAM:  VITAL SIGNS: ED Triage Vitals  Enc Vitals Group     BP 02/19/18 1250 (!) 123/56     Pulse Rate 02/19/18 1250 (!) 59     Resp 02/19/18 1250 18     Temp 02/19/18 1250 98.3 F (36.8 C)     Temp Source 02/19/18 1250 Oral     SpO2 02/19/18 1250 99 %     Weight 02/19/18 1251 164 lb (74.4 kg)     Height 02/19/18 1251 5\' 6"  (1.676 m)     Head Circumference --      Peak Flow --      Pain Score 02/19/18 1251 10     Pain Loc --      Pain Edu? --      Excl. in GC? --    Constitutional: Alert and oriented. Well  appearing and in no acute distress. Cardiovascular: Normal rate, regular rhythm. Grossly normal heart sounds.  Good peripheral circulation. Respiratory: Normal respiratory effort.  No retractions. Lungs CTAB. Gastrointestinal: Soft and nontender. No distention. No abdominal bruits. No CVA tenderness. Musculoskeletal: No obvious spinal deformity.  No lower extremity tenderness nor edema.  No joint effusions. Neurologic:  Normal speech and language. No gross focal neurologic deficits are appreciated. No gait instability. Skin:  Skin is warm, dry and intact. No rash noted. Psychiatric: Mood and affect are normal. Speech and behavior are normal.  ____________________________________________   LABS (all labs ordered are listed, but only abnormal results are displayed)  Labs Reviewed - No data to display ____________________________________________  EKG   ____________________________________________  RADIOLOGY  ED MD interpretation:  Official radiology report(s): Dg Lumbar Spine 2-3 Views  Result Date: 02/19/2018 CLINICAL  DATA:  Mid-low back pain EXAM: LUMBAR SPINE - 2-3 VIEW COMPARISON:  CT 07/29/2015 FINDINGS: Surgical clips in the right upper quadrant. Mild scoliosis of the spine. Vertebral body heights are maintained. Moderate degenerative changes at L3-L4 and L4-L5 with mild degenerative change at L5-S1. Calcified distal aortic aneurysm measuring 5.8 cm AP. Additional calcifications in the left upper quadrant, likely vascular. IMPRESSION: 1. Mild scoliosis of the spine with multiple level degenerative change. No acute osseous abnormality. 2. 5.8 cm abdominal aortic aneurysm, appears increased since CT from 07/29/2015 at which time aneurysm measured approximately 4.2 cm. CTA follow-up is recommended. Electronically Signed   By: Jasmine Pang M.D.   On: 02/19/2018 14:41    ____________________________________________   PROCEDURES  Procedure(s) performed:  None  Procedures  Critical Care performed: No  ____________________________________________   INITIAL IMPRESSION / ASSESSMENT AND PLAN / ED COURSE  As part of my medical decision making, I reviewed the following data within the electronic MEDICAL RECORD NUMBER    Patient presents with increased back pain.  X-ray and ultrasound showed increased with patient abdominal aneurysm.  Patient will be transferred over to the measures sided ED for CT scanning of the chest and abdomen and evaluation by vascular surgeon.      ____________________________________________   FINAL CLINICAL IMPRESSION(S) / ED DIAGNOSES  Final diagnoses:  History of abdominal aortic aneurysm (AAA)     ED Discharge Orders    None       Note:  This document was prepared using Dragon voice recognition software and may include unintentional dictation errors.    Joni Reining, PA-C 02/19/18 1647    Willy Eddy, MD 02/19/18 [phone]

## 2018-02-19 NOTE — Progress Notes (Signed)
Family Meeting Note  Advance Directive:pt and grand dter  Patient came in with increasing back pain and radiating to the sides and right side. Patient was found to have increasing size of AAA. Seen and evaluated by Dr. Gilda Crease in the ER. Patient is scheduled for procedure Wednesday. I discuss code status with patient she wishes to be a DNR. This was discussed with patient's granddaughter who was present in the ER.   Time spent during discussion:17 mins Enedina Finner, MD

## 2018-02-20 ENCOUNTER — Inpatient Hospital Stay
Admit: 2018-02-20 | Discharge: 2018-02-20 | Disposition: A | Discharge status: Home / Self Care | Payer: Medicare HMO | Attending: Internal Medicine | Admitting: Internal Medicine

## 2018-02-20 LAB — ECHOCARDIOGRAM COMPLETE
Height: 67 in
Weight: 2640 oz

## 2018-02-20 LAB — SURGICAL PCR SCREEN
MRSA, PCR: NEGATIVE
Staphylococcus aureus: NEGATIVE

## 2018-02-20 MED ORDER — CLONIDINE HCL 0.1 MG PO TABS
0.1000 mg | ORAL_TABLET | Freq: Two times a day (BID) | ORAL | Status: DC
  Administered 2018-02-20 – 2018-02-22 (×3): 0.1 mg via ORAL
  Filled 2018-02-20 (×3): qty 1

## 2018-02-20 MED ORDER — VENLAFAXINE HCL ER 75 MG PO CP24
225.0000 mg | ORAL_CAPSULE | Freq: Every day | ORAL | Status: DC
  Administered 2018-02-20 – 2018-02-22 (×2): 225 mg via ORAL
  Filled 2018-02-20 (×2): qty 3

## 2018-02-20 MED ORDER — CEFAZOLIN SODIUM-DEXTROSE 2-4 GM/100ML-% IV SOLN
2.0000 g | INTRAVENOUS | Status: AC
  Administered 2018-02-21: 2 g via INTRAVENOUS
  Filled 2018-02-20: qty 100

## 2018-02-20 MED ORDER — SODIUM CHLORIDE 0.9 % IV SOLN
INTRAVENOUS | Status: DC
  Administered 2018-02-20 – 2018-02-21 (×2): via INTRAVENOUS

## 2018-02-20 MED ORDER — NITROGLYCERIN 0.4 MG SL SUBL: 0.4000 mg | SUBLINGUAL_TABLET | SUBLINGUAL | Status: DC | PRN

## 2018-02-20 NOTE — Consult Note (Signed)
[facility] Cardiology  CARDIOLOGY CONSULT NOTE  Patient ID: Eileen Mack MRN: [id number] DOB/AGE: May 10, 1935 82 y.o.  Admit date: 02/19/2018 Referring Physician [facility] Primary Physician [facility] Primary Cardiologist Fath Reason for Consultation Preoperative cardiovascular evaluation  HPI: 82 year old female referred for preoperative cardiovascular evaluation prior to AAA repair. The patient has a history of coronary artery disease, status post PCI of LAD and RCA, paroxysmal atrial fibrillation, status post radiofrequency ablation in 2009 and 2011, now off on warfarin, tobacco abuse, hypertension, and AAA. Cardiac catheterization in 2009 revealed patents stents with 50% stenosis mid LAD. Cardiac MRI in 2013 was negative for infarction or ischemia. The patient presented to [facility] ER yesterday for 10/10 low back pain, which started weeks ago after packing and loading boxes. CT scan in the ER revealed incidental finding of AAA measuring 4.8 cm, deeming her a candidate for endovascular repair. The patient reports experiencing rare, intermittent chest pain, which is brief in nature, but she cannot recall the last episode of chest pain. She has not had shortness of breath lately. She states that she is a fairly active individual, especially for her age. She has been compliant with her medications.   Review of systems complete and found to be negative unless listed above     Past Medical History:  Diagnosis Date  . Anemia   . Atrial fibrillation (HCC)   . COPD (chronic obstructive pulmonary disease) (HCC)   . GERD (gastroesophageal reflux disease)   . History of blood clots   . Hypertension   . Personal history of tobacco use, presenting hazards to health 07/01/2015  . Renal disorder     Past Surgical History:  Procedure Laterality Date  . ABDOMINAL HYSTERECTOMY    . CARDIAC SURGERY    . CHOLECYSTECTOMY    . COLONOSCOPY WITH PROPOFOL Bilateral 08/01/2015   Procedure: COLONOSCOPY WITH  PROPOFOL;  Surgeon: Scot Jun, MD;  Location: [facility] ENDOSCOPY;  Service: Endoscopy;  Laterality: Bilateral;  . ESOPHAGOGASTRODUODENOSCOPY N/A 07/31/2015   Procedure: ESOPHAGOGASTRODUODENOSCOPY (EGD);  Surgeon: Scot Jun, MD;  Location: [facility] ENDOSCOPY;  Service: Endoscopy;  Laterality: N/A;    Medications Prior to Admission  Medication Sig Dispense Refill Last Dose  . aspirin EC 81 MG tablet Take 81 mg by mouth daily.   Taking  . carvedilol (COREG) 12.5 MG tablet Take 12.5 mg by mouth 2 (two) times daily with a meal.   Taking  . cholestyramine light (PREVALITE) 4 g packet Take 4 g by mouth daily.   Taking  . clonazePAM (KLONOPIN) 0.5 MG tablet Take 0.5 mg by mouth at bedtime.   Taking  . cloNIDine (CATAPRES) 0.2 MG tablet Take 0.2 mg by mouth 2 (two) times daily.   Taking  . diphenhydramine-acetaminophen (TYLENOL PM) 25-500 MG TABS tablet Take 1 tablet by mouth at bedtime.   Taking  . ferrous sulfate 325 (65 FE) MG tablet Take 1 tablet (325 mg total) by mouth daily with breakfast. 60 tablet 3 Taking  . gabapentin (NEURONTIN) 300 MG capsule Take 600 mg by mouth 2 (two) times daily.   Taking  . gemfibrozil (LOPID) 600 MG tablet Take 600 mg by mouth 2 (two) times daily before a meal.   Taking  . levothyroxine (SYNTHROID, LEVOTHROID) 100 MCG tablet Take 100 mcg by mouth daily before breakfast.   Taking  . lisinopril (PRINIVIL,ZESTRIL) 20 MG tablet Take 20 mg by mouth daily.   Taking  . Multiple Vitamins-Minerals (MULTIVITAMIN) tablet Take 1 tablet by mouth daily. 90  tablet 0 Taking  . nitroGLYCERIN (NITROSTAT) 0.4 MG SL tablet Place 0.4 mg under the tongue every 5 (five) minutes as needed for chest pain.   Taking  . omeprazole (PRILOSEC) 20 MG capsule Take by mouth.   Taking  . pantoprazole (PROTONIX) 40 MG tablet Take 1 tablet (40 mg total) by mouth daily. 30 tablet 0 Taking  . pravastatin (PRAVACHOL) 40 MG tablet Take 40 mg by mouth at bedtime.   Taking  . ranitidine (ZANTAC) 150 MG  capsule   0 Taking  . venlafaxine XR (EFFEXOR-XR) 75 MG 24 hr capsule Take 75-150 mg by mouth 2 (two) times daily. Pt. Takes 2 tablets every morning and 1 tablet every evening.   Taking   Social History   Socioeconomic History  . Marital status: Widowed    Spouse name: Not on file  . Number of children: Not on file  . Years of education: Not on file  . Highest education level: Not on file  Occupational History  . Not on file  Social Needs  . Financial resource strain: Not on file  . Food insecurity:    Worry: Not on file    Inability: Not on file  . Transportation needs:    Medical: Not on file    Non-medical: Not on file  Tobacco Use  . Smoking status: Current Every Day Smoker    Packs/day: 1.00    Years: 60.00    Pack years: 60.00  . Smokeless tobacco: Never Used  Substance and Sexual Activity  . Alcohol use: Not on file  . Drug use: Not on file  . Sexual activity: Not on file  Lifestyle  . Physical activity:    Days per week: Not on file    Minutes per session: Not on file  . Stress: Not on file  Relationships  . Social connections:    Talks on phone: Not on file    Gets together: Not on file    Attends religious service: Not on file    Active member of club or organization: Not on file    Attends meetings of clubs or organizations: Not on file    Relationship status: Not on file  . Intimate partner violence:    Fear of current or ex partner: Not on file    Emotionally abused: Not on file    Physically abused: Not on file    Forced sexual activity: Not on file  Other Topics Concern  . Not on file  Social History Narrative  . Not on file    History reviewed. No pertinent family history.    Review of systems complete and found to be negative unless listed above      PHYSICAL EXAM  General: Well developed, well nourished, in no acute distress, sitting upright on side of bed eating breakfast HEENT:  Normocephalic and atramatic Neck:  No JVD.  Lungs:  Clear bilaterally to auscultation, normal effort of breathing Heart: HRRR . Normal S1 and S2 without gallops or murmurs.  Abdomen: nondistended Msk:  Back normal, sitting upright on side of bed. Normal strength and tone for age. Extremities: No clubbing, cyanosis or edema.   Neuro: Alert and oriented X 3. Psych:  Good affect, responds appropriately  Labs:   Lab Results  Component Value Date   WBC 5.6 02/19/2018   HGB 12.5 02/19/2018   HCT 38.7 02/19/2018   MCV 95.6 02/19/2018   PLT 311 02/19/2018    Recent Labs  Lab 02/19/18 1653  NA 140  K 3.6  CL 106  CO2 27  BUN 19  CREATININE 0.94  CALCIUM 9.5  PROT 7.4  BILITOT 0.5  ALKPHOS 87  ALT 11  AST 17  GLUCOSE 96   Lab Results  Component Value Date   TROPONINI <0.03 07/29/2015   No results found for: CHOL No results found for: HDL No results found for: LDLCALC No results found for: TRIG No results found for: CHOLHDL No results found for: LDLDIRECT    Radiology: Ct Abdomen Pelvis Wo Contrast  Result Date: 02/19/2018 CLINICAL DATA:  Abdominal aortic aneurysm. Back pain and hypertension. Ultrasound demonstrating possible increased size. EXAM: CT ANGIOGRAPHY CHEST, ABDOMEN AND PELVIS TECHNIQUE: Multidetector CT imaging through the chest, abdomen and pelvis was performed using the standard protocol during bolus administration of intravenous contrast. Precontrast images were also performed. Multiplanar reconstructed images and MIPs were obtained and reviewed to evaluate the vascular anatomy. CONTRAST:  75mL ISOVUE-370 IOPAMIDOL (ISOVUE-370) INJECTION 76% COMPARISON:  Ultrasound of earlier in the day. Most recent CT abdomen pelvis 07/29/2015. Chest CT 07/03/2015 reviewed. FINDINGS: CTA CHEST FINDINGS Cardiovascular: No intramural hematoma. Advanced aortic and branch vessel atherosclerosis. Extensive ulcerative plaque in the descending aorta. No aortic aneurysm or dissection. Normal heart size, without pericardial effusion.  Multivessel coronary artery atherosclerosis. No central pulmonary embolism, on this non-dedicated study. Mediastinum/Nodes: No mediastinal or hilar adenopathy. Lungs/Pleura: No pleural fluid. Mild centrilobular emphysema. Right lower lobe 7 mm ground-glass nodule is similar to minimally enlarged from 6 mm on the prior exam. Example image 93/14 today. A superior segment left lower lobe well-circumscribed pulmonary nodule measures 7 mm on image 49/14 and is similar to 07/03/2015, presumably benign. Other soft tissue density pulmonary nodules are on the order of 5-6 mm and less are identified on series 14. These are felt to be similar. Musculoskeletal: No acute osseous abnormality. Review of the MIP images confirms the above findings. CTA ABDOMEN AND PELVIS FINDINGS VASCULAR Aorta: Advanced abdominal aortic and branch vessel atherosclerosis. The infrarenal aorta measures maximally 4.8 x 4.6 cm on image 128/12. Compare 4.0 x 4.2 cm on 07/29/2015. Extensive wall thrombus within. No periaortic hemorrhage or evidence of dissection. Celiac: Widely patent. SMA: Atherosclerotic without significant stenosis. Renals: Suspect mild left renal artery narrowing secondary to atherosclerosis. Single renal arteries bilaterally. IMA: Not confidently visualized proximally. Suspect chronically occluded. Inflow: No iliac artery aneurysm or significant stenosis. Veins: Patent where opacified. Review of the MIP images confirms the above findings. NON-VASCULAR Hepatobiliary: Normal liver. Cholecystectomy, without biliary ductal dilatation. Pancreas: Normal, without mass or ductal dilatation. Spleen: Hypoattenuating lesion within the central spleen is of doubtful clinical significance. Adrenals/Urinary Tract: Normal adrenal glands. No renal calculi or hydronephrosis. No bladder stone. No renal mass on post-contrast imaging. No enhancing bladder mass. Stomach/Bowel: The stomach is underdistended. Greater curvature appears thick walled  proximally including at 3.2 cm. Periampullary duodenal diverticulum. Scattered colonic diverticula. Otherwise normal small bowel. Lymphatic: No abdominopelvic adenopathy. Reproductive: Hysterectomy.  No adnexal mass. Other: No significant free fluid. Mild pelvic floor laxity. No free intraperitoneal air. Musculoskeletal: Lumbosacral spondylosis. Review of the MIP images confirms the above findings. IMPRESSION: 1. Since 07/29/2015, increase in infrarenal abdominal aortic aneurysm, without surrounding hemorrhage or other acute complication. 2. Bilateral pulmonary nodules are primarily similar to 2017. There is a ground-glass nodule which may have enlarged minimally since the prior. Initial follow-up with CT at 6-12 months is recommended to confirm persistence. If persistent, repeat CT is recommended every 2 years until 5 years of stability  has been established. This recommendation follows the consensus statement: Guidelines for Management of Incidental Pulmonary Nodules Detected on CT Images: From the Fleischner Society 2017; Radiology 2017; 284:228-243. 3. Aortic atherosclerosis (ICD10-I70.0), coronary artery atherosclerosis and emphysema (ICD10-J43.9). 4. Apparent greater curvature gastric wall thickening is at least partially felt to be due to underdistention. Correlate with any symptoms of gastritis. Electronically Signed   By: Jeronimo Greaves M.D.   On: 02/19/2018 18:35   Dg Lumbar Spine 2-3 Views  Result Date: 02/19/2018 CLINICAL DATA:  Mid-low back pain EXAM: LUMBAR SPINE - 2-3 VIEW COMPARISON:  CT 07/29/2015 FINDINGS: Surgical clips in the right upper quadrant. Mild scoliosis of the spine. Vertebral body heights are maintained. Moderate degenerative changes at L3-L4 and L4-L5 with mild degenerative change at L5-S1. Calcified distal aortic aneurysm measuring 5.8 cm AP. Additional calcifications in the left upper quadrant, likely vascular. IMPRESSION: 1. Mild scoliosis of the spine with multiple level  degenerative change. No acute osseous abnormality. 2. 5.8 cm abdominal aortic aneurysm, appears increased since CT from 07/29/2015 at which time aneurysm measured approximately 4.2 cm. CTA follow-up is recommended. Electronically Signed   By: Jasmine Pang M.D.   On: 02/19/2018 14:41   [place] Aaa Duplex Limited  Result Date: 02/19/2018 CLINICAL DATA:  Known abdominal aortic aneurysm with possible enlargement by lumbar spine radiograph today. EXAM: ULTRASOUND OF ABDOMINAL AORTA TECHNIQUE: Ultrasound examination of the abdominal aorta was performed to evaluate for abdominal aortic aneurysm. COMPARISON:  Lumbar spine x-rays earlier today as well as ultrasound of the aorta on 08/25/2017 and CT of the abdomen and pelvis without contrast on 07/29/2015. FINDINGS: Abdominal aortic measurements as follows: Proximal:  2.7 x 2.0 cm cm Mid:  2.6 x 1.9 cm cm Distal:  5.1 x 5.5 cm cm Visualized proximal common iliac artery origins appear normal in caliber. Fusiform aneurysmal disease of the distal abdominal aorta does appear to be slightly larger by ultrasound over the last 6 months. The most accurate representation of aneurysmal disease would be by CTA of the abdomen and pelvis with contrast. IMPRESSION: Probable enlargement of abdominal aortic aneurysm since prior ultrasound 6 months ago with maximal diameter now estimated to be just over 5 cm by ultrasound. Given maximal diameter of greater than 5 cm as well as evidence of interval growth, CTA of the abdomen and pelvis with contrast is indicated as well as referral to vascular surgery. Electronically Signed   By: Irish Lack M.D.   On: 02/19/2018 16:18   Ct Angio Chest Aorta W/cm &/or Wo/cm  Result Date: 02/19/2018 CLINICAL DATA:  Abdominal aortic aneurysm. Back pain and hypertension. Ultrasound demonstrating possible increased size. EXAM: CT ANGIOGRAPHY CHEST, ABDOMEN AND PELVIS TECHNIQUE: Multidetector CT imaging through the chest, abdomen and pelvis was performed  using the standard protocol during bolus administration of intravenous contrast. Precontrast images were also performed. Multiplanar reconstructed images and MIPs were obtained and reviewed to evaluate the vascular anatomy. CONTRAST:  75mL ISOVUE-370 IOPAMIDOL (ISOVUE-370) INJECTION 76% COMPARISON:  Ultrasound of earlier in the day. Most recent CT abdomen pelvis 07/29/2015. Chest CT 07/03/2015 reviewed. FINDINGS: CTA CHEST FINDINGS Cardiovascular: No intramural hematoma. Advanced aortic and branch vessel atherosclerosis. Extensive ulcerative plaque in the descending aorta. No aortic aneurysm or dissection. Normal heart size, without pericardial effusion. Multivessel coronary artery atherosclerosis. No central pulmonary embolism, on this non-dedicated study. Mediastinum/Nodes: No mediastinal or hilar adenopathy. Lungs/Pleura: No pleural fluid. Mild centrilobular emphysema. Right lower lobe 7 mm ground-glass nodule is similar to minimally enlarged from 6 mm  on the prior exam. Example image 93/14 today. A superior segment left lower lobe well-circumscribed pulmonary nodule measures 7 mm on image 49/14 and is similar to 07/03/2015, presumably benign. Other soft tissue density pulmonary nodules are on the order of 5-6 mm and less are identified on series 14. These are felt to be similar. Musculoskeletal: No acute osseous abnormality. Review of the MIP images confirms the above findings. CTA ABDOMEN AND PELVIS FINDINGS VASCULAR Aorta: Advanced abdominal aortic and branch vessel atherosclerosis. The infrarenal aorta measures maximally 4.8 x 4.6 cm on image 128/12. Compare 4.0 x 4.2 cm on 07/29/2015. Extensive wall thrombus within. No periaortic hemorrhage or evidence of dissection. Celiac: Widely patent. SMA: Atherosclerotic without significant stenosis. Renals: Suspect mild left renal artery narrowing secondary to atherosclerosis. Single renal arteries bilaterally. IMA: Not confidently visualized proximally. Suspect  chronically occluded. Inflow: No iliac artery aneurysm or significant stenosis. Veins: Patent where opacified. Review of the MIP images confirms the above findings. NON-VASCULAR Hepatobiliary: Normal liver. Cholecystectomy, without biliary ductal dilatation. Pancreas: Normal, without mass or ductal dilatation. Spleen: Hypoattenuating lesion within the central spleen is of doubtful clinical significance. Adrenals/Urinary Tract: Normal adrenal glands. No renal calculi or hydronephrosis. No bladder stone. No renal mass on post-contrast imaging. No enhancing bladder mass. Stomach/Bowel: The stomach is underdistended. Greater curvature appears thick walled proximally including at 3.2 cm. Periampullary duodenal diverticulum. Scattered colonic diverticula. Otherwise normal small bowel. Lymphatic: No abdominopelvic adenopathy. Reproductive: Hysterectomy.  No adnexal mass. Other: No significant free fluid. Mild pelvic floor laxity. No free intraperitoneal air. Musculoskeletal: Lumbosacral spondylosis. Review of the MIP images confirms the above findings. IMPRESSION: 1. Since 07/29/2015, increase in infrarenal abdominal aortic aneurysm, without surrounding hemorrhage or other acute complication. 2. Bilateral pulmonary nodules are primarily similar to 2017. There is a ground-glass nodule which may have enlarged minimally since the prior. Initial follow-up with CT at 6-12 months is recommended to confirm persistence. If persistent, repeat CT is recommended every 2 years until 5 years of stability has been established. This recommendation follows the consensus statement: Guidelines for Management of Incidental Pulmonary Nodules Detected on CT Images: From the Fleischner Society 2017; Radiology 2017; 284:228-243. 3. Aortic atherosclerosis (ICD10-I70.0), coronary artery atherosclerosis and emphysema (ICD10-J43.9). 4. Apparent greater curvature gastric wall thickening is at least partially felt to be due to underdistention.  Correlate with any symptoms of gastritis. Electronically Signed   By: Kyle  Talbot M.D.   On: 02/19/2018 18:35   Ct Angio Abd/pel W And/or Wo Contrast  Result Date: 02/19/2018 CLINICAL DATA:  Abdominal aortic aneurysm. Back pain and hypertension. Ultrasound demonstrating possible increased size. EXAM: CT ANGIOGRAPHY CHEST, ABDOMEN AND PELVIS TECHNIQUE: Multidetector CT imaging through the chest, abdomen and pelvis was performed using the standard protocol during bolus administration of intravenous contrast. Precontrast images were also performed. Multiplanar reconstructed images and MIPs were obtained and reviewed to evaluate the vascular anatomy. CONTRAST:  2mL ISOVUE-370 IOPAMIDOL (ISOVUE-370) INJECTION 76% COMPARISON:  Ultrasound of earlier in the day. Most recent CT abdomen pelvis 07/29/2015. Chest CT 07/03/2015 reviewed. FINDINGS: CTA CHEST FINDINGS Cardiovascular: No intramural hematoma. Advanced aortic and branch vessel atherosclerosis. Extensive ulcerative plaque in the descending aorta. No aortic aneurysm or dissection. Normal heart size, without pericardial effusion. Multivessel coronary artery atherosclerosis. No central pulmonary embolism, on this non-dedicated study. Mediastinum/Nodes: No mediastinal or hilar adenopathy. Lungs/Pleura: No pleural fluid. Mild centrilobular emphysema. Right lower lobe 7 mm ground-glass nodule is similar to minimally enlarged from 6 mm on the prior exam. Example image 93/14 today.  A superior segment left lower lobe well-circumscribed pulmonary nodule measures 7 mm on image 49/14 and is similar to 07/03/2015, presumably benign. Other soft tissue density pulmonary nodules are on the order of 5-6 mm and less are identified on series 14. These are felt to be similar. Musculoskeletal: No acute osseous abnormality. Review of the MIP images confirms the above findings. CTA ABDOMEN AND PELVIS FINDINGS VASCULAR Aorta: Advanced abdominal aortic and branch vessel  atherosclerosis. The infrarenal aorta measures maximally 4.8 x 4.6 cm on image 128/12. Compare 4.0 x 4.2 cm on 07/29/2015. Extensive wall thrombus within. No periaortic hemorrhage or evidence of dissection. Celiac: Widely patent. SMA: Atherosclerotic without significant stenosis. Renals: Suspect mild left renal artery narrowing secondary to atherosclerosis. Single renal arteries bilaterally. IMA: Not confidently visualized proximally. Suspect chronically occluded. Inflow: No iliac artery aneurysm or significant stenosis. Veins: Patent where opacified. Review of the MIP images confirms the above findings. NON-VASCULAR Hepatobiliary: Normal liver. Cholecystectomy, without biliary ductal dilatation. Pancreas: Normal, without mass or ductal dilatation. Spleen: Hypoattenuating lesion within the central spleen is of doubtful clinical significance. Adrenals/Urinary Tract: Normal adrenal glands. No renal calculi or hydronephrosis. No bladder stone. No renal mass on post-contrast imaging. No enhancing bladder mass. Stomach/Bowel: The stomach is underdistended. Greater curvature appears thick walled proximally including at 3.2 cm. Periampullary duodenal diverticulum. Scattered colonic diverticula. Otherwise normal small bowel. Lymphatic: No abdominopelvic adenopathy. Reproductive: Hysterectomy.  No adnexal mass. Other: No significant free fluid. Mild pelvic floor laxity. No free intraperitoneal air. Musculoskeletal: Lumbosacral spondylosis. Review of the MIP images confirms the above findings. IMPRESSION: 1. Since 07/29/2015, increase in infrarenal abdominal aortic aneurysm, without surrounding hemorrhage or other acute complication. 2. Bilateral pulmonary nodules are primarily similar to 2017. There is a ground-glass nodule which may have enlarged minimally since the prior. Initial follow-up with CT at 6-12 months is recommended to confirm persistence. If persistent, repeat CT is recommended every 2 years until 5 years of  stability has been established. This recommendation follows the consensus statement: Guidelines for Management of Incidental Pulmonary Nodules Detected on CT Images: From the Fleischner Society 2017; Radiology 2017; 284:228-243. 3. Aortic atherosclerosis (ICD10-I70.0), coronary artery atherosclerosis and emphysema (ICD10-J43.9). 4. Apparent greater curvature gastric wall thickening is at least partially felt to be due to underdistention. Correlate with any symptoms of gastritis. Electronically Signed   By: Jeronimo Greaves M.D.   On: 02/19/2018 18:35    EKG: Sinus rhythm   ASSESSMENT AND PLAN:  1. AAA without rupture, requiring preoperative cardiovascular evaluation prior to endovascular repair scheduled for 02/21/18.  2. Known coronary artery disease, status post PCI LAD and RCA, with cardiac catheterization in 2009 revealing patent stents with 50% stenosis mid LAD. Cardiac MRI negative for ischemia or infarction. The patient reports rare episodes of chest pain and cannot recall the last episode. She has been medically optimized thus far. 3. Paroxsymal atrial fibrillation, status post 2 radiofrequency ablations, 2009, and 2001, with successful return to sinus rhythm. Now off on warfarin. 4. Hypertension, initially elevated in ER, now well controlled 5. Hyperlipidemia, on pravastatin  Recommendations: 1. Agree with overall therapy 2. Obtain 2D echocardiogram to assess for structural/functional abnormalities 3. Continue aspirin, carvedilol, and lisinopril 4. The patient has been medically optimized thus far and has been minimally symptomatic from a cardiovascular perspective. Further recommendations pending results of echocardiogram.  Signed: Leanora Ivanoff PA-C 02/20/2018, 9:14 AM

## 2018-02-20 NOTE — Progress Notes (Signed)
Chaplain responded to an OR for spiritual support. Pt said she had a "full bucket" yesterday but today her "bucket is half filled" She said her day was better regarding emotions. Chaplain practiced pastoral presence and active listening. Pt was in a good mood but with bouts of pain when she moved. Pt had visitors to come in the room and Chaplain offered prayer and it was accepted.    02/20/18 1300  Clinical Encounter Type  Visited With Patient;Family  Visit Type Initial;Spiritual support  Referral From Nurse  Spiritual Encounters  Spiritual Needs Prayer

## 2018-02-20 NOTE — Progress Notes (Signed)
*  PRELIMINARY RESULTS* Echocardiogram 2D Echocardiogram has been performed.  Cristela Blue 02/20/2018, 10:57 AM

## 2018-02-20 NOTE — Progress Notes (Signed)
2D echocardiogram revealed normal left ventricular function. The patient has been medically optimized. Proceed with endovascular repair of AAA. Recommend continuing carvedilol pre-, peri-, and postoperatively. This was discussed with Dr. Darrold Junker who agreed with the plan.

## 2018-02-20 NOTE — Plan of Care (Signed)
Patient was admitted 10/21 at 2100. Patient profile completed. Patient was treated for back pain in the ED. Patient is sad and tearful about current state of her family. Placed an order for spiritual consult.

## 2018-02-20 NOTE — Progress Notes (Signed)
Patient ID: Eileen Mack, female   DOB: 1935-08-01, 82 y.o.   MRN: [id number]  [facility] PROGRESS NOTE  Eileen Mack WUJ:[id number] DOB: Feb 05, 1936 DOA: 02/19/2018 PCP: [facility]  HPI/Subjective: Patient states her back pain is less than yesterday.  Hurts if she moves around.  States the pain is been going on for 3 weeks.  Hurts a little bit more on her right side.  Being off her feet helps.  Objective: Vitals:   02/20/18 0731 02/20/18 1101  BP: (!) 104/50 (!) 143/64  Pulse: 64 75  Resp: 18   Temp: 97.6 F (36.4 C)   SpO2: 98%     Filed Weights   02/19/18 1251 02/19/18 2109  Weight: 74.4 kg 74.8 kg    ROS: Review of Systems  Constitutional: Negative for chills and fever.  Eyes: Negative for blurred vision.  Respiratory: Negative for cough and shortness of breath.   Cardiovascular: Negative for chest pain.  Gastrointestinal: Negative for abdominal pain, constipation, diarrhea, nausea and vomiting.  Genitourinary: Negative for dysuria.  Musculoskeletal: Positive for back pain. Negative for joint pain.  Neurological: Negative for dizziness and headaches.   Exam: Physical Exam  HENT:  Nose: No mucosal edema.  Mouth/Throat: No oropharyngeal exudate or posterior oropharyngeal edema.  Eyes: Pupils are equal, round, and reactive to light. Conjunctivae, EOM and lids are normal.  Neck: No JVD present. Carotid bruit is not present. No edema present. No thyroid mass and no thyromegaly present.  Cardiovascular: S1 normal and S2 normal. Exam reveals no gallop.  No murmur heard. Pulses:      Dorsalis pedis pulses are 2+ on the right side, and 2+ on the left side.  Respiratory: No respiratory distress. She has no wheezes. She has no rhonchi. She has no rales.  GI: Soft. Bowel sounds are normal. There is no tenderness.  Musculoskeletal:       Right ankle: She exhibits no swelling.       Left ankle: She exhibits no swelling.  Unable to elicit  pain in the back when palpating  Lymphadenopathy:    She has no cervical adenopathy.  Neurological: She is alert. No cranial nerve deficit.  Patient able to straight leg raise without a problem  Skin: Skin is warm. No rash noted. Nails show no clubbing.  Psychiatric: She has a normal mood and affect.      Data Reviewed: Basic Metabolic Panel: Recent Labs  Lab 02/19/18 1653  NA 140  K 3.6  CL 106  CO2 27  GLUCOSE 96  BUN 19  CREATININE 0.94  CALCIUM 9.5   Liver Function Tests: Recent Labs  Lab 02/19/18 1653  AST 17  ALT 11  ALKPHOS 87  BILITOT 0.5  PROT 7.4  ALBUMIN 3.9   CBC: Recent Labs  Lab 02/19/18 1653  WBC 5.6  NEUTROABS 3.4  HGB 12.5  HCT 38.7  MCV 95.6  PLT 311     Studies: Ct Abdomen Pelvis Wo Contrast  Result Date: 02/19/2018 CLINICAL DATA:  Abdominal aortic aneurysm. Back pain and hypertension. Ultrasound demonstrating possible increased size. EXAM: CT ANGIOGRAPHY CHEST, ABDOMEN AND PELVIS TECHNIQUE: Multidetector CT imaging through the chest, abdomen and pelvis was performed using the standard protocol during bolus administration of intravenous contrast. Precontrast images were also performed. Multiplanar reconstructed images and MIPs were obtained and reviewed to evaluate the vascular anatomy. CONTRAST:  75mL ISOVUE-370 IOPAMIDOL (ISOVUE-370) INJECTION 76% COMPARISON:  Ultrasound of earlier in the day. Most recent CT abdomen  pelvis 07/29/2015. Chest CT 07/03/2015 reviewed. FINDINGS: CTA CHEST FINDINGS Cardiovascular: No intramural hematoma. Advanced aortic and branch vessel atherosclerosis. Extensive ulcerative plaque in the descending aorta. No aortic aneurysm or dissection. Normal heart size, without pericardial effusion. Multivessel coronary artery atherosclerosis. No central pulmonary embolism, on this non-dedicated study. Mediastinum/Nodes: No mediastinal or hilar adenopathy. Lungs/Pleura: No pleural fluid. Mild centrilobular emphysema. Right  lower lobe 7 mm ground-glass nodule is similar to minimally enlarged from 6 mm on the prior exam. Example image 93/14 today. A superior segment left lower lobe well-circumscribed pulmonary nodule measures 7 mm on image 49/14 and is similar to 07/03/2015, presumably benign. Other soft tissue density pulmonary nodules are on the order of 5-6 mm and less are identified on series 14. These are felt to be similar. Musculoskeletal: No acute osseous abnormality. Review of the MIP images confirms the above findings. CTA ABDOMEN AND PELVIS FINDINGS VASCULAR Aorta: Advanced abdominal aortic and branch vessel atherosclerosis. The infrarenal aorta measures maximally 4.8 x 4.6 cm on image 128/12. Compare 4.0 x 4.2 cm on 07/29/2015. Extensive wall thrombus within. No periaortic hemorrhage or evidence of dissection. Celiac: Widely patent. SMA: Atherosclerotic without significant stenosis. Renals: Suspect mild left renal artery narrowing secondary to atherosclerosis. Single renal arteries bilaterally. IMA: Not confidently visualized proximally. Suspect chronically occluded. Inflow: No iliac artery aneurysm or significant stenosis. Veins: Patent where opacified. Review of the MIP images confirms the above findings. NON-VASCULAR Hepatobiliary: Normal liver. Cholecystectomy, without biliary ductal dilatation. Pancreas: Normal, without mass or ductal dilatation. Spleen: Hypoattenuating lesion within the central spleen is of doubtful clinical significance. Adrenals/Urinary Tract: Normal adrenal glands. No renal calculi or hydronephrosis. No bladder stone. No renal mass on post-contrast imaging. No enhancing bladder mass. Stomach/Bowel: The stomach is underdistended. Greater curvature appears thick walled proximally including at 3.2 cm. Periampullary duodenal diverticulum. Scattered colonic diverticula. Otherwise normal small bowel. Lymphatic: No abdominopelvic adenopathy. Reproductive: Hysterectomy.  No adnexal mass. Other: No  significant free fluid. Mild pelvic floor laxity. No free intraperitoneal air. Musculoskeletal: Lumbosacral spondylosis. Review of the MIP images confirms the above findings. IMPRESSION: 1. Since 07/29/2015, increase in infrarenal abdominal aortic aneurysm, without surrounding hemorrhage or other acute complication. 2. Bilateral pulmonary nodules are primarily similar to 2017. There is a ground-glass nodule which may have enlarged minimally since the prior. Initial follow-up with CT at 6-12 months is recommended to confirm persistence. If persistent, repeat CT is recommended every 2 years until 5 years of stability has been established. This recommendation follows the consensus statement: Guidelines for Management of Incidental Pulmonary Nodules Detected on CT Images: From the Fleischner Society 2017; Radiology 2017; 284:228-243. 3. Aortic atherosclerosis (ICD10-I70.0), coronary artery atherosclerosis and emphysema (ICD10-J43.9). 4. Apparent greater curvature gastric wall thickening is at least partially felt to be due to underdistention. Correlate with any symptoms of gastritis. Electronically Signed   By: Jeronimo Greaves M.D.   On: 02/19/2018 18:35   Dg Lumbar Spine 2-3 Views  Result Date: 02/19/2018 CLINICAL DATA:  Mid-low back pain EXAM: LUMBAR SPINE - 2-3 VIEW COMPARISON:  CT 07/29/2015 FINDINGS: Surgical clips in the right upper quadrant. Mild scoliosis of the spine. Vertebral body heights are maintained. Moderate degenerative changes at L3-L4 and L4-L5 with mild degenerative change at L5-S1. Calcified distal aortic aneurysm measuring 5.8 cm AP. Additional calcifications in the left upper quadrant, likely vascular. IMPRESSION: 1. Mild scoliosis of the spine with multiple level degenerative change. No acute osseous abnormality. 2. 5.8 cm abdominal aortic aneurysm, appears increased since CT from 07/29/2015 at  which time aneurysm measured approximately 4.2 cm. CTA follow-up is recommended. Electronically  Signed   By: Jasmine Pang M.D.   On: 02/19/2018 14:41   [place] Aaa Duplex Limited  Result Date: 02/19/2018 CLINICAL DATA:  Known abdominal aortic aneurysm with possible enlargement by lumbar spine radiograph today. EXAM: ULTRASOUND OF ABDOMINAL AORTA TECHNIQUE: Ultrasound examination of the abdominal aorta was performed to evaluate for abdominal aortic aneurysm. COMPARISON:  Lumbar spine x-rays earlier today as well as ultrasound of the aorta on 08/25/2017 and CT of the abdomen and pelvis without contrast on 07/29/2015. FINDINGS: Abdominal aortic measurements as follows: Proximal:  2.7 x 2.0 cm cm Mid:  2.6 x 1.9 cm cm Distal:  5.1 x 5.5 cm cm Visualized proximal common iliac artery origins appear normal in caliber. Fusiform aneurysmal disease of the distal abdominal aorta does appear to be slightly larger by ultrasound over the last 6 months. The most accurate representation of aneurysmal disease would be by CTA of the abdomen and pelvis with contrast. IMPRESSION: Probable enlargement of abdominal aortic aneurysm since prior ultrasound 6 months ago with maximal diameter now estimated to be just over 5 cm by ultrasound. Given maximal diameter of greater than 5 cm as well as evidence of interval growth, CTA of the abdomen and pelvis with contrast is indicated as well as referral to vascular surgery. Electronically Signed   By: Irish Lack M.D.   On: 02/19/2018 16:18   Ct Angio Chest Aorta W/cm &/or Wo/cm  Result Date: 02/19/2018 CLINICAL DATA:  Abdominal aortic aneurysm. Back pain and hypertension. Ultrasound demonstrating possible increased size. EXAM: CT ANGIOGRAPHY CHEST, ABDOMEN AND PELVIS TECHNIQUE: Multidetector CT imaging through the chest, abdomen and pelvis was performed using the standard protocol during bolus administration of intravenous contrast. Precontrast images were also performed. Multiplanar reconstructed images and MIPs were obtained and reviewed to evaluate the vascular anatomy.  CONTRAST:  75mL ISOVUE-370 IOPAMIDOL (ISOVUE-370) INJECTION 76% COMPARISON:  Ultrasound of earlier in the day. Most recent CT abdomen pelvis 07/29/2015. Chest CT 07/03/2015 reviewed. FINDINGS: CTA CHEST FINDINGS Cardiovascular: No intramural hematoma. Advanced aortic and branch vessel atherosclerosis. Extensive ulcerative plaque in the descending aorta. No aortic aneurysm or dissection. Normal heart size, without pericardial effusion. Multivessel coronary artery atherosclerosis. No central pulmonary embolism, on this non-dedicated study. Mediastinum/Nodes: No mediastinal or hilar adenopathy. Lungs/Pleura: No pleural fluid. Mild centrilobular emphysema. Right lower lobe 7 mm ground-glass nodule is similar to minimally enlarged from 6 mm on the prior exam. Example image 93/14 today. A superior segment left lower lobe well-circumscribed pulmonary nodule measures 7 mm on image 49/14 and is similar to 07/03/2015, presumably benign. Other soft tissue density pulmonary nodules are on the order of 5-6 mm and less are identified on series 14. These are felt to be similar. Musculoskeletal: No acute osseous abnormality. Review of the MIP images confirms the above findings. CTA ABDOMEN AND PELVIS FINDINGS VASCULAR Aorta: Advanced abdominal aortic and branch vessel atherosclerosis. The infrarenal aorta measures maximally 4.8 x 4.6 cm on image 128/12. Compare 4.0 x 4.2 cm on 07/29/2015. Extensive wall thrombus within. No periaortic hemorrhage or evidence of dissection. Celiac: Widely patent. SMA: Atherosclerotic without significant stenosis. Renals: Suspect mild left renal artery narrowing secondary to atherosclerosis. Single renal arteries bilaterally. IMA: Not confidently visualized proximally. Suspect chronically occluded. Inflow: No iliac artery aneurysm or significant stenosis. Veins: Patent where opacified. Review of the MIP images confirms the above findings. NON-VASCULAR Hepatobiliary: Normal liver. Cholecystectomy,  without biliary ductal dilatation. Pancreas: Normal, without mass  or ductal dilatation. Spleen: Hypoattenuating lesion within the central spleen is of doubtful clinical significance. Adrenals/Urinary Tract: Normal adrenal glands. No renal calculi or hydronephrosis. No bladder stone. No renal mass on post-contrast imaging. No enhancing bladder mass. Stomach/Bowel: The stomach is underdistended. Greater curvature appears thick walled proximally including at 3.2 cm. Periampullary duodenal diverticulum. Scattered colonic diverticula. Otherwise normal small bowel. Lymphatic: No abdominopelvic adenopathy. Reproductive: Hysterectomy.  No adnexal mass. Other: No significant free fluid. Mild pelvic floor laxity. No free intraperitoneal air. Musculoskeletal: Lumbosacral spondylosis. Review of the MIP images confirms the above findings. IMPRESSION: 1. Since 07/29/2015, increase in infrarenal abdominal aortic aneurysm, without surrounding hemorrhage or other acute complication. 2. Bilateral pulmonary nodules are primarily similar to 2017. There is a ground-glass nodule which may have enlarged minimally since the prior. Initial follow-up with CT at 6-12 months is recommended to confirm persistence. If persistent, repeat CT is recommended every 2 years until 5 years of stability has been established. This recommendation follows the consensus statement: Guidelines for Management of Incidental Pulmonary Nodules Detected on CT Images: From the Fleischner Society 2017; Radiology 2017; 284:228-243. 3. Aortic atherosclerosis (ICD10-I70.0), coronary artery atherosclerosis and emphysema (ICD10-J43.9). 4. Apparent greater curvature gastric wall thickening is at least partially felt to be due to underdistention. Correlate with any symptoms of gastritis. Electronically Signed   By: Jeronimo Greaves M.D.   On: 02/19/2018 18:35   Ct Angio Abd/pel W And/or Wo Contrast  Result Date: 02/19/2018 CLINICAL DATA:  Abdominal aortic aneurysm. Back  pain and hypertension. Ultrasound demonstrating possible increased size. EXAM: CT ANGIOGRAPHY CHEST, ABDOMEN AND PELVIS TECHNIQUE: Multidetector CT imaging through the chest, abdomen and pelvis was performed using the standard protocol during bolus administration of intravenous contrast. Precontrast images were also performed. Multiplanar reconstructed images and MIPs were obtained and reviewed to evaluate the vascular anatomy. CONTRAST:  75mL ISOVUE-370 IOPAMIDOL (ISOVUE-370) INJECTION 76% COMPARISON:  Ultrasound of earlier in the day. Most recent CT abdomen pelvis 07/29/2015. Chest CT 07/03/2015 reviewed. FINDINGS: CTA CHEST FINDINGS Cardiovascular: No intramural hematoma. Advanced aortic and branch vessel atherosclerosis. Extensive ulcerative plaque in the descending aorta. No aortic aneurysm or dissection. Normal heart size, without pericardial effusion. Multivessel coronary artery atherosclerosis. No central pulmonary embolism, on this non-dedicated study. Mediastinum/Nodes: No mediastinal or hilar adenopathy. Lungs/Pleura: No pleural fluid. Mild centrilobular emphysema. Right lower lobe 7 mm ground-glass nodule is similar to minimally enlarged from 6 mm on the prior exam. Example image 93/14 today. A superior segment left lower lobe well-circumscribed pulmonary nodule measures 7 mm on image 49/14 and is similar to 07/03/2015, presumably benign. Other soft tissue density pulmonary nodules are on the order of 5-6 mm and less are identified on series 14. These are felt to be similar. Musculoskeletal: No acute osseous abnormality. Review of the MIP images confirms the above findings. CTA ABDOMEN AND PELVIS FINDINGS VASCULAR Aorta: Advanced abdominal aortic and branch vessel atherosclerosis. The infrarenal aorta measures maximally 4.8 x 4.6 cm on image 128/12. Compare 4.0 x 4.2 cm on 07/29/2015. Extensive wall thrombus within. No periaortic hemorrhage or evidence of dissection. Celiac: Widely patent. SMA:  Atherosclerotic without significant stenosis. Renals: Suspect mild left renal artery narrowing secondary to atherosclerosis. Single renal arteries bilaterally. IMA: Not confidently visualized proximally. Suspect chronically occluded. Inflow: No iliac artery aneurysm or significant stenosis. Veins: Patent where opacified. Review of the MIP images confirms the above findings. NON-VASCULAR Hepatobiliary: Normal liver. Cholecystectomy, without biliary ductal dilatation. Pancreas: Normal, without mass or ductal dilatation. Spleen: Hypoattenuating lesion within the  central spleen is of doubtful clinical significance. Adrenals/Urinary Tract: Normal adrenal glands. No renal calculi or hydronephrosis. No bladder stone. No renal mass on post-contrast imaging. No enhancing bladder mass. Stomach/Bowel: The stomach is underdistended. Greater curvature appears thick walled proximally including at 3.2 cm. Periampullary duodenal diverticulum. Scattered colonic diverticula. Otherwise normal small bowel. Lymphatic: No abdominopelvic adenopathy. Reproductive: Hysterectomy.  No adnexal mass. Other: No significant free fluid. Mild pelvic floor laxity. No free intraperitoneal air. Musculoskeletal: Lumbosacral spondylosis. Review of the MIP images confirms the above findings. IMPRESSION: 1. Since 07/29/2015, increase in infrarenal abdominal aortic aneurysm, without surrounding hemorrhage or other acute complication. 2. Bilateral pulmonary nodules are primarily similar to 2017. There is a ground-glass nodule which may have enlarged minimally since the prior. Initial follow-up with CT at 6-12 months is recommended to confirm persistence. If persistent, repeat CT is recommended every 2 years until 5 years of stability has been established. This recommendation follows the consensus statement: Guidelines for Management of Incidental Pulmonary Nodules Detected on CT Images: From the Fleischner Society 2017; Radiology 2017; 284:228-243. 3.  Aortic atherosclerosis (ICD10-I70.0), coronary artery atherosclerosis and emphysema (ICD10-J43.9). 4. Apparent greater curvature gastric wall thickening is at least partially felt to be due to underdistention. Correlate with any symptoms of gastritis. Electronically Signed   By: Jeronimo Greaves M.D.   On: 02/19/2018 18:35    Scheduled Meds: . carvedilol  12.5 mg Oral BID WC  . clonazePAM  0.5 mg Oral QHS  . enoxaparin (LOVENOX) injection  40 mg Subcutaneous Q24H  . ferrous sulfate  325 mg Oral Q breakfast  . gabapentin  600 mg Oral BID  . gemfibrozil  600 mg Oral BID AC  . levothyroxine  100 mcg Oral Q0600  . lisinopril  20 mg Oral Daily  . multivitamin with minerals  1 tablet Oral Daily  . pantoprazole  40 mg Oral Daily  . pravastatin  40 mg Oral QHS  . senna  1 tablet Oral BID  . sodium chloride flush  3 mL Intravenous Q12H   Continuous Infusions:  Assessment/Plan:  1. Symptomatic abdominal aortic aneurysm.  Dr. Gilda Crease to do a repair on Wednesday.  No contraindications to surgery at this time 2. Back pain.  Unclear if it secondary to musculoskeletal pains or secondary to the abdominal aortic aneurysm.  I offered her a dose of steroid but she would rather hold off on that right now. 3. Hypertension on Coreg and lisinopril 4. Hypothyroidism unspecified on levothyroxine 5. Hyperlipidemia unspecified on pravastatin 6. GERD on Protonix  Code Status:     Code Status Orders  (From admission, onward)         Start     Ordered   02/19/18 2113  Do not attempt resuscitation (DNR)  Continuous    Question Answer Comment  In the event of cardiac or respiratory ARREST Do not call a "code blue"   In the event of cardiac or respiratory ARREST Do not perform Intubation, CPR, defibrillation or ACLS   In the event of cardiac or respiratory ARREST Use medication by any route, position, wound care, and other measures to relive pain and suffering. May use oxygen, suction and manual treatment of  airway obstruction as needed for comfort.      02/19/18 2112        Code Status History    Date Active Date Inactive Code Status Order ID Comments User Context   07/29/2015 1141 08/01/2015 1401 Full Code [id number]  Katha Hamming, MD ED  Advance Directive Documentation     Most Recent Value  Type of Advance Directive  Out of facility DNR (pink MOST or yellow form)  Pre-existing out of facility DNR order (yellow form or pink MOST form)  Physician notified to receive inpatient order  "MOST" Form in Place?  -     Disposition Plan: Depending on the type of procedure Dr. Gilda Crease does will depend on when she can go home  Consultants:  Vascular surgery  Time spent: 25 minutes  Adonai Selsor [employer]

## 2018-02-20 NOTE — Plan of Care (Signed)
  Problem: Education: Goal: Knowledge of General Education information will improve Description Including pain rating scale, medication(s)/side effects and non-pharmacologic comfort measures Outcome: Progressing   Problem: Health Behavior/Discharge Planning: Goal: Ability to manage health-related needs will improve Outcome: Progressing   Problem: Clinical Measurements: Goal: Ability to maintain clinical measurements within normal limits will improve Outcome: Progressing Goal: Will remain free from infection Outcome: Progressing Goal: Diagnostic test results will improve Outcome: Progressing Goal: Respiratory complications will improve Outcome: Progressing Goal: Cardiovascular complication will be avoided Outcome: Progressing   Problem: Activity: Goal: Risk for activity intolerance will decrease Outcome: Progressing   Problem: Coping: Goal: Level of anxiety will decrease Outcome: Progressing   Problem: Pain Managment: Goal: General experience of comfort will improve Outcome: Progressing   Problem: Education: Goal: Knowledge of discharge needs will improve Outcome: Progressing   Problem: Clinical Measurements: Goal: Postoperative complications will be avoided or minimized Outcome: Progressing

## 2018-02-21 ENCOUNTER — Encounter
Admission: EM | Disposition: A | Discharge status: Home / Self Care | Payer: Self-pay | Source: Home / Self Care | Attending: Internal Medicine

## 2018-02-21 ENCOUNTER — Inpatient Hospital Stay: Payer: Medicare HMO | Admitting: Certified Registered"

## 2018-02-21 DIAGNOSIS — I169 Hypertensive crisis, unspecified: Secondary | ICD-10-CM

## 2018-02-21 DIAGNOSIS — I714 Abdominal aortic aneurysm, without rupture: Secondary | ICD-10-CM

## 2018-02-21 DIAGNOSIS — I10 Essential (primary) hypertension: Secondary | ICD-10-CM

## 2018-02-21 HISTORY — PX: ENDOVASCULAR REPAIR/STENT GRAFT: CATH118280

## 2018-02-21 LAB — BASIC METABOLIC PANEL
Anion gap: 8 (ref 5–15)
BUN: 30 mg/dL — ABNORMAL HIGH (ref 8–23)
CO2: 28 mmol/L (ref 22–32)
Calcium: 8.8 mg/dL — ABNORMAL LOW (ref 8.9–10.3)
Chloride: 106 mmol/L (ref 98–111)
Creatinine, Ser: 1.22 mg/dL — ABNORMAL HIGH (ref 0.44–1.00)
GFR calc Af Amer: 48 mL/min — ABNORMAL LOW (ref >60)
GFR calc non Af Amer: 41 mL/min — ABNORMAL LOW (ref >60)
Glucose, Bld: 94 mg/dL (ref 70–99)
Potassium: 3.5 mmol/L (ref 3.5–5.1)
Sodium: 142 mmol/L (ref 135–145)

## 2018-02-21 LAB — PROTIME-INR
INR: 1.11
Prothrombin Time: 14.2 seconds (ref 11.4–15.2)

## 2018-02-21 LAB — CBC
HCT: 34.6 % — ABNORMAL LOW (ref 36.0–46.0)
Hemoglobin: 10.9 g/dL — ABNORMAL LOW (ref 12.0–15.0)
MCH: 30.2 pg (ref 26.0–34.0)
MCHC: 31.5 g/dL (ref 30.0–36.0)
MCV: 95.8 fL (ref 80.0–100.0)
Platelets: 264 10*3/uL (ref 150–400)
RBC: 3.61 MIL/uL — ABNORMAL LOW (ref 3.87–5.11)
RDW: 13.4 % (ref 11.5–15.5)
WBC: 5.8 10*3/uL (ref 4.0–10.5)
nRBC: 0 % (ref 0.0–0.2)

## 2018-02-21 LAB — TYPE AND SCREEN
ABO/RH(D): A POS
Antibody Screen: NEGATIVE

## 2018-02-21 LAB — APTT: aPTT: 35 seconds (ref 24–36)

## 2018-02-21 LAB — MAGNESIUM: Magnesium: 1.5 mg/dL — ABNORMAL LOW (ref 1.7–2.4)

## 2018-02-21 LAB — GLUCOSE, CAPILLARY: Glucose-Capillary: 147 mg/dL — ABNORMAL HIGH (ref 70–99)

## 2018-02-21 SURGERY — ENDOVASCULAR REPAIR/STENT GRAFT
Anesthesia: General

## 2018-02-21 MED ORDER — CEFAZOLIN SODIUM-DEXTROSE 2-4 GM/100ML-% IV SOLN
2.0000 g | Freq: Three times a day (TID) | INTRAVENOUS | Status: AC
  Administered 2018-02-21 – 2018-02-22 (×2): 2 g via INTRAVENOUS
  Filled 2018-02-21 (×2): qty 100

## 2018-02-21 MED ORDER — PROPOFOL 10 MG/ML IV BOLUS
INTRAVENOUS | Status: DC | PRN
  Administered 2018-02-21: 100 mg via INTRAVENOUS

## 2018-02-21 MED ORDER — FAMOTIDINE 20 MG PO TABS
ORAL_TABLET | ORAL | Status: AC
  Filled 2018-02-21: qty 1

## 2018-02-21 MED ORDER — MEPERIDINE HCL 25 MG/ML IJ SOLN
6.2500 mg | INTRAMUSCULAR | Status: DC | PRN
  Filled 2018-02-21: qty 1

## 2018-02-21 MED ORDER — FENTANYL CITRATE (PF) 100 MCG/2ML IJ SOLN
INTRAMUSCULAR | Status: AC
  Filled 2018-02-21: qty 2

## 2018-02-21 MED ORDER — SODIUM CHLORIDE 0.9 % IV SOLN
INTRAVENOUS | Status: DC
  Administered 2018-02-21 – 2018-02-22 (×2): via INTRAVENOUS

## 2018-02-21 MED ORDER — HYDRALAZINE HCL 20 MG/ML IJ SOLN: 5.0000 mg | INTRAMUSCULAR | Status: DC | PRN

## 2018-02-21 MED ORDER — METHYLPREDNISOLONE SODIUM SUCC 125 MG IJ SOLR
125.0000 mg | INTRAMUSCULAR | Status: AC
  Administered 2018-02-21: 125 mg via INTRAVENOUS

## 2018-02-21 MED ORDER — HEPARIN SODIUM (PORCINE) 1000 UNIT/ML IJ SOLN
INTRAMUSCULAR | Status: AC
  Filled 2018-02-21: qty 1

## 2018-02-21 MED ORDER — HEPARIN SODIUM (PORCINE) 1000 UNIT/ML IJ SOLN
INTRAMUSCULAR | Status: DC | PRN
  Administered 2018-02-21: 6000 [IU] via INTRAVENOUS

## 2018-02-21 MED ORDER — ROCURONIUM BROMIDE 100 MG/10ML IV SOLN
INTRAVENOUS | Status: DC | PRN
  Administered 2018-02-21: 40 mg via INTRAVENOUS
  Administered 2018-02-21: 10 mg via INTRAVENOUS

## 2018-02-21 MED ORDER — ONDANSETRON HCL 4 MG/2ML IJ SOLN
INTRAMUSCULAR | Status: DC | PRN
  Administered 2018-02-21: 4 mg via INTRAVENOUS

## 2018-02-21 MED ORDER — HEPARIN (PORCINE) IN NACL 1000-0.9 UT/500ML-% IV SOLN
INTRAVENOUS | Status: AC
  Filled 2018-02-21: qty 1500

## 2018-02-21 MED ORDER — LIDOCAINE HCL (PF) 2 % IJ SOLN
INTRAMUSCULAR | Status: AC
  Filled 2018-02-21: qty 10

## 2018-02-21 MED ORDER — METHYLPREDNISOLONE SODIUM SUCC 125 MG IJ SOLR
INTRAMUSCULAR | Status: AC
  Administered 2018-02-21: 125 mg via INTRAVENOUS
  Filled 2018-02-21: qty 2

## 2018-02-21 MED ORDER — METOPROLOL TARTRATE 5 MG/5ML IV SOLN
INTRAVENOUS | Status: AC
  Administered 2018-02-21: 5 mg via INTRAVENOUS
  Filled 2018-02-21: qty 5

## 2018-02-21 MED ORDER — FAMOTIDINE 20 MG PO TABS: 40.0000 mg | ORAL_TABLET | Freq: Once | ORAL | Status: DC

## 2018-02-21 MED ORDER — SUGAMMADEX SODIUM 200 MG/2ML IV SOLN
INTRAVENOUS | Status: DC | PRN
  Administered 2018-02-21: 150 mg via INTRAVENOUS

## 2018-02-21 MED ORDER — FENTANYL CITRATE (PF) 100 MCG/2ML IJ SOLN
25.0000 ug | INTRAMUSCULAR | Status: DC | PRN
  Administered 2018-02-21 (×2): 25 ug via INTRAVENOUS

## 2018-02-21 MED ORDER — SODIUM CHLORIDE FLUSH 0.9 % IV SOLN
INTRAVENOUS | Status: AC
  Filled 2018-02-21: qty 10

## 2018-02-21 MED ORDER — PHENOL 1.4 % MT LIQD
1.0000 | OROMUCOSAL | Status: DC | PRN
  Filled 2018-02-21: qty 177

## 2018-02-21 MED ORDER — SUGAMMADEX SODIUM 200 MG/2ML IV SOLN
INTRAVENOUS | Status: AC
  Filled 2018-02-21: qty 2

## 2018-02-21 MED ORDER — MORPHINE SULFATE (PF) 4 MG/ML IV SOLN: 4.0000 mg | INTRAVENOUS | Status: DC | PRN

## 2018-02-21 MED ORDER — PROPOFOL 10 MG/ML IV BOLUS
INTRAVENOUS | Status: AC
  Filled 2018-02-21: qty 20

## 2018-02-21 MED ORDER — ROCURONIUM BROMIDE 50 MG/5ML IV SOLN
INTRAVENOUS | Status: AC
  Filled 2018-02-21: qty 1

## 2018-02-21 MED ORDER — FAMOTIDINE IN NACL 20-0.9 MG/50ML-% IV SOLN: 20.0000 mg | Freq: Two times a day (BID) | INTRAVENOUS | Status: DC

## 2018-02-21 MED ORDER — SODIUM CHLORIDE 0.9 % IV SOLN: 500.0000 mL | Freq: Once | INTRAVENOUS | Status: DC | PRN

## 2018-02-21 MED ORDER — FENTANYL CITRATE (PF) 100 MCG/2ML IJ SOLN
INTRAMUSCULAR | Status: AC
  Administered 2018-02-21: 25 ug via INTRAVENOUS
  Filled 2018-02-21: qty 2

## 2018-02-21 MED ORDER — DIPHENHYDRAMINE HCL 50 MG/ML IJ SOLN
INTRAMUSCULAR | Status: AC
  Administered 2018-02-21: 50 mg via INTRAVENOUS
  Filled 2018-02-21: qty 1

## 2018-02-21 MED ORDER — MAGNESIUM SULFATE 2 GM/50ML IV SOLN: 2.0000 g | Freq: Every day | INTRAVENOUS | Status: DC | PRN

## 2018-02-21 MED ORDER — PROMETHAZINE HCL 25 MG/ML IJ SOLN
INTRAMUSCULAR | Status: AC
  Administered 2018-02-21: 12.5 mg via INTRAVENOUS
  Filled 2018-02-21: qty 1

## 2018-02-21 MED ORDER — METOPROLOL TARTRATE 5 MG/5ML IV SOLN
5.0000 mg | Freq: Once | INTRAVENOUS | Status: AC
  Administered 2018-02-21: 5 mg via INTRAVENOUS

## 2018-02-21 MED ORDER — OXYCODONE HCL 5 MG/5ML PO SOLN
5.0000 mg | Freq: Once | ORAL | Status: DC | PRN
  Filled 2018-02-21: qty 5

## 2018-02-21 MED ORDER — NITROGLYCERIN IN D5W 200-5 MCG/ML-% IV SOLN: 5.0000 ug/min | INTRAVENOUS | Status: DC

## 2018-02-21 MED ORDER — DIPHENHYDRAMINE HCL 50 MG/ML IJ SOLN
50.0000 mg | INTRAMUSCULAR | Status: AC
  Administered 2018-02-21: 50 mg via INTRAVENOUS

## 2018-02-21 MED ORDER — OXYCODONE HCL 5 MG PO TABS: 5.0000 mg | ORAL_TABLET | Freq: Once | ORAL | Status: DC | PRN

## 2018-02-21 MED ORDER — DOCUSATE SODIUM 100 MG PO CAPS
100.0000 mg | ORAL_CAPSULE | Freq: Every day | ORAL | Status: DC
  Filled 2018-02-21: qty 1

## 2018-02-21 MED ORDER — FENTANYL CITRATE (PF) 100 MCG/2ML IJ SOLN
INTRAMUSCULAR | Status: DC | PRN
  Administered 2018-02-21 (×3): 50 ug via INTRAVENOUS

## 2018-02-21 MED ORDER — GUAIFENESIN-DM 100-10 MG/5ML PO SYRP
15.0000 mL | ORAL_SOLUTION | ORAL | Status: DC | PRN
  Filled 2018-02-21: qty 15

## 2018-02-21 MED ORDER — LABETALOL HCL 5 MG/ML IV SOLN: 10.0000 mg | INTRAVENOUS | Status: DC | PRN

## 2018-02-21 MED ORDER — FAMOTIDINE IN NACL 20-0.9 MG/50ML-% IV SOLN
20.0000 mg | Freq: Once | INTRAVENOUS | Status: AC
  Administered 2018-02-21: 20 mg via INTRAVENOUS
  Filled 2018-02-21: qty 50

## 2018-02-21 MED ORDER — DOPAMINE-DEXTROSE 3.2-5 MG/ML-% IV SOLN: 3.0000 ug/kg/min | INTRAVENOUS | Status: DC

## 2018-02-21 MED ORDER — METOPROLOL TARTRATE 5 MG/5ML IV SOLN: 2.0000 mg | INTRAVENOUS | Status: DC | PRN

## 2018-02-21 MED ORDER — ONDANSETRON HCL 4 MG/2ML IJ SOLN
INTRAMUSCULAR | Status: AC
  Filled 2018-02-21: qty 2

## 2018-02-21 MED ORDER — FAMOTIDINE 20 MG PO TABS
20.0000 mg | ORAL_TABLET | Freq: Two times a day (BID) | ORAL | Status: DC
  Administered 2018-02-21 – 2018-02-22 (×2): 20 mg via ORAL
  Filled 2018-02-21 (×3): qty 1

## 2018-02-21 MED ORDER — ALUM & MAG HYDROXIDE-SIMETH 200-200-20 MG/5ML PO SUSP
15.0000 mL | ORAL | Status: DC | PRN
  Filled 2018-02-21: qty 30

## 2018-02-21 MED ORDER — MORPHINE SULFATE (PF) 2 MG/ML IV SOLN: 2.0000 mg | INTRAVENOUS | Status: DC | PRN

## 2018-02-21 MED ORDER — ACETAMINOPHEN 325 MG PO TABS: 325.0000 mg | ORAL_TABLET | ORAL | Status: DC | PRN

## 2018-02-21 MED ORDER — POTASSIUM CHLORIDE CRYS ER 20 MEQ PO TBCR
20.0000 meq | EXTENDED_RELEASE_TABLET | Freq: Every day | ORAL | Status: DC | PRN
  Filled 2018-02-21: qty 2

## 2018-02-21 MED ORDER — ACETAMINOPHEN 650 MG RE SUPP: 325.0000 mg | RECTAL | Status: DC | PRN

## 2018-02-21 MED ORDER — PROMETHAZINE HCL 25 MG/ML IJ SOLN
6.2500 mg | INTRAMUSCULAR | Status: DC | PRN
  Administered 2018-02-21: 12.5 mg via INTRAVENOUS

## 2018-02-21 MED ORDER — LIDOCAINE HCL (CARDIAC) PF 100 MG/5ML IV SOSY
PREFILLED_SYRINGE | INTRAVENOUS | Status: DC | PRN
  Administered 2018-02-21: 100 mg via INTRAVENOUS

## 2018-02-21 MED ORDER — OXYCODONE-ACETAMINOPHEN 5-325 MG PO TABS: 1.0000 | ORAL_TABLET | ORAL | Status: DC | PRN

## 2018-02-21 MED ORDER — ONDANSETRON HCL 4 MG/2ML IJ SOLN: 4.0000 mg | Freq: Four times a day (QID) | INTRAMUSCULAR | Status: DC | PRN

## 2018-02-21 SURGICAL SUPPLY — 52 items
BALLN MUSTANG 10X60X75 (BALLOONS) ×4
BALLOON MUSTANG 10X60X75 (BALLOONS) ×2 IMPLANT
CATH ACCU-VU SIZ PIG 5F 70CM (CATHETERS) ×2 IMPLANT
CATH BALLN CODA 9X100X32 (BALLOONS) ×2 IMPLANT
CATH BEACON 5 .035 65 KMP TIP (CATHETERS) ×2 IMPLANT
DEVICE CLOSURE PERCLS PRGLD 6F (VASCULAR PRODUCTS) ×5 IMPLANT
DEVICE PRESTO INFLATION (MISCELLANEOUS) ×4 IMPLANT
DEVICE SAFEGUARD 24CM (GAUZE/BANDAGES/DRESSINGS) ×4 IMPLANT
DEVICE TORQUE .025-.038 (MISCELLANEOUS) ×2 IMPLANT
DRESSING SURGICEL FIBRLLR 1X2 (HEMOSTASIS) ×1 IMPLANT
DRSG SURGICEL FIBRILLAR 1X2 (HEMOSTASIS) ×2
DRYSEAL FLEXSHEATH 12FR 33CM (SHEATH) ×1
DRYSEAL FLEXSHEATH 18FR 33CM (SHEATH) ×1
EXCLUDER TNK LEG 31MX14X15 (Endovascular Graft) ×1 IMPLANT
EXCLUDER TRUNK LEG 31MX14X15 (Endovascular Graft) ×2 IMPLANT
GLIDEWIRE STIFF .35X180X3 HYDR (WIRE) ×2 IMPLANT
GLOVE BIO SURGEON STRL SZ7 (GLOVE) ×2 IMPLANT
GLOVE SURG SYN 8.0 (GLOVE) ×2 IMPLANT
GOWN STRL REUS W/ TWL LRG LVL3 (GOWN DISPOSABLE) ×2 IMPLANT
GOWN STRL REUS W/ TWL XL LVL3 (GOWN DISPOSABLE) ×2 IMPLANT
GOWN STRL REUS W/TWL LRG LVL3 (GOWN DISPOSABLE) ×2
GOWN STRL REUS W/TWL XL LVL3 (GOWN DISPOSABLE) ×2
LEG CONTRALATERAL 16X12X10 (Vascular Products) ×1 IMPLANT
NEEDLE ENTRY 21GA 7CM ECHOTIP (NEEDLE) ×2 IMPLANT
PACK ANGIOGRAPHY (CUSTOM PROCEDURE TRAY) ×2 IMPLANT
PERCLOSE PROGLIDE 6F (VASCULAR PRODUCTS) ×10
SET INTRO CAPELLA COAXIAL (SET/KITS/TRAYS/PACK) ×2 IMPLANT
SHEATH BRITE TIP 6FRX11 (SHEATH) ×4 IMPLANT
SHEATH BRITE TIP 8FRX11 (SHEATH) ×4 IMPLANT
SHEATH DRYSEAL FLEX 12FR 33CM (SHEATH) ×1 IMPLANT
SHEATH DRYSEAL FLEX 18FR 33CM (SHEATH) ×1 IMPLANT
STENT GRAFT CONTRALAT 16X12X10 (Vascular Products) ×1 IMPLANT
SUT MNCRL 4-0 (SUTURE) ×4
SUT MNCRL 4-0 27XMFL (SUTURE) ×4
SUT MNCRL AB 4-0 PS2 18 (SUTURE) ×2 IMPLANT
SUT MNCRL+ 5-0 UNDYED PC-3 (SUTURE) ×2 IMPLANT
SUT MONOCRYL 5-0 (SUTURE) ×2
SUT PROLENE 5 0 RB 1 DA (SUTURE) ×8 IMPLANT
SUT PROLENE 6 0 BV (SUTURE) ×20 IMPLANT
SUT SILK 2 0 (SUTURE) ×1
SUT SILK 2-0 18XBRD TIE 12 (SUTURE) ×1 IMPLANT
SUT SILK 3 0 (SUTURE) ×1
SUT SILK 3-0 18XBRD TIE 12 (SUTURE) ×1 IMPLANT
SUT SILK 4 0 (SUTURE) ×1
SUT SILK 4-0 18XBRD TIE 12 (SUTURE) ×1 IMPLANT
SUT VIC AB 2-0 CT1 (SUTURE) ×12 IMPLANT
SUT VICRYL+ 3-0 36IN CT-1 (SUTURE) ×12 IMPLANT
SUTURE MNCRL 4-0 27XMF (SUTURE) ×4 IMPLANT
SYR MEDRAD MARK V 150ML (SYRINGE) ×2 IMPLANT
TUBING CONTRAST HIGH PRESS 72 (TUBING) ×2 IMPLANT
WIRE AMPLATZ SSTIFF .035X260CM (WIRE) ×4 IMPLANT
WIRE J 3MM .035X145CM (WIRE) ×4 IMPLANT

## 2018-02-21 NOTE — Op Note (Signed)
OPERATIVE NOTE   PROCEDURE: 1. US guidance for vascular access, bilateral femoral arteries 2. Catheter placement into aorta from bilateral femoral approaches 3. Placement of a 31 x 14 x 15 C3 Gore Excluder Endoprosthesis main body  with a 12 x 10 contralateral limb 4. ProGlide closure devices bilateral femoral arteries  PRE-OPERATIVE DIAGNOSIS: Symptomatic AAA; malignant hypertension  POST-OPERATIVE DIAGNOSIS: same  SURGEON: Levora Dredge, MD and Festus Barren, MD - Co-surgeons  ANESTHESIA: general  ESTIMATED BLOOD LOSS: 50 cc  FINDING(S): 1.  AAA  SPECIMEN(S):  none  INDICATIONS:   Eileen Mack is a 82 y.o. y.o. female who presents with hypertensive crisis and abdominal pain.  By palpation her aneurysm was very tender.  It also increased in diameter significantly compared with the last scan.  Based on these findings it was elected to repair her aneurysm to prevent lethal rupture.  She is an endovascular candidate and therefore will undergo stent graft repair.  Risks and benefits of been reviewed all questions answered patient agrees to proceed..  DESCRIPTION: After obtaining full informed written consent, the patient was brought back to the operating room and placed supine upon the operating table.  The patient received IV antibiotics prior to induction.  After obtaining adequate anesthesia, the patient was prepped and draped in the standard fashion for endovascular AAA repair.  Co-surgeons are required because this is a complex bilateral procedure with work being performed simultaneously from both the right femoral and left femoral approach.  This also expedites the procedure making a shorter operative time reducing complications and improving patient safety.  We then began by gaining access to both femoral arteries with US guidance with me working on the patient's right and Dr. Wyn Quaker working on the patient's left.  The femoral arteries were found to be patent and accessed  without difficulty with a needle under ultrasound guidance without difficulty on each side and permanent images were recorded.  We then placed 2 proglide devices on each side in a pre-close fashion and placed 8 French sheaths.  The patient was then given 6000 units of intravenous heparin.   The Pigtail catheter was placed into the aorta from the left side. Using this image, we selected a 31 x 14 x 15 Main body device.  Over a stiff wire, an 67 French sheath was placed. The main body was then placed through the 18 French sheath. A Kumpe catheter was placed up the left side and a magnified image at the renal arteries was performed. The main body was then deployed just below the lowest renal artery. The Kumpe catheter was used to cannulate the contralateral gate without difficulty and successful cannulation was confirmed by twirling the pigtail catheter in the main body. We then placed a stiff wire and a retrograde arteriogram was performed through the left femoral sheath. We upsized to the 12 [place] sheath for the contralateral limb and a 12 x 10 limb was selected and deployed. The main body deployment was then completed. Based off the angiographic findings, extension limbs were not necessary.  All junction points and seals zones were treated with the compliant balloon.   The pigtail catheter was then replaced and a completion angiogram was performed.   No endoleak was detected on completion angiography. The renal arteries were found to be widely patent.   At this point we elected to terminate the procedure. We secured the pro glide devices for hemostasis on the femoral arteries. The skin incision was closed with a 4-0  Monocryl. Dermabond and pressure dressing were placed. The patient was taken to the recovery room in stable condition having tolerated the procedure well.  COMPLICATIONS: none  CONDITION: stable  Levora Dredge  02/21/2018, 11:56 AM

## 2018-02-21 NOTE — Transfer of Care (Signed)
Immediate Anesthesia Transfer of Care Note  Patient: Eileen Mack  Procedure(s) Performed: ENDOVASCULAR REPAIR/STENT GRAFT (N/A )  Patient Location: PACU  Anesthesia Type:General  Level of Consciousness: drowsy and responds to stimulation  Airway & Oxygen Therapy: Patient Spontanous Breathing and Patient connected to face mask oxygen  Post-op Assessment: Report given to RN and Post -op Vital signs reviewed and stable  Post vital signs: Reviewed and stable  Last Vitals:  Vitals Value Taken Time  BP 173/80 02/21/2018 12:13 PM  Temp    Pulse 112 02/21/2018 12:13 PM  Resp 21 02/21/2018 12:13 PM  SpO2 93 % 02/21/2018 12:13 PM  Vitals shown include unvalidated device data.  Last Pain:  Vitals:   02/21/18 0933  TempSrc:   PainSc: 1       Patients Stated Pain Goal: 0 (02/19/18 2112)  Complications: No apparent anesthesia complications

## 2018-02-21 NOTE — Progress Notes (Signed)
Dr. Henrene Hawking at bedside reviewed EKG.  Stated OK to take pt to ICU.

## 2018-02-21 NOTE — Anesthesia Preprocedure Evaluation (Signed)
Anesthesia Evaluation  Patient identified by MRN, date of birth, ID band Patient awake    Reviewed: Allergy & Precautions, NPO status , Patient's Chart, lab work & pertinent test results  History of Anesthesia Complications Negative for: history of anesthetic complications  Airway Mallampati: II  TM Distance: >3 FB Neck ROM: Full    Dental  (+) Edentulous Upper, Edentulous Lower   Pulmonary neg sleep apnea, COPD,  COPD inhaler, Current Smoker,    breath sounds clear to auscultation- rhonchi (-) wheezing      Cardiovascular hypertension, Pt. on medications + Peripheral Vascular Disease  (-) CAD, (-) Past MI, (-) Cardiac Stents and (-) CABG  Rhythm:Regular Rate:Normal - Systolic murmurs and - Diastolic murmurs    Neuro/Psych negative neurological ROS  negative psych ROS   GI/Hepatic Neg liver ROS, GERD  ,  Endo/Other  negative endocrine ROSneg diabetes  Renal/GU Renal InsufficiencyRenal disease     Musculoskeletal negative musculoskeletal ROS (+)   Abdominal (+) - obese,   Peds  Hematology  (+) anemia ,   Anesthesia Other Findings Past Medical History: No date: Anemia No date: Atrial fibrillation (HCC) No date: COPD (chronic obstructive pulmonary disease) (HCC) No date: GERD (gastroesophageal reflux disease) No date: History of blood clots No date: Hypertension 07/01/2015: Personal history of tobacco use, presenting hazards to  health No date: Renal disorder   Reproductive/Obstetrics                             Anesthesia Physical Anesthesia Plan  ASA: III  Anesthesia Plan: General   Post-op Pain Management:    Induction: Intravenous  PONV Risk Score and Plan: 1 and Ondansetron and Midazolam  Airway Management Planned: Oral ETT  Additional Equipment:   Intra-op Plan:   Post-operative Plan: Extubation in OR  Informed Consent: I have reviewed the patients History and  Physical, chart, labs and discussed the procedure including the risks, benefits and alternatives for the proposed anesthesia with the patient or authorized representative who has indicated his/her understanding and acceptance.   Dental advisory given  Plan Discussed with: CRNA and Anesthesiologist  Anesthesia Plan Comments: (Pt has DNR order, we discussed plan for suspension of DNR order during periop period due to possible need for respiratory support, BP support and potential reactions to medications during the procedure. )        Anesthesia Quick Evaluation

## 2018-02-21 NOTE — Anesthesia Post-op Follow-up Note (Signed)
Anesthesia QCDR form completed.        

## 2018-02-21 NOTE — Progress Notes (Signed)
Patient ID: Eileen Mack, female   DOB: 10-17-1935, 82 y.o.   MRN: [id number]  [facility] PROGRESS NOTE  LORELY BUBB NWG:[id number] DOB: 12-27-1935 DOA: 02/19/2018 PCP: [facility]  HPI/Subjective: In pain s/p endovascular AAA repair  Objective: Vitals:   02/21/18 1400 02/21/18 1500  BP: (!) 170/94 126/73  Pulse: (!) 112 (!) 117  Resp: 16 17  Temp:    SpO2: 97% 97%    Filed Weights   02/19/18 1251 02/19/18 2109 02/21/18 0933  Weight: 74.4 kg 74.8 kg 74.4 kg    ROS: Review of Systems  Constitutional: Negative for chills and fever.  Eyes: Negative for blurred vision.  Respiratory: Negative for cough and shortness of breath.   Cardiovascular: Negative for chest pain.  Gastrointestinal: Negative for abdominal pain, constipation, diarrhea, nausea and vomiting.  Genitourinary: Negative for dysuria.  Musculoskeletal: Positive for back pain. Negative for joint pain.  Neurological: Negative for dizziness and headaches.   Exam: Physical Exam  HENT:  Nose: No mucosal edema.  Mouth/Throat: No oropharyngeal exudate or posterior oropharyngeal edema.  Eyes: Pupils are equal, round, and reactive to light. Conjunctivae, EOM and lids are normal.  Neck: No JVD present. Carotid bruit is not present. No edema present. No thyroid mass and no thyromegaly present.  Cardiovascular: S1 normal and S2 normal. Exam reveals no gallop.  No murmur heard. Pulses:      Dorsalis pedis pulses are 2+ on the right side, and 2+ on the left side.  Respiratory: No respiratory distress. She has no wheezes. She has no rhonchi. She has no rales.  GI: Soft. Bowel sounds are normal. There is no tenderness.  Musculoskeletal:       Right ankle: She exhibits no swelling.       Left ankle: She exhibits no swelling.  Unable to elicit pain in the back when palpating  Lymphadenopathy:    She has no cervical adenopathy.  Neurological: She is alert. No cranial nerve deficit.   Patient able to straight leg raise without a problem  Skin: Skin is warm. No rash noted. Nails show no clubbing.  Psychiatric: She has a normal mood and affect.      Data Reviewed: Basic Metabolic Panel: Recent Labs  Lab 02/19/18 1653 02/21/18 0336  NA 140 142  K 3.6 3.5  CL 106 106  CO2 27 28  GLUCOSE 96 94  BUN 19 30*  CREATININE 0.94 1.22*  CALCIUM 9.5 8.8*  MG  --  1.5*   Liver Function Tests: Recent Labs  Lab 02/19/18 1653  AST 17  ALT 11  ALKPHOS 87  BILITOT 0.5  PROT 7.4  ALBUMIN 3.9   CBC: Recent Labs  Lab 02/19/18 1653 02/21/18 0336  WBC 5.6 5.8  NEUTROABS 3.4  --   HGB 12.5 10.9*  HCT 38.7 34.6*  MCV 95.6 95.8  PLT 311 264     Studies: Ct Abdomen Pelvis Wo Contrast  Result Date: 02/19/2018 CLINICAL DATA:  Abdominal aortic aneurysm. Back pain and hypertension. Ultrasound demonstrating possible increased size. EXAM: CT ANGIOGRAPHY CHEST, ABDOMEN AND PELVIS TECHNIQUE: Multidetector CT imaging through the chest, abdomen and pelvis was performed using the standard protocol during bolus administration of intravenous contrast. Precontrast images were also performed. Multiplanar reconstructed images and MIPs were obtained and reviewed to evaluate the vascular anatomy. CONTRAST:  75mL ISOVUE-370 IOPAMIDOL (ISOVUE-370) INJECTION 76% COMPARISON:  Ultrasound of earlier in the day. Most recent CT abdomen pelvis 07/29/2015. Chest CT 07/03/2015 reviewed. FINDINGS: CTA  CHEST FINDINGS Cardiovascular: No intramural hematoma. Advanced aortic and branch vessel atherosclerosis. Extensive ulcerative plaque in the descending aorta. No aortic aneurysm or dissection. Normal heart size, without pericardial effusion. Multivessel coronary artery atherosclerosis. No central pulmonary embolism, on this non-dedicated study. Mediastinum/Nodes: No mediastinal or hilar adenopathy. Lungs/Pleura: No pleural fluid. Mild centrilobular emphysema. Right lower lobe 7 mm ground-glass nodule is  similar to minimally enlarged from 6 mm on the prior exam. Example image 93/14 today. A superior segment left lower lobe well-circumscribed pulmonary nodule measures 7 mm on image 49/14 and is similar to 07/03/2015, presumably benign. Other soft tissue density pulmonary nodules are on the order of 5-6 mm and less are identified on series 14. These are felt to be similar. Musculoskeletal: No acute osseous abnormality. Review of the MIP images confirms the above findings. CTA ABDOMEN AND PELVIS FINDINGS VASCULAR Aorta: Advanced abdominal aortic and branch vessel atherosclerosis. The infrarenal aorta measures maximally 4.8 x 4.6 cm on image 128/12. Compare 4.0 x 4.2 cm on 07/29/2015. Extensive wall thrombus within. No periaortic hemorrhage or evidence of dissection. Celiac: Widely patent. SMA: Atherosclerotic without significant stenosis. Renals: Suspect mild left renal artery narrowing secondary to atherosclerosis. Single renal arteries bilaterally. IMA: Not confidently visualized proximally. Suspect chronically occluded. Inflow: No iliac artery aneurysm or significant stenosis. Veins: Patent where opacified. Review of the MIP images confirms the above findings. NON-VASCULAR Hepatobiliary: Normal liver. Cholecystectomy, without biliary ductal dilatation. Pancreas: Normal, without mass or ductal dilatation. Spleen: Hypoattenuating lesion within the central spleen is of doubtful clinical significance. Adrenals/Urinary Tract: Normal adrenal glands. No renal calculi or hydronephrosis. No bladder stone. No renal mass on post-contrast imaging. No enhancing bladder mass. Stomach/Bowel: The stomach is underdistended. Greater curvature appears thick walled proximally including at 3.2 cm. Periampullary duodenal diverticulum. Scattered colonic diverticula. Otherwise normal small bowel. Lymphatic: No abdominopelvic adenopathy. Reproductive: Hysterectomy.  No adnexal mass. Other: No significant free fluid. Mild pelvic floor  laxity. No free intraperitoneal air. Musculoskeletal: Lumbosacral spondylosis. Review of the MIP images confirms the above findings. IMPRESSION: 1. Since 07/29/2015, increase in infrarenal abdominal aortic aneurysm, without surrounding hemorrhage or other acute complication. 2. Bilateral pulmonary nodules are primarily similar to 2017. There is a ground-glass nodule which may have enlarged minimally since the prior. Initial follow-up with CT at 6-12 months is recommended to confirm persistence. If persistent, repeat CT is recommended every 2 years until 5 years of stability has been established. This recommendation follows the consensus statement: Guidelines for Management of Incidental Pulmonary Nodules Detected on CT Images: From the Fleischner Society 2017; Radiology 2017; 284:228-243. 3. Aortic atherosclerosis (ICD10-I70.0), coronary artery atherosclerosis and emphysema (ICD10-J43.9). 4. Apparent greater curvature gastric wall thickening is at least partially felt to be due to underdistention. Correlate with any symptoms of gastritis. Electronically Signed   By: Jeronimo Greaves M.D.   On: 02/19/2018 18:35   Ct Angio Chest Aorta W/cm &/or Wo/cm  Result Date: 02/19/2018 CLINICAL DATA:  Abdominal aortic aneurysm. Back pain and hypertension. Ultrasound demonstrating possible increased size. EXAM: CT ANGIOGRAPHY CHEST, ABDOMEN AND PELVIS TECHNIQUE: Multidetector CT imaging through the chest, abdomen and pelvis was performed using the standard protocol during bolus administration of intravenous contrast. Precontrast images were also performed. Multiplanar reconstructed images and MIPs were obtained and reviewed to evaluate the vascular anatomy. CONTRAST:  75mL ISOVUE-370 IOPAMIDOL (ISOVUE-370) INJECTION 76% COMPARISON:  Ultrasound of earlier in the day. Most recent CT abdomen pelvis 07/29/2015. Chest CT 07/03/2015 reviewed. FINDINGS: CTA CHEST FINDINGS Cardiovascular: No intramural hematoma. Advanced aortic  and  branch vessel atherosclerosis. Extensive ulcerative plaque in the descending aorta. No aortic aneurysm or dissection. Normal heart size, without pericardial effusion. Multivessel coronary artery atherosclerosis. No central pulmonary embolism, on this non-dedicated study. Mediastinum/Nodes: No mediastinal or hilar adenopathy. Lungs/Pleura: No pleural fluid. Mild centrilobular emphysema. Right lower lobe 7 mm ground-glass nodule is similar to minimally enlarged from 6 mm on the prior exam. Example image 93/14 today. A superior segment left lower lobe well-circumscribed pulmonary nodule measures 7 mm on image 49/14 and is similar to 07/03/2015, presumably benign. Other soft tissue density pulmonary nodules are on the order of 5-6 mm and less are identified on series 14. These are felt to be similar. Musculoskeletal: No acute osseous abnormality. Review of the MIP images confirms the above findings. CTA ABDOMEN AND PELVIS FINDINGS VASCULAR Aorta: Advanced abdominal aortic and branch vessel atherosclerosis. The infrarenal aorta measures maximally 4.8 x 4.6 cm on image 128/12. Compare 4.0 x 4.2 cm on 07/29/2015. Extensive wall thrombus within. No periaortic hemorrhage or evidence of dissection. Celiac: Widely patent. SMA: Atherosclerotic without significant stenosis. Renals: Suspect mild left renal artery narrowing secondary to atherosclerosis. Single renal arteries bilaterally. IMA: Not confidently visualized proximally. Suspect chronically occluded. Inflow: No iliac artery aneurysm or significant stenosis. Veins: Patent where opacified. Review of the MIP images confirms the above findings. NON-VASCULAR Hepatobiliary: Normal liver. Cholecystectomy, without biliary ductal dilatation. Pancreas: Normal, without mass or ductal dilatation. Spleen: Hypoattenuating lesion within the central spleen is of doubtful clinical significance. Adrenals/Urinary Tract: Normal adrenal glands. No renal calculi or hydronephrosis. No bladder  stone. No renal mass on post-contrast imaging. No enhancing bladder mass. Stomach/Bowel: The stomach is underdistended. Greater curvature appears thick walled proximally including at 3.2 cm. Periampullary duodenal diverticulum. Scattered colonic diverticula. Otherwise normal small bowel. Lymphatic: No abdominopelvic adenopathy. Reproductive: Hysterectomy.  No adnexal mass. Other: No significant free fluid. Mild pelvic floor laxity. No free intraperitoneal air. Musculoskeletal: Lumbosacral spondylosis. Review of the MIP images confirms the above findings. IMPRESSION: 1. Since 07/29/2015, increase in infrarenal abdominal aortic aneurysm, without surrounding hemorrhage or other acute complication. 2. Bilateral pulmonary nodules are primarily similar to 2017. There is a ground-glass nodule which may have enlarged minimally since the prior. Initial follow-up with CT at 6-12 months is recommended to confirm persistence. If persistent, repeat CT is recommended every 2 years until 5 years of stability has been established. This recommendation follows the consensus statement: Guidelines for Management of Incidental Pulmonary Nodules Detected on CT Images: From the Fleischner Society 2017; Radiology 2017; 284:228-243. 3. Aortic atherosclerosis (ICD10-I70.0), coronary artery atherosclerosis and emphysema (ICD10-J43.9). 4. Apparent greater curvature gastric wall thickening is at least partially felt to be due to underdistention. Correlate with any symptoms of gastritis. Electronically Signed   By: Jeronimo Greaves M.D.   On: 02/19/2018 18:35   Ct Angio Abd/pel W And/or Wo Contrast  Result Date: 02/19/2018 CLINICAL DATA:  Abdominal aortic aneurysm. Back pain and hypertension. Ultrasound demonstrating possible increased size. EXAM: CT ANGIOGRAPHY CHEST, ABDOMEN AND PELVIS TECHNIQUE: Multidetector CT imaging through the chest, abdomen and pelvis was performed using the standard protocol during bolus administration of intravenous  contrast. Precontrast images were also performed. Multiplanar reconstructed images and MIPs were obtained and reviewed to evaluate the vascular anatomy. CONTRAST:  75mL ISOVUE-370 IOPAMIDOL (ISOVUE-370) INJECTION 76% COMPARISON:  Ultrasound of earlier in the day. Most recent CT abdomen pelvis 07/29/2015. Chest CT 07/03/2015 reviewed. FINDINGS: CTA CHEST FINDINGS Cardiovascular: No intramural hematoma. Advanced aortic and branch vessel atherosclerosis. Extensive ulcerative plaque  in the descending aorta. No aortic aneurysm or dissection. Normal heart size, without pericardial effusion. Multivessel coronary artery atherosclerosis. No central pulmonary embolism, on this non-dedicated study. Mediastinum/Nodes: No mediastinal or hilar adenopathy. Lungs/Pleura: No pleural fluid. Mild centrilobular emphysema. Right lower lobe 7 mm ground-glass nodule is similar to minimally enlarged from 6 mm on the prior exam. Example image 93/14 today. A superior segment left lower lobe well-circumscribed pulmonary nodule measures 7 mm on image 49/14 and is similar to 07/03/2015, presumably benign. Other soft tissue density pulmonary nodules are on the order of 5-6 mm and less are identified on series 14. These are felt to be similar. Musculoskeletal: No acute osseous abnormality. Review of the MIP images confirms the above findings. CTA ABDOMEN AND PELVIS FINDINGS VASCULAR Aorta: Advanced abdominal aortic and branch vessel atherosclerosis. The infrarenal aorta measures maximally 4.8 x 4.6 cm on image 128/12. Compare 4.0 x 4.2 cm on 07/29/2015. Extensive wall thrombus within. No periaortic hemorrhage or evidence of dissection. Celiac: Widely patent. SMA: Atherosclerotic without significant stenosis. Renals: Suspect mild left renal artery narrowing secondary to atherosclerosis. Single renal arteries bilaterally. IMA: Not confidently visualized proximally. Suspect chronically occluded. Inflow: No iliac artery aneurysm or significant  stenosis. Veins: Patent where opacified. Review of the MIP images confirms the above findings. NON-VASCULAR Hepatobiliary: Normal liver. Cholecystectomy, without biliary ductal dilatation. Pancreas: Normal, without mass or ductal dilatation. Spleen: Hypoattenuating lesion within the central spleen is of doubtful clinical significance. Adrenals/Urinary Tract: Normal adrenal glands. No renal calculi or hydronephrosis. No bladder stone. No renal mass on post-contrast imaging. No enhancing bladder mass. Stomach/Bowel: The stomach is underdistended. Greater curvature appears thick walled proximally including at 3.2 cm. Periampullary duodenal diverticulum. Scattered colonic diverticula. Otherwise normal small bowel. Lymphatic: No abdominopelvic adenopathy. Reproductive: Hysterectomy.  No adnexal mass. Other: No significant free fluid. Mild pelvic floor laxity. No free intraperitoneal air. Musculoskeletal: Lumbosacral spondylosis. Review of the MIP images confirms the above findings. IMPRESSION: 1. Since 07/29/2015, increase in infrarenal abdominal aortic aneurysm, without surrounding hemorrhage or other acute complication. 2. Bilateral pulmonary nodules are primarily similar to 2017. There is a ground-glass nodule which may have enlarged minimally since the prior. Initial follow-up with CT at 6-12 months is recommended to confirm persistence. If persistent, repeat CT is recommended every 2 years until 5 years of stability has been established. This recommendation follows the consensus statement: Guidelines for Management of Incidental Pulmonary Nodules Detected on CT Images: From the Fleischner Society 2017; Radiology 2017; 284:228-243. 3. Aortic atherosclerosis (ICD10-I70.0), coronary artery atherosclerosis and emphysema (ICD10-J43.9). 4. Apparent greater curvature gastric wall thickening is at least partially felt to be due to underdistention. Correlate with any symptoms of gastritis. Electronically Signed   By: Jeronimo Greaves M.D.   On: 02/19/2018 18:35    Scheduled Meds: . carvedilol  12.5 mg Oral BID WC  . clonazePAM  0.5 mg Oral QHS  . cloNIDine  0.1 mg Oral BID  . [START ON 02/22/2018] docusate sodium  100 mg Oral Daily  . enoxaparin (LOVENOX) injection  40 mg Subcutaneous Q24H  . famotidine      . famotidine  20 mg Oral BID  . ferrous sulfate  325 mg Oral Q breakfast  . gabapentin  600 mg Oral BID  . gemfibrozil  600 mg Oral BID AC  . levothyroxine  100 mcg Oral Q0600  . lisinopril  20 mg Oral Daily  . multivitamin with minerals  1 tablet Oral Daily  . pantoprazole  40 mg Oral Daily  .  pravastatin  40 mg Oral QHS  . senna  1 tablet Oral BID  . sodium chloride flush  3 mL Intravenous Q12H  . sodium chloride flush      . venlafaxine XR  225 mg Oral Daily   Continuous Infusions: . sodium chloride 75 mL/hr at 02/21/18 0421  . sodium chloride 75 mL/hr at 02/21/18 1600  . sodium chloride    .  ceFAZolin (ANCEF) IV 2 g (02/21/18 1650)  . DOPamine    . magnesium sulfate 1 - 4 g bolus IVPB    . nitroGLYCERIN      Assessment/Plan:  1. Symptomatic abdominal aortic aneurysm s/p endovascular repair today. Further mgmt per vascular and ICU team 2. Back pain: likely muscular. 3. Hypertension on Coreg and lisinopril 4. Hypothyroidism unspecified on levothyroxine 5. Hyperlipidemia unspecified on pravastatin 6. GERD on Protonix  Code Status:     Code Status Orders  (From admission, onward)         Start     Ordered   02/19/18 2113  Do not attempt resuscitation (DNR)  Continuous    Question Answer Comment  In the event of cardiac or respiratory ARREST Do not call a "code blue"   In the event of cardiac or respiratory ARREST Do not perform Intubation, CPR, defibrillation or ACLS   In the event of cardiac or respiratory ARREST Use medication by any route, position, wound care, and other measures to relive pain and suffering. May use oxygen, suction and manual treatment of airway obstruction  as needed for comfort.      02/19/18 2112        Code Status History    Date Active Date Inactive Code Status Order ID Comments User Context   07/29/2015 1141 08/01/2015 1401 Full Code [id number]  Katha Hamming, MD ED    Advance Directive Documentation     Most Recent Value  Type of Advance Directive  Out of facility DNR (pink MOST or yellow form)  Pre-existing out of facility DNR order (yellow form or pink MOST form)  Physician notified to receive inpatient order  "MOST" Form in Place?  -     Disposition Plan: Depending on vascular and ICU team input  Consultants:  Vascular surgery  Time spent: 10 minutes  Zamani Crocker [employer]

## 2018-02-21 NOTE — Progress Notes (Signed)
PT Cancellation Note  Patient Details Name: Eileen Mack MRN: [id number] DOB: 10-May-1935   Cancelled Treatment:      PT consult received, chart reviewed. Pt is scheduled for endovascular repair of AAA this date and will be held from PT at this time. Will likely need new orders following surgery and will re-attempt once pt is available and new orders received.    Mickel Duhamel, SPT 02/21/2018, 9:03 AM

## 2018-02-21 NOTE — Progress Notes (Signed)
Foley catheter was removed ahead of time per Dr. Gilda Crease due to patient discomfort. Tolerated well.   [name], RN

## 2018-02-21 NOTE — Anesthesia Procedure Notes (Signed)
Procedure Name: Intubation Performed by: Lance Muss, CRNA Pre-anesthesia Checklist: Patient identified, Patient being monitored, Timeout performed, Emergency Drugs available and Suction available Patient Re-evaluated:Patient Re-evaluated prior to induction Oxygen Delivery Method: Circle system utilized Preoxygenation: Pre-oxygenation with 100% oxygen Induction Type: IV induction Ventilation: Mask ventilation without difficulty Laryngoscope Size: Mac and 3 Grade View: Grade II Tube type: Oral Tube size: 7.0 mm Number of attempts: 1 Airway Equipment and Method: Stylet Placement Confirmation: ETT inserted through vocal cords under direct vision,  positive ETCO2 and breath sounds checked- equal and bilateral Secured at: 21 cm Tube secured with: Tape Dental Injury: Teeth and Oropharynx as per pre-operative assessment

## 2018-02-21 NOTE — Op Note (Signed)
OPERATIVE NOTE   PROCEDURE: 1. US guidance for vascular access, bilateral femoral arteries 2. Catheter placement into aorta from bilateral femoral approaches 3. Placement of a 31 mm proximal, 15 cm length Gore Excluder Endoprosthesis main body right with a 12 mm diameter by 10 cm length left contralateral limb 4. ProGlide closure devices bilateral femoral arteries  PRE-OPERATIVE DIAGNOSIS: AAA, symptomatic with significant growth  POST-OPERATIVE DIAGNOSIS: same  SURGEON: Leotis Pain, MD and Hortencia Pilar, MD - Co-surgeons  ANESTHESIA: General  ESTIMATED BLOOD LOSS: 10 cc  FINDING(S): 1.  AAA  SPECIMEN(S):  none  INDICATIONS:   Eileen Mack is a 82 y.o. female who presents with rapid growth of her abdominal aortic aneurysm which was tender to palpation.  She was also complaining of abdominal and back pain worrisome for symptomatic aneurysm. The anatomy was suitable for endovascular repair.  Risks and benefits of repair in an endovascular fashion were discussed and informed consent was obtained. Co-surgeons are used to expedite the procedure and reduce operative time as bilateral work needs to be done.  DESCRIPTION: After obtaining full informed written consent, the patient was brought back to the operating room and placed supine upon the operating table.  The patient received IV antibiotics prior to induction.  After obtaining adequate anesthesia, the patient was prepped and draped in the standard fashion for endovascular AAA repair.  We then began by gaining access to both femoral arteries with US guidance with me working on the left and Dr. Delana Meyer working on the right.  The femoral arteries were found to be patent and accessed without difficulty with a needle under ultrasound guidance without difficulty on each side and permanent images were recorded.  We then placed 2 proglide devices on each side in a pre-close fashion and placed 8 French sheaths. The patient was then  given 6000 units of intravenous heparin. The Pigtail catheter was placed into the aorta from the left side. Using this image, we selected a 31 mm diameter by 15 cm length Main body device.  Over a stiff wire, an 13 French sheath was placed up the right. The main body was then placed through the 18 French sheath up the right. A Kumpe catheter was placed up the left side and a magnified image at the renal arteries was performed. The main body was then deployed just below the lowest renal artery. The Kumpe catheter was used to cannulate the contralateral gate without difficulty and successful cannulation was confirmed by twirling the pigtail catheter in the main body. We then placed a stiff wire and a retrograde arteriogram was performed through the left femoral sheath. We upsized to the 12 [place] sheath for the contralateral limb on the left side and a 12 mm diameter by 10 cm length limb was selected and deployed. The main body deployment was then completed. Based off the angiographic findings, extension limbs were not necessary.  We did however use 10 mm diameter by 6 cm length noncompliant balloons to treat the proximal common iliac arteries bilaterally due to strength in these areas.  They were both inflated to 8 atm for 1 minute. All junction points and seals zones were treated with the compliant balloon. The pigtail catheter was then replaced and a completion angiogram was performed.  No endoleak was detected on completion angiography. The renal arteries were found to be widely patent.  The hypogastric arteries were found to be patent although they were small. At this point we elected to terminate the procedure.  We secured the pro glide devices for hemostasis on the femoral arteries. The skin incision was closed with a 4-0 Monocryl. Dermabond and pressure dressing were placed. The patient was taken to the recovery room in stable condition having tolerated the procedure well.  COMPLICATIONS: none  CONDITION:  stable  Leotis Pain  02/21/2018, 12:01 PM   This note was created with Dragon Medical transcription system. Any errors in dictation are purely unintentional.

## 2018-02-22 ENCOUNTER — Encounter: Payer: Self-pay | Admitting: Vascular Surgery

## 2018-02-22 ENCOUNTER — Ambulatory Visit
Admission: RE | Admit: 2018-02-22 | Discharge: 2018-02-22 | Disposition: A | Discharge status: Home / Self Care | Payer: Medicare HMO | Source: Ambulatory Visit | Attending: Family Medicine | Admitting: Family Medicine

## 2018-02-22 LAB — BASIC METABOLIC PANEL
Anion gap: 10 (ref 5–15)
BUN: 24 mg/dL — ABNORMAL HIGH (ref 8–23)
CO2: 25 mmol/L (ref 22–32)
Calcium: 8.3 mg/dL — ABNORMAL LOW (ref 8.9–10.3)
Chloride: 104 mmol/L (ref 98–111)
Creatinine, Ser: 0.96 mg/dL (ref 0.44–1.00)
GFR calc Af Amer: >60 mL/min (ref >60)
GFR calc non Af Amer: 55 mL/min — ABNORMAL LOW (ref >60)
Glucose, Bld: 261 mg/dL — ABNORMAL HIGH (ref 70–99)
Potassium: 3.3 mmol/L — ABNORMAL LOW (ref 3.5–5.1)
Sodium: 139 mmol/L (ref 135–145)

## 2018-02-22 LAB — CBC
HCT: 32.3 % — ABNORMAL LOW (ref 36.0–46.0)
Hemoglobin: 10.6 g/dL — ABNORMAL LOW (ref 12.0–15.0)
MCH: 30.8 pg (ref 26.0–34.0)
MCHC: 32.8 g/dL (ref 30.0–36.0)
MCV: 93.9 fL (ref 80.0–100.0)
Platelets: 241 10*3/uL (ref 150–400)
RBC: 3.44 MIL/uL — ABNORMAL LOW (ref 3.87–5.11)
RDW: 13.2 % (ref 11.5–15.5)
WBC: 11.5 10*3/uL — ABNORMAL HIGH (ref 4.0–10.5)
nRBC: 0 % (ref 0.0–0.2)

## 2018-02-22 MED ORDER — APIXABAN 5 MG PO TABS: 5.0000 mg | ORAL_TABLET | Freq: Two times a day (BID) | ORAL | 0 refills | Status: DC

## 2018-02-22 NOTE — Progress Notes (Signed)
Discharge information given, patient verbalized information back.  All questions answered, no further needs. ABC intact. Patient taken to car via w/c and volunteer services.

## 2018-02-22 NOTE — Discharge Instructions (Signed)
Abdominal Aortic Aneurysm Endograft Repair, Care After This sheet gives you information about how to care for yourself after your procedure. Your health care provider may also give you more specific instructions. If you have problems or questions, contact your health care provider. What can I expect after the procedure? After the procedure, it is common to have:  Pain or soreness at the incision site.  Tiredness (fatigue).  Follow these instructions at home: Activity  Get plenty of rest.  Follow instructions from your health care provider about how much you should move around and how far you should go when you take short walks. Start walking farther when your health care provider says it is okay.  Limit your activities as told by your health care provider.  Return to your normal activities as told by your health care provider. Ask your health care provider what activities are safe for you. Incision care   Follow instructions from your health care provider about how to take care of your incisions. Make sure you: ? Wash your hands with soap and water before you change your bandage (dressing). If soap and water are not available, use hand sanitizer. ? Change your dressing as told by your health care provider. ? Leave stitches (sutures), skin glue, or adhesive strips in place. These skin closures may need to stay in place for 2 weeks or longer. If adhesive strip edges start to loosen and curl up, you may trim the loose edges. Do not remove adhesive strips completely unless your health care provider tells you to do that.  Keep the incision area clean and dry.  Do not take baths, swim, or use a hot tub until your health care provider approves. Ask your health care provider if you can take showers. You may only be allowed to take sponge baths for bathing.  Check your incision area every day for signs of infection. Check for: ? More redness, swelling, or pain. ? More fluid or  blood. ? Warmth. ? Pus or a bad smell. Lifestyle  Do not use any products that contain nicotine or tobacco, such as cigarettes and e-cigarettes. If you need help quitting, ask your health care provider.  Make any other lifestyle changes that your health care provider suggests. These may include: ? Keeping your blood pressure under control. ? Finding ways to lower stress. ? Eating healthy foods that are good for your heart, such as vegetables, fruits, and whole grains that add fiber to your diet. ? Getting regular exercise. General instructions  Take over-the-counter and prescription medicines only as told by your health care provider.  Keep all follow-up visits as told by your health care provider. This is important. Contact a health care provider if:  You have pain in your abdomen, chest, or back.  You have more redness, swelling, or pain around an incision.  You have more fluid or blood coming from an incision.  Your incision feels warm to the touch.  You have pus or a bad smell coming from an incision.  You have a fever. Get help right away if:  You have trouble breathing.  You suddenly have pain in your legs or you have trouble moving either of your legs.  You faint or you feel very light-headed. This information is not intended to replace advice given to you by your health care provider. Make sure you discuss any questions you have with your health care provider. Document Released: 01/07/2015 Document Revised: 11/06/2015 Document Reviewed: 07/13/2015 Elsevier Interactive Patient Education    2018 [employer]  

## 2018-02-22 NOTE — Evaluation (Signed)
Physical Therapy Evaluation Patient Details Name: Eileen Mack MRN: [id number] DOB: [birth year] Today's Date: 02/22/2018   History of Present Illness  82 y.o. female with a known history of hypertension, Genella Rife, atrial fibrillation, AAA without rupture followed at vascular surgery comes to the emergency room with complaints of back pain for 2 to 3 weeks. Had AAA repair 10/23 and is feeling well post surgery.  Clinical Impression  Pt did very well with PT exam and though she reports some continued mild LBP (likely associated from moving boxes, reports no surgical associated/AAA pain).  She showed good balance and confidence with mobility, ambulated 250 ft w/o AD and no LOBs or safety concerns and ultimately is safe to go home.  No further PT needs at this time, PT will sign off.    Follow Up Recommendations No PT follow up    Equipment Recommendations  None recommended by PT    Recommendations for Other Services       Precautions / Restrictions Precautions Precautions: None Restrictions Weight Bearing Restrictions: No      Mobility  Bed Mobility Overal bed mobility: Independent                Transfers Overall transfer level: Independent                  Ambulation/Gait Ambulation/Gait assistance: Independent Gait Distance (Feet): 250 Feet Assistive device: None       General Gait Details: Pt with good confidence and consistent cadence with ambulation.  She had no LOBs, no c/o fatigue and was able to talk the entire time w/o issue.  Stairs Stairs: Yes Stairs assistance: Modified independent (Device/Increase time) Stair Management: One rail Right Number of Stairs: 4 General stair comments: Pt with easy confidence on the stairs, able to take multiple steps w/o UE use  Wheelchair Mobility    Modified Rankin (Stroke Patients Only)       Balance Overall balance assessment: Independent                                            Pertinent Vitals/Pain Pain Assessment: (c/o mild L low back pain)    Home Living Family/patient expects to be discharged to:: Private residence Living Arrangements: (lives above daughter (& son-in-law), helps daughter t/o day) Available Help at Discharge: (is caregiver for daughter during the day) Type of Home: Apartment Home Access: Stairs to enter Entrance Stairs-Rails: Right Entrance Stairs-Number of Steps: flight Home Layout: One level        Prior Function Level of Independence: Independent         Comments: Pt still driving, very active, able to do all she needs     Hand Dominance        Extremity/Trunk Assessment   Upper Extremity Assessment Upper Extremity Assessment: Overall WFL for tasks assessed;Generalized weakness(h/o R shoulder pain, L elbow weaker than R)    Lower Extremity Assessment Lower Extremity Assessment: Overall WFL for tasks assessed       Communication   Communication: No difficulties  Cognition Arousal/Alertness: Awake/alert Behavior During Therapy: WFL for tasks assessed/performed Overall Cognitive Status: Within Functional Limits for tasks assessed  General Comments      Exercises     Assessment/Plan    PT Assessment Patent does not need any further PT services  PT Problem List         PT Treatment Interventions      PT Goals (Current goals can be found in the Care Plan section)  Acute Rehab PT Goals Patient Stated Goal: go home PT Goal Formulation: All assessment and education complete, DC therapy    Frequency     Barriers to discharge        Co-evaluation               AM-PAC PT "6 Clicks" Daily Activity  Outcome Measure Difficulty turning over in bed (including adjusting bedclothes, sheets and blankets)?: None Difficulty moving from lying on back to sitting on the side of the bed? : None Difficulty sitting down on and standing up from a chair  with arms (e.g., wheelchair, bedside commode, etc,.)?: None Help needed moving to and from a bed to chair (including a wheelchair)?: None Help needed walking in hospital room?: None Help needed climbing 3-5 steps with a railing? : None 6 Click Score: 24    End of Session Equipment Utilized During Treatment: Gait belt Activity Tolerance: Patient tolerated treatment well Patient left: in bed;with call bell/phone within reach   PT Visit Diagnosis: Muscle weakness (generalized) (M62.81)    Time: 1610-9604 PT Time Calculation (min) (ACUTE ONLY): 16 min   Charges:   PT Evaluation $PT Eval Low Complexity: 1 Low          Malachi Pro, DPT 02/22/2018, 10:03 AM

## 2018-02-22 NOTE — Anesthesia Postprocedure Evaluation (Signed)
Anesthesia Post Note  Patient: Eileen Mack  Procedure(s) Performed: ENDOVASCULAR REPAIR/STENT GRAFT (N/A )  Patient location during evaluation: ICU Anesthesia Type: General Level of consciousness: awake and alert and oriented Pain management: pain level controlled Vital Signs Assessment: post-procedure vital signs reviewed and stable Respiratory status: spontaneous breathing, nonlabored ventilation and respiratory function stable Cardiovascular status: blood pressure returned to baseline and stable Postop Assessment: no signs of nausea or vomiting Anesthetic complications: no     Last Vitals:  Vitals:   02/22/18 0909 02/22/18 1012  BP: 96/76 130/72  Pulse: (!) 59 (!) 57  Resp: 16   Temp: 36.5 C   SpO2: 99%     Last Pain:  Vitals:   02/22/18 0909  TempSrc: Oral  PainSc:                  Delano Frate

## 2018-02-22 NOTE — Care Management Important Message (Signed)
Patient discharged prior to my arrival to deliver concurrent copy of Medicare IM.

## 2018-02-23 ENCOUNTER — Encounter: Payer: Self-pay | Admitting: Vascular Surgery

## 2018-02-24 NOTE — Discharge Summary (Signed)
[facility] [place] [facility]   PATIENT NAME: Eileen Mack    MR#:  [id number]  DATE OF BIRTH:  October 21, 1935  DATE OF ADMISSION:  02/19/2018   ADMITTING PHYSICIAN: Enedina Finner, MD  DATE OF DISCHARGE: 02/22/2018 12:14 PM  PRIMARY CARE PHYSICIAN: [place], [facility]   ADMISSION DIAGNOSIS:  AAA (abdominal aortic aneurysm) (HCC) [I71.4] Abdominal aneurysm (HCC) [I71.4] Hypertensive urgency [I16.0] History of abdominal aortic aneurysm (AAA) [Z86.79] DISCHARGE DIAGNOSIS:  Active Problems:   AAA (abdominal aortic aneurysm) (HCC)  SECONDARY DIAGNOSIS:   Past Medical History:  Diagnosis Date  . Anemia   . Atrial fibrillation (HCC)   . COPD (chronic obstructive pulmonary disease) (HCC)   . GERD (gastroesophageal reflux disease)   . History of blood clots   . Hypertension   . Personal history of tobacco use, presenting hazards to health 07/01/2015  . Renal disorder    HOSPITAL COURSE:  82 y.o. female admitted with rapid growth of her abdominal aortic aneurysm which was tender to palpation.  She was also complaining of abdominal and back pain worrisome for symptomatic aneurysm.  1. Symptomatic abdominal aortic aneurysm s/p endovascular repair by vascular surgery. 2. Back pain: likely muscular. 3. Hypertension on Coreg and lisinopril 4. Hypothyroidism unspecified on levothyroxine 5. Hyperlipidemia unspecified on pravastatin 6. GERD on Protonix  DISCHARGE CONDITIONS:  stable CONSULTS OBTAINED:  Treatment Team:  Renford Dills, MD DRUG ALLERGIES:   Allergies  Allergen Reactions  . Iodinated Diagnostic Agents Hives, Itching and Rash    She states she has to be premedicated. SPM  . Sulfa Antibiotics Rash   DISCHARGE MEDICATIONS:   Allergies as of 02/22/2018      Reactions   Iodinated Diagnostic Agents Hives, Itching, Rash   She states she has to be premedicated. SPM   Sulfa Antibiotics Rash      Medication List    TAKE these  medications   apixaban 5 MG Tabs tablet Commonly known as:  ELIQUIS Take 1 tablet (5 mg total) by mouth 2 (two) times daily.   aspirin EC 81 MG tablet Take 81 mg by mouth daily.   carvedilol 25 MG tablet Commonly known as:  COREG Take 25 mg by mouth 2 (two) times daily with a meal.   clonazePAM 0.5 MG tablet Commonly known as:  KLONOPIN Take 0.5 mg by mouth at bedtime as needed for anxiety.   cloNIDine 0.2 MG tablet Commonly known as:  CATAPRES Take 0.2 mg by mouth 2 (two) times daily.   diphenhydramine-acetaminophen 25-500 MG Tabs tablet Commonly known as:  TYLENOL PM Take 1 tablet by mouth at bedtime.   ferrous sulfate 325 (65 FE) MG tablet Take 1 tablet (325 mg total) by mouth daily with breakfast.   gabapentin 600 MG tablet Commonly known as:  NEURONTIN Take 600 mg by mouth 2 (two) times daily.   gemfibrozil 600 MG tablet Commonly known as:  LOPID Take 600 mg by mouth 2 (two) times daily before a meal.   levothyroxine 100 MCG tablet Commonly known as:  SYNTHROID, LEVOTHROID Take 100 mcg by mouth daily before breakfast.   lisinopril 20 MG tablet Commonly known as:  PRINIVIL,ZESTRIL Take 20 mg by mouth daily.   multivitamin tablet Take 1 tablet by mouth daily.   nitroGLYCERIN 0.4 MG SL tablet Commonly known as:  NITROSTAT Place 0.4 mg under the tongue every 5 (five) minutes as needed for chest pain.   omeprazole 20 MG capsule Commonly known as:  PRILOSEC Take 20 mg by mouth daily.   pravastatin 40 MG tablet Commonly known as:  PRAVACHOL Take 40 mg by mouth at bedtime.   venlafaxine XR 75 MG 24 hr capsule Commonly known as:  EFFEXOR-XR Take 225 mg by mouth daily.      DISCHARGE INSTRUCTIONS:   DIET:  Cardiac diet DISCHARGE CONDITION:  Good ACTIVITY:  Activity as tolerated OXYGEN:  Home Oxygen: No.  Oxygen Delivery: room air DISCHARGE LOCATION:  home   If you experience worsening of your admission symptoms, develop shortness of breath,  life threatening emergency, suicidal or homicidal thoughts you must seek medical attention immediately by calling 911 or calling your MD immediately  if symptoms less severe.  You Must read complete instructions/literature along with all the possible adverse reactions/side effects for all the Medicines you take and that have been prescribed to you. Take any new Medicines after you have completely understood and accpet all the possible adverse reactions/side effects.   Please note  You were cared for by a hospitalist during your hospital stay. If you have any questions about your discharge medications or the care you received while you were in the hospital after you are discharged, you can call the unit and asked to speak with the hospitalist on call if the hospitalist that took care of you is not available. Once you are discharged, your primary care physician will handle any further medical issues. Please note that NO REFILLS for any discharge medications will be authorized once you are discharged, as it is imperative that you return to your primary care physician (or establish a relationship with a primary care physician if you do not have one) for your aftercare needs so that they can reassess your need for medications and monitor your lab values.    On the day of Discharge:  VITAL SIGNS:  Blood pressure 130/72, pulse (!) 57, temperature 97.7 F (36.5 C), temperature source Oral, resp. rate 16, height 5\' 7"  (1.702 m), weight 74.4 kg, SpO2 99 %. PHYSICAL EXAMINATION:  GENERAL:  82 y.o.-year-old patient lying in the bed with no acute distress.  EYES: Pupils equal, round, reactive to light and accommodation. No scleral icterus. Extraocular muscles intact.  HEENT: Head atraumatic, normocephalic. Oropharynx and nasopharynx clear.  NECK:  Supple, no jugular venous distention. No thyroid enlargement, no tenderness.  LUNGS: Normal breath sounds bilaterally, no wheezing, rales,rhonchi or crepitation. No  use of accessory muscles of respiration.  CARDIOVASCULAR: S1, S2 normal. No murmurs, rubs, or gallops.  ABDOMEN: Soft, non-tender, non-distended. Bowel sounds present. No organomegaly or mass.  EXTREMITIES: No pedal edema, cyanosis, or clubbing.  NEUROLOGIC: Cranial nerves II through XII are intact. Muscle strength 5/5 in all extremities. Sensation intact. Gait not checked.  PSYCHIATRIC: The patient is alert and oriented x 3.  SKIN: No obvious rash, lesion, or ulcer.  DATA REVIEW:   CBC Recent Labs  Lab 02/22/18 0432  WBC 11.5*  HGB 10.6*  HCT 32.3*  PLT 241    Chemistries  Recent Labs  Lab 02/19/18 1653 02/21/18 0336 02/22/18 0432  NA 140 142 139  K 3.6 3.5 3.3*  CL 106 106 104  CO2 27 28 25   GLUCOSE 96 94 261*  BUN 19 30* 24*  CREATININE 0.94 1.22* 0.96  CALCIUM 9.5 8.8* 8.3*  MG  --  1.5*  --   AST 17  --   --   ALT 11  --   --   ALKPHOS 87  --   --  BILITOT 0.5  --   --       Follow-up Information    Lazarus Gowda, MD. Go on 03/01/2018.   Specialty:  Family Medicine Why:  @1 :20pm Contact information: [address] [place] [address] [phone]        Schnier, Latina Craver, MD Follow up in 1 week(s).   Specialties:  Vascular Surgery, Cardiology, Radiology, Vascular Surgery Why:  First post AAA repair follow up. Can see APP. Incision check.  Contact information: [address] [place] [address] [phone]           Management plans discussed with the patient, family and they are in agreement.  CODE STATUS: Prior   TOTAL TIME TAKING CARE OF THIS PATIENT: 45 minutes.    Delfino Lovett M.D on 02/24/2018 at 2:33 PM  Between 7am to 6pm - Pager - [phone]  After 6pm go to [email or web address] - Social research officer, government  [facility]  Office  [phone]  CC: Primary care physician; [facility]   Note: This dictation was prepared with Dragon dictation along with smaller phrase technology. Any  transcriptional errors that result from this process are unintentional.

## 2018-02-27 ENCOUNTER — Telehealth: Payer: Self-pay

## 2018-02-27 NOTE — Telephone Encounter (Signed)
EMMI Follow-up: Received a voice message from Eileen Mack as she had received our automated calls post discharge.  I talked with Eileen Mack and she said she was doing better and was aware of her follow-up appointments.  No needs noted.  She thanked me for calling.

## 2018-03-13 ENCOUNTER — Encounter (INDEPENDENT_AMBULATORY_CARE_PROVIDER_SITE_OTHER): Payer: Medicare HMO

## 2018-03-13 ENCOUNTER — Ambulatory Visit (INDEPENDENT_AMBULATORY_CARE_PROVIDER_SITE_OTHER): Payer: Medicare HMO | Admitting: Vascular Surgery

## 2018-04-18 ENCOUNTER — Other Ambulatory Visit (INDEPENDENT_AMBULATORY_CARE_PROVIDER_SITE_OTHER): Payer: Medicare HMO

## 2018-04-18 ENCOUNTER — Ambulatory Visit (INDEPENDENT_AMBULATORY_CARE_PROVIDER_SITE_OTHER): Payer: Medicare HMO | Admitting: Vascular Surgery

## 2018-05-22 ENCOUNTER — Other Ambulatory Visit: Payer: Self-pay | Admitting: Internal Medicine

## 2018-06-08 ENCOUNTER — Ambulatory Visit (INDEPENDENT_AMBULATORY_CARE_PROVIDER_SITE_OTHER): Payer: Medicare HMO | Admitting: Vascular Surgery

## 2018-06-08 ENCOUNTER — Ambulatory Visit (INDEPENDENT_AMBULATORY_CARE_PROVIDER_SITE_OTHER): Payer: Medicare HMO

## 2018-06-08 ENCOUNTER — Encounter (INDEPENDENT_AMBULATORY_CARE_PROVIDER_SITE_OTHER): Payer: Self-pay | Admitting: Vascular Surgery

## 2018-06-08 VITALS — BP 183/74 | HR 65 | Resp 16 | Ht 66.0 in | Wt 166.2 lb

## 2018-06-08 DIAGNOSIS — I714 Abdominal aortic aneurysm, without rupture, unspecified: Secondary | ICD-10-CM

## 2018-06-08 DIAGNOSIS — E785 Hyperlipidemia, unspecified: Secondary | ICD-10-CM | POA: Diagnosis not present

## 2018-06-08 DIAGNOSIS — F17200 Nicotine dependence, unspecified, uncomplicated: Secondary | ICD-10-CM | POA: Diagnosis not present

## 2018-06-08 DIAGNOSIS — I10 Essential (primary) hypertension: Secondary | ICD-10-CM | POA: Diagnosis not present

## 2018-06-08 NOTE — Assessment & Plan Note (Signed)
lipid control important in reducing the progression of atherosclerotic disease. Continue statin therapy  

## 2018-06-08 NOTE — Progress Notes (Signed)
MRN : [id number]  Eileen Mack is a 83 y.o. (06-04-1935) female who presents with chief complaint of  Chief Complaint  Patient presents with  . Follow-up  .  History of Present Illness: Patient returns today in follow up of her abdominal aortic aneurysm.  She is about 4 months status post endovascular repair of her aneurysm and has done well.  This has resulted in resolution of her back pain and she has no specific aneurysm related complaints today.  Her aortic duplex today shows a patent stent graft without endoleak and shrinkage of her abdominal aortic aneurysm sac now measuring 4.6 x 4.8 in maximal diameter.  Current Outpatient Medications  Medication Sig Dispense Refill  . aspirin EC 81 MG tablet Take 81 mg by mouth daily.    . carvedilol (COREG) 25 MG tablet Take 25 mg by mouth 2 (two) times daily with a meal.    . clonazePAM (KLONOPIN) 0.5 MG tablet Take 0.5 mg by mouth at bedtime as needed for anxiety.     . cloNIDine (CATAPRES) 0.2 MG tablet Take 0.2 mg by mouth 2 (two) times daily.    . diphenhydramine-acetaminophen (TYLENOL PM) 25-500 MG TABS tablet Take 1 tablet by mouth at bedtime.    . ferrous sulfate 325 (65 FE) MG tablet Take 1 tablet (325 mg total) by mouth daily with breakfast. 60 tablet 3  . gabapentin (NEURONTIN) 600 MG tablet Take 600 mg by mouth 2 (two) times daily.    Marland Kitchen gemfibrozil (LOPID) 600 MG tablet Take 600 mg by mouth 2 (two) times daily before a meal.    . levothyroxine (SYNTHROID, LEVOTHROID) 100 MCG tablet Take 100 mcg by mouth daily before breakfast.    . lisinopril (PRINIVIL,ZESTRIL) 20 MG tablet Take 20 mg by mouth daily.    . Multiple Vitamins-Minerals (MULTIVITAMIN) tablet Take 1 tablet by mouth daily. 90 tablet 0  . nitroGLYCERIN (NITROSTAT) 0.4 MG SL tablet Place 0.4 mg under the tongue every 5 (five) minutes as needed for chest pain.    Marland Kitchen omeprazole (PRILOSEC) 20 MG capsule Take 20 mg by mouth daily.     . pravastatin (PRAVACHOL) 40 MG  tablet Take 40 mg by mouth at bedtime.    Marland Kitchen venlafaxine XR (EFFEXOR-XR) 75 MG 24 hr capsule Take 225 mg by mouth daily.     Marland Kitchen apixaban (ELIQUIS) 5 MG TABS tablet Take 1 tablet (5 mg total) by mouth 2 (two) times daily. (Patient not taking: Reported on 06/08/2018) 60 tablet 0   No current facility-administered medications for this visit.     Past Medical History:  Diagnosis Date  . Anemia   . Atrial fibrillation (HCC)   . COPD (chronic obstructive pulmonary disease) (HCC)   . GERD (gastroesophageal reflux disease)   . History of blood clots   . Hypertension   . Personal history of tobacco use, presenting hazards to health 07/01/2015  . Renal disorder     Past Surgical History:  Procedure Laterality Date  . ABDOMINAL HYSTERECTOMY    . CARDIAC SURGERY    . CHOLECYSTECTOMY    . COLONOSCOPY WITH PROPOFOL Bilateral 08/01/2015   Procedure: COLONOSCOPY WITH PROPOFOL;  Surgeon: Scot Jun, MD;  Location: [facility] ENDOSCOPY;  Service: Endoscopy;  Laterality: Bilateral;  . ENDOVASCULAR REPAIR/STENT GRAFT N/A 02/21/2018   Procedure: ENDOVASCULAR REPAIR/STENT GRAFT;  Surgeon: Renford Dills, MD;  Location: [facility] INVASIVE CV LAB;  Service: Cardiovascular;  Laterality: N/A;  . ESOPHAGOGASTRODUODENOSCOPY N/A 07/31/2015   Procedure: ESOPHAGOGASTRODUODENOSCOPY (  EGD);  Surgeon: Scot Jun, MD;  Location: [facility] ENDOSCOPY;  Service: Endoscopy;  Laterality: N/A;    Social History Social History   Tobacco Use  . Smoking status: Current Every Day Smoker    Packs/day: 1.00    Years: 60.00    Pack years: 60.00  . Smokeless tobacco: Never Used  Substance Use Topics  . Alcohol use: Not on file  . Drug use: Not on file     Family History No bleeding or clotting disorders  Allergies  Allergen Reactions  . Iodinated Diagnostic Agents Hives, Itching and Rash    She states she has to be premedicated. SPM  . Sulfa Antibiotics Rash     REVIEW OF SYSTEMS (Negative unless  checked)  Constitutional: [] Weight loss  [] Fever  [] Chills Cardiac: [] Chest pain   [] Chest pressure   [] Palpitations   [] Shortness of breath when laying flat   [] Shortness of breath at rest   [] Shortness of breath with exertion. Vascular:  [] Pain in legs with walking   [] Pain in legs at rest   [] Pain in legs when laying flat   [] Claudication   [] Pain in feet when walking  [] Pain in feet at rest  [] Pain in feet when laying flat   [] History of DVT   [] Phlebitis   [] Swelling in legs   [] Varicose veins   [] Non-healing ulcers Pulmonary:   [] Uses home oxygen   [] Productive cough   [] Hemoptysis   [] Wheeze  [x] COPD   [] Asthma Neurologic:  [] Dizziness  [] Blackouts   [] Seizures   [] History of stroke   [] History of TIA  [] Aphasia   [] Temporary blindness   [] Dysphagia   [] Weakness or numbness in arms   [] Weakness or numbness in legs Musculoskeletal:  [] Arthritis   [] Joint swelling   [] Joint pain   [x] Low back pain Hematologic:  [] Easy bruising  [] Easy bleeding   [] Hypercoagulable state   [x] Anemic   Gastrointestinal:  [] Blood in stool   [] Vomiting blood  [x] Gastroesophageal reflux/heartburn   [] Abdominal pain Genitourinary:  [] Chronic kidney disease   [] Difficult urination  [] Frequent urination  [] Burning with urination   [] Hematuria Skin:  [] Rashes   [] Ulcers   [] Wounds Psychological:  [] History of anxiety   []  History of major depression.  Physical Examination  BP (!) 183/74 (BP Location: Right Arm, Patient Position: Sitting)   Pulse 65   Resp 16   Ht 5\' 6"  (1.676 m)   Wt 166 lb 3.2 oz (75.4 kg)   BMI 26.83 kg/m  Gen:  WD/WN, NAD. Appears younger than stated age Head: [place]/AT, No temporalis wasting. Ear/Nose/Throat: Hearing grossly intact, nares w/o erythema or drainage Eyes: Conjunctiva clear. Sclera non-icteric Neck: Supple.  Trachea midline Pulmonary:  Good air movement, no use of accessory muscles.  Cardiac: RRR, no JVD Vascular:  Vessel Right Left  Radial Palpable Palpable                           PT Palpable Palpable  DP Palpable Palpable   Gastrointestinal: soft, non-tender/non-distended. No guarding/reflex.  Musculoskeletal: M/S 5/5 throughout.  No deformity or atrophy. No edema. Neurologic: Sensation grossly intact in extremities.  Symmetrical.  Speech is fluent.  Psychiatric: Judgment intact, Mood & affect appropriate for pt's clinical situation. Dermatologic: No rashes or ulcers noted.  No cellulitis or open wounds.       Labs No results found for this or any previous visit (from the past 2160 hour(s)).  Radiology No results found.  Assessment/Plan  Hyperlipidemia lipid control important in reducing the progression of atherosclerotic disease. Continue statin therapy   Essential hypertension blood pressure control important in reducing the progression of atherosclerotic disease. On appropriate oral medications.   AAA (abdominal aortic aneurysm) without rupture (HCC) Her aortic duplex today shows a patent stent graft without endoleak and shrinkage of her abdominal aortic aneurysm sac now measuring 4.6 x 4.8 in maximal diameter. Overall doing well.  We can stretch out her follow-up to a 7044-month duplex at this point.  If that looks good, we may go to an annual basis following that.    Festus BarrenJason Davien Malone, MD  06/08/2018 10:14 AM    This note was created with Dragon medical transcription system.  Any errors from dictation are purely unintentional

## 2018-06-08 NOTE — Patient Instructions (Signed)
Abdominal Aortic Aneurysm    An aneurysm is a bulge in one of the blood vessels that carry blood away from the heart (artery). It happens when blood pushes up against a weak or damaged place in the wall of an artery. An abdominal aortic aneurysm happens in the main artery of the body (aorta).  Some aneurysms may not cause problems. If it grows, it can burst or tear, causing bleeding inside the body. This is an emergency. It needs to be treated right away.  What are the causes?  The exact cause of this condition is not known.  What increases the risk?  The following may make you more likely to get this condition:   Being a female who is 60 years of age or older.   Being white (Caucasian).   Using tobacco.   Having a family history of aneurysms.   Having the following conditions:  ? Hardening of the arteries (arteriosclerosis).  ? Inflammation of the walls of an artery (arteritis).  ? Certain genetic conditions.  ? Being very overweight (obesity).  ? An infection in the wall of the aorta (infectious aortitis).  ? High cholesterol.  ? High blood pressure (hypertension).  What are the signs or symptoms?  Symptoms depend on the size of the aneurysm and how fast it is growing. Most grow slowly and do not cause any symptoms. If symptoms do occur, they may include:   Pain in the belly (abdomen), side, or back.   Feeling full after eating only small amounts of food.   Feeling a throbbing lump in the belly.  Symptoms that the aneurysm has burst (ruptured) include:   Sudden, very bad pain in the belly, side, or back.   Feeling sick to your stomach (nauseous).   Throwing up (vomiting).   Feeling light-headed or passing out.  How is this treated?  Treatment for this condition depends on:   The size of the aneurysm.   How fast it is growing.   Your age.   Your risk of having it burst.  If your aneurysm is smaller than 2 inches (5 cm), your doctor may manage it by:   Checking it often to see if it is getting bigger.  You may have an imaging test (ultrasound) to check it every 3-6 months, every year, or every few years.   Giving you medicines to:  ? Control blood pressure.  ? Treat pain.  ? Fight infection.  If your aneurysm is larger than 2 inches (5 cm), you may need surgery to fix it.  Follow these instructions at home:  Lifestyle   Do not use any products that have nicotine or tobacco in them. This includes cigarettes, e-cigarettes, and chewing tobacco. If you need help quitting, ask your doctor.   Get regular exercise. Ask your doctor what types of exercise are best for you.  Eating and drinking   Eat a heart-healthy diet. This includes eating plenty of:  ? Fresh fruits and vegetables.  ? Whole grains.  ? Low-fat (lean) protein.  ? Low-fat dairy products.   Avoid foods that are high in saturated fat and cholesterol. These foods include red meat and some dairy products.   Do not drink alcohol if:  ? Your doctor tells you not to drink.  ? You are pregnant, may be pregnant, or are planning to become pregnant.   If you drink alcohol:  ? Limit how much you use to:   0-1 drink a day for women.     0-2 drinks a day for men.  ? Be aware of how much alcohol is in your drink. In the [place] one drink equals any of these:   One typical bottle of beer (12 oz).   One-half glass of wine (5 oz).   One shot of hard liquor (1 oz).  General instructions   Take over-the-counter and prescription medicines only as told by your doctor.   Keep your blood pressure within normal limits. Ask your doctor what your blood pressure should be.   Have your blood sugar (glucose) level and cholesterol levels checked regularly. Keep your blood sugar level and cholesterol levels within normal limits.   Avoid heavy lifting and activities that take a lot of effort. Ask your doctor what activities are safe for you.   Keep all follow-up visits as told by your doctor. This is important.  ? Talk to your doctor about regular screenings to see if the  aneurysm is getting bigger.  Contact a doctor if you:   Have pain in your belly, side, or back.   Have a throbbing feeling in your belly.   Have a family history of aneurysms.  Get help right away if you:   Have sudden, bad pain in your belly, side, or back.   Feel sick to your stomach.   Throw up.   Have trouble pooping (constipation).   Have trouble peeing (urinating).   Feel light-headed.   Have a fast heart rate when you stand.   Have sweaty skin that is cold to the touch (clammy).   Have shortness of breath.   Have a fever.  These symptoms may be an emergency. Do not wait to see if the symptoms will go away. Get medical help right away. Call your local emergency services (911 in the [place] Do not drive yourself to the hospital.  Summary   An aneurysm is a bulge in one of the blood vessels that carry blood away from the heart (artery). Some aneurysms may not cause problems.   You may need to have yours checked often. If it grows, it can burst or tear. This causes bleeding inside the body. It needs to be treated right away.   Follow instructions from your doctor about healthy lifestyle changes.   Keep all follow-up visits as told by your doctor. This is important.  This information is not intended to replace advice given to you by your health care provider. Make sure you discuss any questions you have with your health care provider.  Document Released: 08/13/2012 Document Revised: 11/25/2017 Document Reviewed: 11/25/2017  Elsevier Interactive Patient Education  2019 [employer]

## 2018-06-08 NOTE — Assessment & Plan Note (Signed)
blood pressure control important in reducing the progression of atherosclerotic disease. On appropriate oral medications.  

## 2018-06-08 NOTE — Assessment & Plan Note (Signed)
Her aortic duplex today shows a patent stent graft without endoleak and shrinkage of her abdominal aortic aneurysm sac now measuring 4.6 x 4.8 in maximal diameter. Overall doing well.  We can stretch out her follow-up to a 64-month duplex at this point.  If that looks good, we may go to an annual basis following that.

## 2018-12-11 ENCOUNTER — Ambulatory Visit (INDEPENDENT_AMBULATORY_CARE_PROVIDER_SITE_OTHER): Payer: Medicare HMO | Admitting: Vascular Surgery

## 2018-12-11 ENCOUNTER — Other Ambulatory Visit (INDEPENDENT_AMBULATORY_CARE_PROVIDER_SITE_OTHER): Payer: Medicare HMO

## 2019-01-05 ENCOUNTER — Encounter: Payer: Self-pay | Admitting: Emergency Medicine

## 2019-01-05 ENCOUNTER — Other Ambulatory Visit: Payer: Self-pay

## 2019-01-05 ENCOUNTER — Inpatient Hospital Stay
Admission: EM | Admit: 2019-01-05 | Discharge: 2019-01-06 | DRG: 190 | Disposition: A | Discharge status: Home / Self Care | Payer: MEDICARE | Attending: Internal Medicine | Admitting: Internal Medicine

## 2019-01-05 ENCOUNTER — Emergency Department: Payer: MEDICARE

## 2019-01-05 DIAGNOSIS — J441 Chronic obstructive pulmonary disease with (acute) exacerbation: Secondary | ICD-10-CM | POA: Diagnosis not present

## 2019-01-05 DIAGNOSIS — I10 Essential (primary) hypertension: Secondary | ICD-10-CM | POA: Diagnosis not present

## 2019-01-05 DIAGNOSIS — Z86718 Personal history of other venous thrombosis and embolism: Secondary | ICD-10-CM | POA: Diagnosis not present

## 2019-01-05 DIAGNOSIS — Z87891 Personal history of nicotine dependence: Secondary | ICD-10-CM | POA: Diagnosis not present

## 2019-01-05 DIAGNOSIS — K219 Gastro-esophageal reflux disease without esophagitis: Secondary | ICD-10-CM | POA: Diagnosis present

## 2019-01-05 DIAGNOSIS — I4891 Unspecified atrial fibrillation: Secondary | ICD-10-CM | POA: Diagnosis present

## 2019-01-05 DIAGNOSIS — J9621 Acute and chronic respiratory failure with hypoxia: Secondary | ICD-10-CM | POA: Diagnosis present

## 2019-01-05 DIAGNOSIS — Z20828 Contact with and (suspected) exposure to other viral communicable diseases: Secondary | ICD-10-CM | POA: Diagnosis present

## 2019-01-05 DIAGNOSIS — Z79899 Other long term (current) drug therapy: Secondary | ICD-10-CM

## 2019-01-05 DIAGNOSIS — Z7982 Long term (current) use of aspirin: Secondary | ICD-10-CM

## 2019-01-05 DIAGNOSIS — Z91041 Radiographic dye allergy status: Secondary | ICD-10-CM | POA: Diagnosis not present

## 2019-01-05 DIAGNOSIS — R0603 Acute respiratory distress: Secondary | ICD-10-CM | POA: Diagnosis present

## 2019-01-05 DIAGNOSIS — E039 Hypothyroidism, unspecified: Secondary | ICD-10-CM | POA: Diagnosis not present

## 2019-01-05 DIAGNOSIS — J449 Chronic obstructive pulmonary disease, unspecified: Secondary | ICD-10-CM

## 2019-01-05 DIAGNOSIS — R0902 Hypoxemia: Secondary | ICD-10-CM

## 2019-01-05 DIAGNOSIS — E785 Hyperlipidemia, unspecified: Secondary | ICD-10-CM | POA: Diagnosis present

## 2019-01-05 DIAGNOSIS — Z882 Allergy status to sulfonamides status: Secondary | ICD-10-CM

## 2019-01-05 DIAGNOSIS — Z66 Do not resuscitate: Secondary | ICD-10-CM | POA: Diagnosis not present

## 2019-01-05 NOTE — ED Provider Notes (Signed)
[facility] Emergency Department Provider Note   ____________________________________________   None    (approximate)  I have reviewed the triage vital signs and the nursing notes.   HISTORY  Chief Complaint Fever    HPI Eileen Mack is a 83 y.o. female brought to the ED via EMS from home with a chief complaint of subjective fever, cough, SOB and hypoxia. Patient has COPD; does not wear home oxygen. Felt bad all week with progressive SOB. Denies chest pain, abdominal pain, nausea, vomiting or diarrhea.        Past Medical History:  Diagnosis Date  . Anemia   . Atrial fibrillation ([place])   . COPD (chronic obstructive pulmonary disease) ([place])   . GERD (gastroesophageal reflux disease)   . History of blood clots   . Hypertension   . Personal history of tobacco use, presenting hazards to health 07/01/2015  . Renal disorder     Patient Active Problem List   Diagnosis Date Noted  . Acute on chronic respiratory failure with hypoxemia ([place]) 01/06/2019  . AAA (abdominal aortic aneurysm) ([place]) 02/19/2018  . Essential hypertension 10/10/2017  . Hyperlipidemia 10/10/2017  . AAA (abdominal aortic aneurysm) without rupture ([place]) 10/10/2017  . Pain in both lower extremities 10/10/2017  . Varicose veins of both lower extremities with pain 10/10/2017  . Claudication ([place]) 10/10/2017  . Severe anemia 07/29/2015  . Personal history of tobacco use, presenting hazards to health 07/01/2015    Past Surgical History:  Procedure Laterality Date  . ABDOMINAL HYSTERECTOMY    . CARDIAC SURGERY    . CHOLECYSTECTOMY    . COLONOSCOPY WITH PROPOFOL Bilateral 08/01/2015   Procedure: COLONOSCOPY WITH PROPOFOL;  Surgeon: Manya Silvas, MD;  Location: [facility] ENDOSCOPY;  Service: Endoscopy;  Laterality: Bilateral;  . ENDOVASCULAR REPAIR/STENT GRAFT N/A 02/21/2018   Procedure: ENDOVASCULAR REPAIR/STENT GRAFT;  Surgeon: Katha Cabal, MD;  Location: [facility];  Service: Cardiovascular;  Laterality: N/A;  . ESOPHAGOGASTRODUODENOSCOPY N/A 07/31/2015   Procedure: ESOPHAGOGASTRODUODENOSCOPY (EGD);  Surgeon: Manya Silvas, MD;  Location: [facility] ENDOSCOPY;  Service: Endoscopy;  Laterality: N/A;    Prior to Admission medications   Medication Sig Start Date End Date Taking? Authorizing Provider  aspirin EC 81 MG tablet Take 81 mg by mouth daily.   Yes [provider]  carvedilol (COREG) 25 MG tablet Take 25 mg by mouth 2 (two) times daily with a meal.   Yes [provider]  cloNIDine (CATAPRES) 0.2 MG tablet Take 0.2 mg by mouth 2 (two) times daily.   Yes [provider]  diphenhydramine-acetaminophen (TYLENOL PM) 25-500 MG TABS tablet Take 1 tablet by mouth at bedtime.   Yes [provider]  ferrous sulfate 325 (65 FE) MG tablet Take 1 tablet (325 mg total) by mouth daily with breakfast. 08/01/15  Yes Mody, Sital, MD  gabapentin (NEURONTIN) 600 MG tablet Take 600 mg by mouth 2 (two) times daily.   Yes [provider]  gemfibrozil (LOPID) 600 MG tablet Take 600 mg by mouth 2 (two) times daily before a meal.   Yes [provider]  levothyroxine (SYNTHROID, LEVOTHROID) 100 MCG tablet Take 100 mcg by mouth daily before breakfast.   Yes [provider]  Multiple Vitamins-Minerals (MULTIVITAMIN GUMMIES ADULT PO) Take 1 each by mouth daily.   Yes [provider]  nitroGLYCERIN (NITROSTAT) 0.4 MG SL tablet Place 0.4 mg under the tongue every 5 (five) minutes as needed for chest pain.  Yes [provider]  omeprazole (PRILOSEC) 20 MG capsule Take 20 mg by mouth daily.    Yes [provider]  pravastatin (PRAVACHOL) 40 MG tablet Take 40 mg by mouth at bedtime.   Yes [provider]  Venlafaxine HCl 225 MG TB24 Take 225 mg by mouth daily.    Yes [provider]    Allergies Iodinated diagnostic agents and Sulfa antibiotics  No family history on file.   Social History Social History   Tobacco Use  . Smoking status: Current Every Day Smoker    Packs/day: 1.00    Years: 60.00    Pack years: 60.00  . Smokeless tobacco: Never Used  Substance Use Topics  . Alcohol use: Yes  . Drug use: Never    Review of Systems  Constitutional: Positive for fever. Eyes: No visual changes. ENT: No sore throat. Cardiovascular: Denies chest pain. Respiratory: Positive for cough and shortness of breath. Gastrointestinal: No abdominal pain.  No nausea, no vomiting.  No diarrhea.  No constipation. Genitourinary: Negative for dysuria. Musculoskeletal: Negative for back pain. Skin: Negative for rash. Neurological: Negative for headaches, focal weakness or numbness.   ____________________________________________   PHYSICAL EXAM:  VITAL SIGNS: ED Triage Vitals  Enc Vitals Group     BP 01/05/19 2329 (!) 194/90     Pulse Rate 01/05/19 2329 (!) 115     Resp 01/05/19 2329 (!) 25     Temp 01/05/19 2329 98.5 F (36.9 C)     Temp Source 01/05/19 2329 Oral     SpO2 01/05/19 2329 100 %     Weight 01/05/19 2326 161 lb (73 kg)     Height 01/05/19 2326 5\' 6"  (1.676 m)     Head Circumference --      Peak Flow --      Pain Score 01/05/19 2325 0     Pain Loc --      Pain Edu? --      Excl. in GC? --     Constitutional: Alert and oriented. Well appearing and in mild acute distress. Eyes: Conjunctivae are normal. PERRL. EOMI. Head: Atraumatic. Nose: No congestion/rhinnorhea. Mouth/Throat: Mucous membranes are moist.  Oropharynx non-erythematous. Neck: No stridor.   Cardiovascular: Normal rate, regular rhythm. Grossly normal heart sounds.  Good peripheral circulation. Respiratory: Normal respiratory effort.  No retractions. Lungs with scattered wheezing. Gastrointestinal: Soft and nontender. No distention. No abdominal bruits. No CVA tenderness. Musculoskeletal: No lower extremity tenderness nor edema.  No joint effusions. Neurologic:  Normal speech  and language. No gross focal neurologic deficits are appreciated.  Skin:  Skin is warm, dry and intact. No rash noted. Psychiatric: Mood and affect are normal. Speech and behavior are normal.  ____________________________________________   LABS (all labs ordered are listed, but only abnormal results are displayed)  Labs Reviewed  CBC WITH DIFFERENTIAL/PLATELET - Abnormal; Notable for the following components:      Result Value   RBC 3.64 (*)    Hemoglobin 11.1 (*)    HCT 33.9 (*)    All other components within normal limits  COMPREHENSIVE METABOLIC PANEL - Abnormal; Notable for the following components:   Glucose, Bld 137 (*)    Creatinine, Ser 1.06 (*)    GFR calc non Af Amer 50 (*)    GFR calc Af Amer 57 (*)    All other components within normal limits  URINALYSIS, COMPLETE (UACMP) WITH MICROSCOPIC - Abnormal; Notable for the following components:   Color, Urine STRAW (*)  APPearance CLEAR (*)    All other components within normal limits  TROPONIN I (HIGH SENSITIVITY) - Abnormal; Notable for the following components:   Troponin I (High Sensitivity) 26 (*)    All other components within normal limits  TROPONIN I (HIGH SENSITIVITY) - Abnormal; Notable for the following components:   Troponin I (High Sensitivity) 94 (*)    All other components within normal limits  SARS CORONAVIRUS 2 (HOSPITAL ORDER, PERFORMED IN [place] [facility])  CULTURE, BLOOD (ROUTINE X 2)  CULTURE, BLOOD (ROUTINE X 2)  URINE CULTURE  LACTIC ACID, PLASMA  LACTIC ACID, PLASMA   ____________________________________________  EKG  ED ECG REPORT I, Zameer Borman J, the attending physician, personally viewed and interpreted this ECG.   Date: 01/06/2019  EKG Time: 2327  Rate: 115  Rhythm: sinus tachycardia  Axis: LAD  Intervals:left bundle branch block  ST&T Change: Nonspecific ____________________________________________  RADIOLOGY  ED MD interpretation:  No pneumonia  Official radiology  report(s): Dg Chest Port 1 View  Result Date: 01/06/2019 CLINICAL DATA:  Fever, cough EXAM: PORTABLE CHEST 1 VIEW COMPARISON:  04/01/2016 FINDINGS: There is hyperinflation of the lungs compatible with COPD. Heart is borderline in size. Diffuse interstitial prominence likely reflects chronic interstitial lung disease. No confluent opacities or effusions. No acute bony abnormality. IMPRESSION: COPD. Interstitial prominence likely reflects chronic interstitial lung disease. No acute cardiopulmonary disease. Electronically Signed   By: Charlett NoseKevin  Dover M.D.   On: 01/06/2019 00:12    ____________________________________________   PROCEDURES  Procedure(s) performed (including Critical Care):  Procedures  CRITICAL CARE Performed by: Irean HongSUNG,Haasini Patnaude J   Total critical care time: 30 minutes  Critical care time was exclusive of separately billable procedures and treating other patients.  Critical care was necessary to treat or prevent imminent or life-threatening deterioration.  Critical care was time spent personally by me on the following activities: development of treatment plan with patient and/or surrogate as well as nursing, discussions with consultants, evaluation of patient's response to treatment, examination of patient, obtaining history from patient or surrogate, ordering and performing treatments and interventions, ordering and review of laboratory studies, ordering and review of radiographic studies, pulse oximetry and re-evaluation of patient's condition. ____________________________________________   INITIAL IMPRESSION / ASSESSMENT AND PLAN / ED COURSE  As part of my medical decision making, I reviewed the following data within the electronic MEDICAL RECORD NUMBER Nursing notes reviewed and incorporated, Labs reviewed, EKG interpreted, Old chart reviewed, Radiograph reviewed, Discussed with admitting physician and Notes from prior ED visits     Eileen Mack was evaluated in Emergency  Department on 01/06/2019 for the symptoms described in the history of present illness. She was evaluated in the context of the global COVID-19 pandemic, which necessitated consideration that the patient might be at risk for infection with the SARS-CoV-2 virus that causes COVID-19. Institutional protocols and algorithms that pertain to the evaluation of patients at risk for COVID-19 are in a state of rapid change based on information released by regulatory bodies including the CDC and federal and state organizations. These policies and algorithms were followed during the patient's care in the ED.   83 year old female with COPD who presents with fever, cough and SOB. Differential includes, but is not limited to, viral syndrome, bronchitis including COPD exacerbation, pneumonia, reactive airway disease including asthma, CHF including exacerbation with or without pulmonary/interstitial edema, pneumothorax, ACS, thoracic trauma, and pulmonary embolism.  Patient feels better already on 4L supplemental O2. Will obtain basic labs, Covid swab,  CXR. Administer IV Solumedrol and duonebs. Anticipate hospitalization.      ____________________________________________   FINAL CLINICAL IMPRESSION(S) / ED DIAGNOSES  Final diagnoses:  Respiratory distress  Hypoxia  Chronic obstructive pulmonary disease, unspecified COPD type [facility](HCC)     ED Discharge Orders    None       Note:  This document was prepared using Dragon voice recognition software and may include unintentional dictation errors.   Irean HongSung, Myley Bahner J, MD 01/06/19 [phone]

## 2019-01-05 NOTE — ED Triage Notes (Signed)
Pt arrives via [facility] with c/o fever, cough, SOB, and low saturation. Pt was 83% RA for [facility] and is now at 2L [place] with saturation at 100%.

## 2019-01-06 ENCOUNTER — Other Ambulatory Visit: Payer: Self-pay

## 2019-01-06 DIAGNOSIS — Z66 Do not resuscitate: Secondary | ICD-10-CM | POA: Diagnosis not present

## 2019-01-06 DIAGNOSIS — Z20828 Contact with and (suspected) exposure to other viral communicable diseases: Secondary | ICD-10-CM | POA: Diagnosis not present

## 2019-01-06 DIAGNOSIS — Z882 Allergy status to sulfonamides status: Secondary | ICD-10-CM | POA: Diagnosis not present

## 2019-01-06 DIAGNOSIS — E785 Hyperlipidemia, unspecified: Secondary | ICD-10-CM | POA: Diagnosis not present

## 2019-01-06 DIAGNOSIS — J9621 Acute and chronic respiratory failure with hypoxia: Secondary | ICD-10-CM | POA: Diagnosis not present

## 2019-01-06 DIAGNOSIS — I10 Essential (primary) hypertension: Secondary | ICD-10-CM | POA: Diagnosis not present

## 2019-01-06 DIAGNOSIS — Z79899 Other long term (current) drug therapy: Secondary | ICD-10-CM | POA: Diagnosis not present

## 2019-01-06 DIAGNOSIS — J441 Chronic obstructive pulmonary disease with (acute) exacerbation: Secondary | ICD-10-CM | POA: Diagnosis present

## 2019-01-06 DIAGNOSIS — Z86718 Personal history of other venous thrombosis and embolism: Secondary | ICD-10-CM | POA: Diagnosis not present

## 2019-01-06 DIAGNOSIS — E039 Hypothyroidism, unspecified: Secondary | ICD-10-CM | POA: Diagnosis not present

## 2019-01-06 DIAGNOSIS — J449 Chronic obstructive pulmonary disease, unspecified: Secondary | ICD-10-CM | POA: Diagnosis present

## 2019-01-06 DIAGNOSIS — K219 Gastro-esophageal reflux disease without esophagitis: Secondary | ICD-10-CM | POA: Diagnosis not present

## 2019-01-06 DIAGNOSIS — Z7982 Long term (current) use of aspirin: Secondary | ICD-10-CM | POA: Diagnosis not present

## 2019-01-06 DIAGNOSIS — Z87891 Personal history of nicotine dependence: Secondary | ICD-10-CM | POA: Diagnosis not present

## 2019-01-06 DIAGNOSIS — R0603 Acute respiratory distress: Secondary | ICD-10-CM | POA: Diagnosis present

## 2019-01-06 DIAGNOSIS — I4891 Unspecified atrial fibrillation: Secondary | ICD-10-CM | POA: Diagnosis not present

## 2019-01-06 DIAGNOSIS — Z91041 Radiographic dye allergy status: Secondary | ICD-10-CM | POA: Diagnosis not present

## 2019-01-06 LAB — CBC WITH DIFFERENTIAL/PLATELET
Abs Immature Granulocytes: 0.02 10*3/uL (ref 0.00–0.07)
Basophils Absolute: 0.1 10*3/uL (ref 0.0–0.1)
Basophils Relative: 1 %
Eosinophils Absolute: 0.3 10*3/uL (ref 0.0–0.5)
Eosinophils Relative: 4 %
HCT: 33.9 % — ABNORMAL LOW (ref 36.0–46.0)
Hemoglobin: 11.1 g/dL — ABNORMAL LOW (ref 12.0–15.0)
Immature Granulocytes: 0 %
Lymphocytes Relative: 22 %
Lymphs Abs: 1.4 10*3/uL (ref 0.7–4.0)
MCH: 30.5 pg (ref 26.0–34.0)
MCHC: 32.7 g/dL (ref 30.0–36.0)
MCV: 93.1 fL (ref 80.0–100.0)
Monocytes Absolute: 0.5 10*3/uL (ref 0.1–1.0)
Monocytes Relative: 8 %
Neutro Abs: 4.3 10*3/uL (ref 1.7–7.7)
Neutrophils Relative %: 65 %
Platelets: 331 10*3/uL (ref 150–400)
RBC: 3.64 MIL/uL — ABNORMAL LOW (ref 3.87–5.11)
RDW: 13.9 % (ref 11.5–15.5)
WBC: 6.6 10*3/uL (ref 4.0–10.5)
nRBC: 0 % (ref 0.0–0.2)

## 2019-01-06 LAB — TROPONIN I (HIGH SENSITIVITY)
Troponin I (High Sensitivity): 127 ng/L (ref <18)
Troponin I (High Sensitivity): 136 ng/L (ref <18)
Troponin I (High Sensitivity): 26 ng/L — ABNORMAL HIGH (ref <18)
Troponin I (High Sensitivity): 94 ng/L — ABNORMAL HIGH (ref <18)

## 2019-01-06 LAB — URINALYSIS, COMPLETE (UACMP) WITH MICROSCOPIC
Bacteria, UA: NONE SEEN
Bilirubin Urine: NEGATIVE
Glucose, UA: NEGATIVE mg/dL
Hgb urine dipstick: NEGATIVE
Ketones, ur: NEGATIVE mg/dL
Leukocytes,Ua: NEGATIVE
Nitrite: NEGATIVE
Protein, ur: NEGATIVE mg/dL
Specific Gravity, Urine: 1.008 (ref 1.005–1.030)
pH: 6 (ref 5.0–8.0)

## 2019-01-06 LAB — COMPREHENSIVE METABOLIC PANEL
ALT: 11 U/L (ref 0–44)
AST: 18 U/L (ref 15–41)
Albumin: 4.1 g/dL (ref 3.5–5.0)
Alkaline Phosphatase: 100 U/L (ref 38–126)
Anion gap: 12 (ref 5–15)
BUN: 18 mg/dL (ref 8–23)
CO2: 25 mmol/L (ref 22–32)
Calcium: 9.7 mg/dL (ref 8.9–10.3)
Chloride: 103 mmol/L (ref 98–111)
Creatinine, Ser: 1.06 mg/dL — ABNORMAL HIGH (ref 0.44–1.00)
GFR calc Af Amer: 57 mL/min — ABNORMAL LOW (ref >60)
GFR calc non Af Amer: 50 mL/min — ABNORMAL LOW (ref >60)
Glucose, Bld: 137 mg/dL — ABNORMAL HIGH (ref 70–99)
Potassium: 3.8 mmol/L (ref 3.5–5.1)
Sodium: 140 mmol/L (ref 135–145)
Total Bilirubin: 0.3 mg/dL (ref 0.3–1.2)
Total Protein: 7.6 g/dL (ref 6.5–8.1)

## 2019-01-06 LAB — TSH: TSH: 0.397 u[IU]/mL (ref 0.350–4.500)

## 2019-01-06 LAB — SARS CORONAVIRUS 2 BY RT PCR (HOSPITAL ORDER, PERFORMED IN ~~LOC~~ HOSPITAL LAB): SARS Coronavirus 2: NEGATIVE

## 2019-01-06 LAB — LACTIC ACID, PLASMA
Lactic Acid, Venous: 0.6 mmol/L (ref 0.5–1.9)
Lactic Acid, Venous: 0.7 mmol/L (ref 0.5–1.9)

## 2019-01-06 LAB — HEMOGLOBIN A1C
Hgb A1c MFr Bld: 6.3 % — ABNORMAL HIGH (ref 4.8–5.6)
Mean Plasma Glucose: 134.11 mg/dL

## 2019-01-06 MED ORDER — ACETAMINOPHEN 650 MG RE SUPP: 650.0000 mg | Freq: Four times a day (QID) | RECTAL | Status: DC | PRN

## 2019-01-06 MED ORDER — VENLAFAXINE HCL ER 75 MG PO CP24
225.0000 mg | ORAL_CAPSULE | Freq: Every day | ORAL | Status: DC
  Administered 2019-01-06: 11:00:00 225 mg via ORAL
  Filled 2019-01-06: qty 3

## 2019-01-06 MED ORDER — GABAPENTIN 600 MG PO TABS
600.0000 mg | ORAL_TABLET | Freq: Two times a day (BID) | ORAL | Status: DC
  Administered 2019-01-06: 11:00:00 600 mg via ORAL
  Filled 2019-01-06: qty 1

## 2019-01-06 MED ORDER — LEVOTHYROXINE SODIUM 100 MCG PO TABS
100.0000 ug | ORAL_TABLET | Freq: Every day | ORAL | Status: DC
  Administered 2019-01-06: 100 ug via ORAL
  Filled 2019-01-06: qty 1

## 2019-01-06 MED ORDER — PANTOPRAZOLE SODIUM 40 MG PO TBEC
40.0000 mg | DELAYED_RELEASE_TABLET | Freq: Every day | ORAL | Status: DC
  Administered 2019-01-06: 40 mg via ORAL
  Filled 2019-01-06: qty 1

## 2019-01-06 MED ORDER — ALBUTEROL SULFATE (2.5 MG/3ML) 0.083% IN NEBU
2.5000 mg | INHALATION_SOLUTION | RESPIRATORY_TRACT | Status: DC
  Filled 2019-01-06: qty 3

## 2019-01-06 MED ORDER — ACETAMINOPHEN 325 MG PO TABS: 650.0000 mg | ORAL_TABLET | Freq: Four times a day (QID) | ORAL | Status: DC | PRN

## 2019-01-06 MED ORDER — ENOXAPARIN SODIUM 40 MG/0.4ML ~~LOC~~ SOLN
40.0000 mg | SUBCUTANEOUS | Status: DC
  Filled 2019-01-06: qty 0.4

## 2019-01-06 MED ORDER — PRAVASTATIN SODIUM 20 MG PO TABS: 40.0000 mg | ORAL_TABLET | Freq: Every day | ORAL | Status: DC

## 2019-01-06 MED ORDER — NITROGLYCERIN 0.4 MG SL SUBL: 0.4000 mg | SUBLINGUAL_TABLET | SUBLINGUAL | Status: DC | PRN

## 2019-01-06 MED ORDER — IPRATROPIUM-ALBUTEROL 0.5-2.5 (3) MG/3ML IN SOLN
3.0000 mL | Freq: Once | RESPIRATORY_TRACT | Status: AC
  Administered 2019-01-06: 3 mL via RESPIRATORY_TRACT
  Filled 2019-01-06: qty 3

## 2019-01-06 MED ORDER — DOCUSATE SODIUM 100 MG PO CAPS
100.0000 mg | ORAL_CAPSULE | Freq: Two times a day (BID) | ORAL | Status: DC
  Filled 2019-01-06: qty 1

## 2019-01-06 MED ORDER — AMLODIPINE BESYLATE 10 MG PO TABS
10.0000 mg | ORAL_TABLET | Freq: Every day | ORAL | Status: DC
  Administered 2019-01-06: 10 mg via ORAL
  Filled 2019-01-06: qty 1

## 2019-01-06 MED ORDER — ASPIRIN EC 81 MG PO TBEC
81.0000 mg | DELAYED_RELEASE_TABLET | Freq: Every day | ORAL | Status: DC
  Administered 2019-01-06: 11:00:00 81 mg via ORAL
  Filled 2019-01-06: qty 1

## 2019-01-06 MED ORDER — DIPHENHYDRAMINE HCL 25 MG PO CAPS: 25.0000 mg | ORAL_CAPSULE | Freq: Every day | ORAL | Status: DC

## 2019-01-06 MED ORDER — METHYLPREDNISOLONE SODIUM SUCC 125 MG IJ SOLR
125.0000 mg | Freq: Once | INTRAMUSCULAR | Status: AC
  Administered 2019-01-06: 125 mg via INTRAVENOUS
  Filled 2019-01-06: qty 2

## 2019-01-06 MED ORDER — TIOTROPIUM BROMIDE MONOHYDRATE 18 MCG IN CAPS
18.0000 ug | ORAL_CAPSULE | Freq: Every day | RESPIRATORY_TRACT | Status: DC
  Filled 2019-01-06: qty 5

## 2019-01-06 MED ORDER — ACETAMINOPHEN 500 MG PO TABS: 500.0000 mg | ORAL_TABLET | Freq: Every day | ORAL | Status: DC

## 2019-01-06 MED ORDER — FERROUS SULFATE 325 (65 FE) MG PO TABS
325.0000 mg | ORAL_TABLET | Freq: Every day | ORAL | Status: DC
  Administered 2019-01-06: 325 mg via ORAL
  Filled 2019-01-06: qty 1

## 2019-01-06 MED ORDER — GEMFIBROZIL 600 MG PO TABS
600.0000 mg | ORAL_TABLET | Freq: Two times a day (BID) | ORAL | Status: DC
  Filled 2019-01-06 (×2): qty 1

## 2019-01-06 MED ORDER — PREDNISONE 50 MG PO TABS
50.0000 mg | ORAL_TABLET | Freq: Every day | ORAL | Status: DC
  Administered 2019-01-06: 50 mg via ORAL
  Filled 2019-01-06: qty 1

## 2019-01-06 MED ORDER — AZITHROMYCIN 500 MG PO TABS
500.0000 mg | ORAL_TABLET | Freq: Every day | ORAL | Status: AC
  Administered 2019-01-06: 500 mg via ORAL
  Filled 2019-01-06: qty 1

## 2019-01-06 MED ORDER — OXYCODONE-ACETAMINOPHEN 5-325 MG PO TABS
1.0000 | ORAL_TABLET | Freq: Four times a day (QID) | ORAL | Status: DC | PRN
  Administered 2019-01-06: 1 via ORAL
  Filled 2019-01-06: qty 1

## 2019-01-06 MED ORDER — CARVEDILOL 25 MG PO TABS
25.0000 mg | ORAL_TABLET | Freq: Two times a day (BID) | ORAL | Status: DC
  Administered 2019-01-06: 25 mg via ORAL
  Filled 2019-01-06: qty 1

## 2019-01-06 MED ORDER — ONDANSETRON HCL 4 MG PO TABS: 4.0000 mg | ORAL_TABLET | Freq: Four times a day (QID) | ORAL | Status: DC | PRN

## 2019-01-06 MED ORDER — CLONIDINE HCL 0.1 MG PO TABS: 0.2000 mg | ORAL_TABLET | Freq: Two times a day (BID) | ORAL | Status: DC

## 2019-01-06 MED ORDER — ONDANSETRON HCL 4 MG/2ML IJ SOLN: 4.0000 mg | Freq: Four times a day (QID) | INTRAMUSCULAR | Status: DC | PRN

## 2019-01-06 MED ORDER — ALBUTEROL SULFATE HFA 108 (90 BASE) MCG/ACT IN AERS: 2.0000 | INHALATION_SPRAY | Freq: Four times a day (QID) | RESPIRATORY_TRACT | 1 refills | Status: DC | PRN

## 2019-01-06 MED ORDER — AZITHROMYCIN 500 MG PO TABS: 250.0000 mg | ORAL_TABLET | Freq: Every day | ORAL | Status: DC

## 2019-01-06 MED ORDER — DIPHENHYDRAMINE-APAP (SLEEP) 25-500 MG PO TABS: 1.0000 | ORAL_TABLET | Freq: Every day | ORAL | Status: DC

## 2019-01-06 MED ORDER — AZITHROMYCIN 250 MG PO TABS: 250.0000 mg | ORAL_TABLET | Freq: Every day | ORAL | 0 refills | Status: AC

## 2019-01-06 NOTE — ED Notes (Signed)
MD Marcille Blanco aware of pt's troponin level.

## 2019-01-06 NOTE — Progress Notes (Signed)
CRITICAL VALUE ALERT  Critical Value: Troponin of 136  Date & Time Notied:  0728  Provider Notified: Marcille Blanco  Orders Received/Actions taken: EKG ordered. Cardiology consult ordered. Continue to monitor

## 2019-01-06 NOTE — ED Notes (Signed)
Rebecca RN, aware of bed assigned 

## 2019-01-06 NOTE — ED Notes (Signed)
Pt sleeping at this time in room. Rise and fall of chest noted.

## 2019-01-06 NOTE — Discharge Summary (Signed)
[facility] [place] [facility]   PATIENT NAME: Eileen Mack    MR#:  [id number]  DATE OF BIRTH:  April 21, 1936  DATE OF ADMISSION:  01/05/2019 ADMITTING PHYSICIAN: Arnaldo Natal, MD  DATE OF DISCHARGE: 01/06/2019   PRIMARY CARE PHYSICIAN: Patient, No Pcp Per    ADMISSION DIAGNOSIS:  Respiratory distress [R06.03] Hypoxia [R09.02] Chronic obstructive pulmonary disease, unspecified COPD type (HCC) [J44.9]  DISCHARGE DIAGNOSIS:  Active Problems:   Acute on chronic respiratory failure with hypoxemia (HCC)   COPD exacerbation (HCC)   SECONDARY DIAGNOSIS:   Past Medical History:  Diagnosis Date  . Anemia   . Atrial fibrillation (HCC)   . COPD (chronic obstructive pulmonary disease) (HCC)   . GERD (gastroesophageal reflux disease)   . History of blood clots   . Hypertension   . Personal history of tobacco use, presenting hazards to health 07/01/2015  . Renal disorder     HOSPITAL COURSE:   Came with respi distress, after getting IV steroids and nebs- she had improved and was on room air. She was comfortble and walked around nursing station multiple times.  She had some sinus tachycardia on presentation with slight elevation on troponin. She had hx of A fib and had been seen by Dr. Lady Gary in clinic - NM stress was negative in Feb 2020.  I have called Dr. Juliann Pares and as per his communication with me, pt does not need any meds changed . She was advissed to take eliquis but she had not taken it in past, I discussed again, she is very adament about not taking it.  DISCHARGE CONDITIONS:   Stable.  CONSULTS OBTAINED:    DRUG ALLERGIES:   Allergies  Allergen Reactions  . Iodinated Diagnostic Agents Hives, Itching and Rash    She states she has to be premedicated. SPM  . Sulfa Antibiotics Rash    DISCHARGE MEDICATIONS:   Allergies as of 01/06/2019      Reactions   Iodinated Diagnostic Agents Hives, Itching, Rash   She states she has to be  premedicated. SPM   Sulfa Antibiotics Rash      Medication List    TAKE these medications   albuterol 108 (90 Base) MCG/ACT inhaler Commonly known as: VENTOLIN HFA Inhale 2 puffs into the lungs every 6 (six) hours as needed for wheezing or shortness of breath.   aspirin EC 81 MG tablet Take 81 mg by mouth daily.   azithromycin 250 MG tablet Commonly known as: ZITHROMAX Take 1 tablet (250 mg total) by mouth daily for 3 days. Start taking on: January 07, 2019   carvedilol 25 MG tablet Commonly known as: COREG Take 25 mg by mouth 2 (two) times daily with a meal.   cloNIDine 0.2 MG tablet Commonly known as: CATAPRES Take 0.2 mg by mouth 2 (two) times daily.   diphenhydramine-acetaminophen 25-500 MG Tabs tablet Commonly known as: TYLENOL PM Take 1 tablet by mouth at bedtime.   ferrous sulfate 325 (65 FE) MG tablet Take 1 tablet (325 mg total) by mouth daily with breakfast.   gabapentin 600 MG tablet Commonly known as: NEURONTIN Take 600 mg by mouth 2 (two) times daily.   gemfibrozil 600 MG tablet Commonly known as: LOPID Take 600 mg by mouth 2 (two) times daily before a meal.   levothyroxine 100 MCG tablet Commonly known as: SYNTHROID Take 100 mcg by mouth daily before breakfast.   MULTIVITAMIN GUMMIES ADULT PO Take 1 each by mouth daily.  nitroGLYCERIN 0.4 MG SL tablet Commonly known as: NITROSTAT Place 0.4 mg under the tongue every 5 (five) minutes as needed for chest pain.   omeprazole 20 MG capsule Commonly known as: PRILOSEC Take 20 mg by mouth daily.   pravastatin 40 MG tablet Commonly known as: PRAVACHOL Take 40 mg by mouth at bedtime.   Venlafaxine HCl 225 MG Tb24 Take 225 mg by mouth daily.        DISCHARGE INSTRUCTIONS:    Follow with PMD and with Dr. Ubaldo Glassing in 1-2 weeks.  If you experience worsening of your admission symptoms, develop shortness of breath, life threatening emergency, suicidal or homicidal thoughts you must seek medical  attention immediately by calling 911 or calling your MD immediately  if symptoms less severe.  You Must read complete instructions/literature along with all the possible adverse reactions/side effects for all the Medicines you take and that have been prescribed to you. Take any new Medicines after you have completely understood and accept all the possible adverse reactions/side effects.   Please note  You were cared for by a hospitalist during your hospital stay. If you have any questions about your discharge medications or the care you received while you were in the hospital after you are discharged, you can call the unit and asked to speak with the hospitalist on call if the hospitalist that took care of you is not available. Once you are discharged, your primary care physician will handle any further medical issues. Please note that NO REFILLS for any discharge medications will be authorized once you are discharged, as it is imperative that you return to your primary care physician (or establish a relationship with a primary care physician if you do not have one) for your aftercare needs so that they can reassess your need for medications and monitor your lab values.    Today   CHIEF COMPLAINT:   Chief Complaint  Patient presents with  . Fever    HISTORY OF PRESENT ILLNESS:  Flonnie Mack  is a 83 y.o. female with a known history of COPD, hypertension and hypothyroidism presents to the emergency department via EMS due to fever.  The patient also had oxygen saturations of 83% on room air upon arrival.  She was placed on 3 L of oxygen via nasal cannula and given Solu-Medrol in addition to DuoNeb treatment.  The patient does not usually wear oxygen at home.  Thus, due to need for supplemental oxygen the emergency department staff call hospitalist service for admission.   VITAL SIGNS:  Blood pressure (!) 141/98, pulse 71, temperature 98.3 F (36.8 C), temperature source Oral, resp. rate  18, height 5\' 7"  (1.702 m), weight 72.5 kg, SpO2 98 %.  I/O:    Intake/Output Summary (Last 24 hours) at 01/06/2019 1507 Last data filed at 01/06/2019 1011 Gross per 24 hour  Intake 120 ml  Output -  Net 120 ml    PHYSICAL EXAMINATION:  GENERAL:  83 y.o.-year-old patient lying in the bed with no acute distress.  EYES: Pupils equal, round, reactive to light and accommodation. No scleral icterus. Extraocular muscles intact.  HEENT: Head atraumatic, normocephalic. Oropharynx and nasopharynx clear.  NECK:  Supple, no jugular venous distention. No thyroid enlargement, no tenderness.  LUNGS: Normal breath sounds bilaterally, no wheezing, rales,rhonchi or crepitation. No use of accessory muscles of respiration.  CARDIOVASCULAR: S1, S2 normal. No murmurs, rubs, or gallops.  ABDOMEN: Soft, non-tender, non-distended. Bowel sounds present. No organomegaly or mass.  EXTREMITIES: No pedal  edema, cyanosis, or clubbing.  NEUROLOGIC: Cranial nerves II through XII are intact. Muscle strength 5/5 in all extremities. Sensation intact. Gait not checked.  PSYCHIATRIC: The patient is alert and oriented x 3.  SKIN: No obvious rash, lesion, or ulcer.   DATA REVIEW:   CBC Recent Labs  Lab 01/05/19 2345  WBC 6.6  HGB 11.1*  HCT 33.9*  PLT 331    Chemistries  Recent Labs  Lab 01/05/19 2345  NA 140  K 3.8  CL 103  CO2 25  GLUCOSE 137*  BUN 18  CREATININE 1.06*  CALCIUM 9.7  AST 18  ALT 11  ALKPHOS 100  BILITOT 0.3    Cardiac Enzymes No results for input(s): TROPONINI in the last 168 hours.  Microbiology Results  Results for orders placed or performed during the hospital encounter of 01/05/19  Culture, blood (routine x 2)     Status: None (Preliminary result)   Collection Time: 01/05/19 11:45 PM   Specimen: BLOOD  Result Value Ref Range Status   Specimen Description BLOOD LEFT ANTECUBITAL  Final   Special Requests   Final    BOTTLES DRAWN AEROBIC AND ANAEROBIC Blood Culture adequate  volume   Culture   Final    NO GROWTH < 12 HOURS Performed at [facility], [address] [place], [place] [address]    Report Status PENDING  Incomplete  Culture, blood (routine x 2)     Status: None (Preliminary result)   Collection Time: 01/05/19 11:45 PM   Specimen: BLOOD  Result Value Ref Range Status   Specimen Description BLOOD BLOOD LEFT WRIST  Final   Special Requests   Final    BOTTLES DRAWN AEROBIC AND ANAEROBIC Blood Culture adequate volume   Culture   Final    NO GROWTH < 12 HOURS Performed at [facility], [address] [place], [place] [address]    Report Status PENDING  Incomplete  SARS Coronavirus 2 [facility](Hospital order, [facility]) Nasopharyngeal Nasopharyngeal Swab     Status: None   Collection Time: 01/05/19 11:45 PM   Specimen: Nasopharyngeal Swab  Result Value Ref Range Status   SARS Coronavirus 2 NEGATIVE NEGATIVE Final    Comment: (NOTE) If result is NEGATIVE SARS-CoV-2 target nucleic acids are NOT DETECTED. The SARS-CoV-2 RNA is generally detectable in upper and lower  respiratory specimens during the acute phase of infection. The lowest  concentration of SARS-CoV-2 viral copies this assay can detect is 250  copies / mL. A negative result does not preclude SARS-CoV-2 infection  and should not be used as the sole basis for treatment or other  patient management decisions.  A negative result may occur with  improper specimen collection / handling, submission of specimen other  than nasopharyngeal swab, presence of viral mutation(s) within the  areas targeted by this assay, and inadequate number of viral copies  (<250 copies / mL). A negative result must be combined with clinical  observations, patient history, and epidemiological information. If result is POSITIVE SARS-CoV-2 target nucleic acids are DETECTED. The SARS-CoV-2 RNA is generally detectable in upper and lower  respiratory specimens dur ing the  acute phase of infection.  Positive  results are indicative of active infection with SARS-CoV-2.  Clinical  correlation with patient history and other diagnostic information is  necessary to determine patient infection status.  Positive results do  not rule out bacterial infection or co-infection with other viruses. If result is PRESUMPTIVE POSTIVE SARS-CoV-2  nucleic acids MAY BE PRESENT.   A presumptive positive result was obtained on the submitted specimen  and confirmed on repeat testing.  While 2019 novel coronavirus  (SARS-CoV-2) nucleic acids may be present in the submitted sample  additional confirmatory testing may be necessary for epidemiological  and / or clinical management purposes  to differentiate between  SARS-CoV-2 and other Sarbecovirus currently known to infect humans.  If clinically indicated additional testing with an alternate test  methodology [phone]) is advised. The SARS-CoV-2 RNA is generally  detectable in upper and lower respiratory sp ecimens during the acute  phase of infection. The expected result is Negative. Fact Sheet for Patients:  BoilerBrush.com.cy Fact Sheet for Healthcare Providers: https://pope.com/ This test is not yet approved or cleared by the [place] FDA and has been authorized for detection and/or diagnosis of SARS-CoV-2 by FDA under an Emergency Use Authorization (EUA).  This EUA will remain in effect (meaning this test can be used) for the duration of the COVID-19 declaration under Section 564(b)(1) of the Act, 21 U.S.C. section 360bbb-3(b)(1), unless the authorization is terminated or revoked sooner. Performed at [facility], [address] [place] [place], [place] [address]     RADIOLOGY:  Dg Chest Port 1 View  Result Date: 01/06/2019 CLINICAL DATA:  Fever, cough EXAM: PORTABLE CHEST 1 VIEW COMPARISON:  04/01/2016 FINDINGS: There is hyperinflation of the lungs compatible with  COPD. Heart is borderline in size. Diffuse interstitial prominence likely reflects chronic interstitial lung disease. No confluent opacities or effusions. No acute bony abnormality. IMPRESSION: COPD. Interstitial prominence likely reflects chronic interstitial lung disease. No acute cardiopulmonary disease. Electronically Signed   By: Charlett Nose M.D.   On: 01/06/2019 00:12    EKG:   Orders placed or performed during the hospital encounter of 01/05/19  . EKG 12-Lead  . EKG 12-Lead  . EKG 12-Lead  . EKG 12-Lead  . EKG 12-Lead  . EKG 12-Lead      Management plans discussed with the patient, family and they are in agreement.  CODE STATUS:     Code Status Orders  (From admission, onward)         Start     Ordered   01/06/19 0602  Do not attempt resuscitation (DNR)  Continuous    Question Answer Comment  In the event of cardiac or respiratory ARREST Do not call a "code blue"   In the event of cardiac or respiratory ARREST Do not perform Intubation, CPR, defibrillation or ACLS   In the event of cardiac or respiratory ARREST Use medication by any route, position, wound care, and other measures to relive pain and suffering. May use oxygen, suction and manual treatment of airway obstruction as needed for comfort.      01/06/19 0601        Code Status History    Date Active Date Inactive Code Status Order ID Comments User Context   02/19/2018 2113 02/22/2018 1520 DNR [id number]  Enedina Finner, MD Inpatient   07/29/2015 1141 08/01/2015 1401 Full Code [id number]  Katha Hamming, MD ED   Advance Care Planning Activity    Advance Directive Documentation     Most Recent Value  Type of Advance Directive  Out of facility DNR (pink MOST or yellow form), Living will, Healthcare Power of Attorney  Pre-existing out of facility DNR order (yellow form or pink MOST form)  -  "MOST" Form in Place?  -      TOTAL TIME TAKING CARE OF THIS  PATIENT: 35 minutes.    Altamese DillingVaibhavkumar Brodi Kari M.D on  01/06/2019 at 3:07 PM  Between 7am to 6pm - Pager - [phone]  After 6pm go to [email or web address] - password [email or web address]  [facility]  Office  [phone]  CC: Primary care physician; Patient, No Pcp Per   Note: This dictation was prepared with Dragon dictation along with smaller phrase technology. Any transcriptional errors that result from this process are unintentional.

## 2019-01-06 NOTE — Progress Notes (Signed)
Family Meeting Note  Advance Directive:{yes  Today a meeting took place with the patient.   The following clinical team members were present during this meeting: MD  The following were discussed:Patient's diagnosis:COPD, A fib , Patient's progosis: unabe to determine and Goals for treatment: DNR Additional follow-up to be provided: Cardiology  I have done smoking cessation counseling- time spent for that is 4 min.  Time spent during advance care plan discussion: 20 min  Vaughan Basta, MD

## 2019-01-06 NOTE — Consult Note (Signed)
Reason for Consult: Shortness of breath respiratory failure hypoxemia Referring Physician: Dr. Joycelyn RuaMichael Diamond hospitalist  Eileen Molly MaduroJ Cullers is an 83 y.o. female.  HPI: Is a patient with shortness of breath dyspnea history of COPD hypertension hypothyroidism smoking patient initially presented with severe dyspnea which came on relatively acutely the patient was found to be hypoxic placed on 3 L treated with steroids for COPD and given respiratory treatments patient does not usually wear oxygen at home patient had significant improvement in her symptoms with supplemental oxygen.  She has a history of paroxysmal atrial fibrillation but has refused Eliquis therapy found to have some anemia history of blood clots history of hypertension renal insufficiency denies any discrete chest pain now here for further evaluation  Past Medical History:  Diagnosis Date  . Anemia   . Atrial fibrillation (HCC)   . COPD (chronic obstructive pulmonary disease) (HCC)   . GERD (gastroesophageal reflux disease)   . History of blood clots   . Hypertension   . Personal history of tobacco use, presenting hazards to health 07/01/2015  . Renal disorder     Past Surgical History:  Procedure Laterality Date  . ABDOMINAL HYSTERECTOMY    . CARDIAC SURGERY    . CHOLECYSTECTOMY    . COLONOSCOPY WITH PROPOFOL Bilateral 08/01/2015   Procedure: COLONOSCOPY WITH PROPOFOL;  Surgeon: Scot Junobert T Elliott, MD;  [facility];  Service: Endoscopy;  Laterality: Bilateral;  . ENDOVASCULAR REPAIR/STENT GRAFT N/A 02/21/2018   Procedure: ENDOVASCULAR REPAIR/STENT GRAFT;  Surgeon: Renford DillsSchnier, Gregory G, MD;  Location: [facility] INVASIVE CV LAB;  Service: Cardiovascular;  Laterality: N/A;  . ESOPHAGOGASTRODUODENOSCOPY N/A 07/31/2015   Procedure: ESOPHAGOGASTRODUODENOSCOPY (EGD);  Surgeon: Scot Junobert T Elliott, MD;  [facility];  Service: Endoscopy;  Laterality: N/A;    History reviewed. No pertinent family history.  Social  History:  reports that she has been smoking. She has a 60.00 pack-year smoking history. She has never used smokeless tobacco. She reports current alcohol use. She reports that she does not use drugs.  Allergies:  Allergies  Allergen Reactions  . Iodinated Diagnostic Agents Hives, Itching and Rash    She states she has to be premedicated. SPM  . Sulfa Antibiotics Rash    Medications: I have reviewed the patient's current medications.  Results for orders placed or performed during the hospital encounter of 01/05/19 (from the past 48 hour(s))  Culture, blood (routine x 2)     Status: None (Preliminary result)   Collection Time: 01/05/19 11:45 PM   Specimen: BLOOD  Result Value Ref Range   Specimen Description BLOOD LEFT ANTECUBITAL    Special Requests      BOTTLES DRAWN AEROBIC AND ANAEROBIC Blood Culture adequate volume   Culture      NO GROWTH < 12 HOURS Performed at [place] [facility], [address] [place], [place] [address]    Report Status PENDING   Culture, blood (routine x 2)     Status: None (Preliminary result)   Collection Time: 01/05/19 11:45 PM   Specimen: BLOOD  Result Value Ref Range   Specimen Description BLOOD BLOOD LEFT WRIST    Special Requests      BOTTLES DRAWN AEROBIC AND ANAEROBIC Blood Culture adequate volume   Culture      NO GROWTH < 12 HOURS Performed at [facility], [address] [place], [place] [address]    Report Status PENDING   CBC with Differential     Status: Abnormal   Collection Time: 01/05/19  11:45 PM  Result Value Ref Range   WBC 6.6 4.0 - 10.5 K/uL   RBC 3.64 (L) 3.87 - 5.11 MIL/uL   Hemoglobin 11.1 (L) 12.0 - 15.0 g/dL   HCT 96.233.9 (L) 95.236.0 - 84.146.0 %   MCV 93.1 80.0 - 100.0 fL   MCH 30.5 26.0 - 34.0 pg   MCHC 32.7 30.0 - 36.0 g/dL   RDW 32.413.9 40.111.5 - 02.715.5 %   Platelets 331 150 - 400 K/uL   nRBC 0.0 0.0 - 0.2 %   Neutrophils Relative % 65 %   Neutro Abs 4.3 1.7 - 7.7 K/uL   Lymphocytes Relative 22 %   Lymphs Abs  1.4 0.7 - 4.0 K/uL   Monocytes Relative 8 %   Monocytes Absolute 0.5 0.1 - 1.0 K/uL   Eosinophils Relative 4 %   Eosinophils Absolute 0.3 0.0 - 0.5 K/uL   Basophils Relative 1 %   Basophils Absolute 0.1 0.0 - 0.1 K/uL   Immature Granulocytes 0 %   Abs Immature Granulocytes 0.02 0.00 - 0.07 K/uL    Comment: Performed at [facility], [address] [place], [place] [address]  Comprehensive metabolic panel     Status: Abnormal   Collection Time: 01/05/19 11:45 PM  Result Value Ref Range   Sodium 140 135 - 145 mmol/L   Potassium 3.8 3.5 - 5.1 mmol/L   Chloride 103 98 - 111 mmol/L   CO2 25 22 - 32 mmol/L   Glucose, Bld 137 (H) 70 - 99 mg/dL   BUN 18 8 - 23 mg/dL   Creatinine, Ser 4.401.06 (H) 0.44 - 1.00 mg/dL   Calcium 9.7 8.9 - 34.710.3 mg/dL   Total Protein 7.6 6.5 - 8.1 g/dL   Albumin 4.1 3.5 - 5.0 g/dL   AST 18 15 - 41 U/L   ALT 11 0 - 44 U/L   Alkaline Phosphatase 100 38 - 126 U/L   Total Bilirubin 0.3 0.3 - 1.2 mg/dL   GFR calc non Af Amer 50 (L) >60 mL/min   GFR calc Af Amer 57 (L) >60 mL/min   Anion gap 12 5 - 15    Comment: Performed at [facility], [address] [place], [place] [address]  Troponin I (High Sensitivity)     Status: Abnormal   Collection Time: 01/05/19 11:45 PM  Result Value Ref Range   Troponin I (High Sensitivity) 26 (H) <18 ng/L    Comment: (NOTE) Elevated high sensitivity troponin I (hsTnI) values and significant  changes across serial measurements may suggest ACS but many other  chronic and acute conditions are known to elevate hsTnI results.  Refer to the "Links" section for chest pain algorithms and additional  guidance. Performed at [facility], [address] [place], [place] [address]   Lactic acid, plasma     Status: None   Collection Time: 01/05/19 11:45 PM  Result Value Ref Range   Lactic Acid, Venous 0.7 0.5 - 1.9 mmol/L    Comment: Performed at [facility], [address] [place], [place]  [address]  Urinalysis, Complete w Microscopic     Status: Abnormal   Collection Time: 01/05/19 11:45 PM  Result Value Ref Range   Color, Urine STRAW (A) YELLOW   APPearance CLEAR (A) CLEAR   Specific Gravity, Urine 1.008 1.005 - 1.030   pH 6.0 5.0 - 8.0   Glucose, UA NEGATIVE NEGATIVE mg/dL   Hgb urine dipstick NEGATIVE NEGATIVE   Bilirubin Urine  NEGATIVE NEGATIVE   Ketones, ur NEGATIVE NEGATIVE mg/dL   Protein, ur NEGATIVE NEGATIVE mg/dL   Nitrite NEGATIVE NEGATIVE   Leukocytes,Ua NEGATIVE NEGATIVE   RBC / HPF 0-5 0 - 5 RBC/hpf   WBC, UA 0-5 0 - 5 WBC/hpf   Bacteria, UA NONE SEEN NONE SEEN   Squamous Epithelial / LPF 0-5 0 - 5   Mucus PRESENT     Comment: Performed at [facility], [address] [place], [place] [address]  SARS Coronavirus 2 [facility] order, Performed in [facility] hospital lab) Nasopharyngeal Nasopharyngeal Swab     Status: None   Collection Time: 01/05/19 11:45 PM   Specimen: Nasopharyngeal Swab  Result Value Ref Range   SARS Coronavirus 2 NEGATIVE NEGATIVE    Comment: (NOTE) If result is NEGATIVE SARS-CoV-2 target nucleic acids are NOT DETECTED. The SARS-CoV-2 RNA is generally detectable in upper and lower  respiratory specimens during the acute phase of infection. The lowest  concentration of SARS-CoV-2 viral copies this assay can detect is 250  copies / mL. A negative result does not preclude SARS-CoV-2 infection  and should not be used as the sole basis for treatment or other  patient management decisions.  A negative result may occur with  improper specimen collection / handling, submission of specimen other  than nasopharyngeal swab, presence of viral mutation(s) within the  areas targeted by this assay, and inadequate number of viral copies  (<250 copies / mL). A negative result must be combined with clinical  observations, patient history, and epidemiological information. If result is POSITIVE SARS-CoV-2 target nucleic acids are  DETECTED. The SARS-CoV-2 RNA is generally detectable in upper and lower  respiratory specimens dur ing the acute phase of infection.  Positive  results are indicative of active infection with SARS-CoV-2.  Clinical  correlation with patient history and other diagnostic information is  necessary to determine patient infection status.  Positive results do  not rule out bacterial infection or co-infection with other viruses. If result is PRESUMPTIVE POSTIVE SARS-CoV-2 nucleic acids MAY BE PRESENT.   A presumptive positive result was obtained on the submitted specimen  and confirmed on repeat testing.  While 2019 novel coronavirus  (SARS-CoV-2) nucleic acids may be present in the submitted sample  additional confirmatory testing may be necessary for epidemiological  and / or clinical management purposes  to differentiate between  SARS-CoV-2 and other Sarbecovirus currently known to infect humans.  If clinically indicated additional testing with an alternate test  methodology [phone]) is advised. The SARS-CoV-2 RNA is generally  detectable in upper and lower respiratory sp ecimens during the acute  phase of infection. The expected result is Negative. Fact Sheet for Patients:  BoilerBrush.com.cy Fact Sheet for Healthcare Providers: https://pope.com/ This test is not yet approved or cleared by the [place] FDA and has been authorized for detection and/or diagnosis of SARS-CoV-2 by FDA under an Emergency Use Authorization (EUA).  This EUA will remain in effect (meaning this test can be used) for the duration of the COVID-19 declaration under Section 564(b)(1) of the Act, 21 U.S.C. section 360bbb-3(b)(1), unless the authorization is terminated or revoked sooner. Performed at [facility], [address] [place], [place] [address]   Lactic acid, plasma     Status: None   Collection Time: 01/06/19  2:02 AM  Result Value Ref  Range   Lactic Acid, Venous 0.6 0.5 - 1.9 mmol/L    Comment: Performed at [facility], [address]  [place], [place] [address]  Troponin I (High Sensitivity)     Status: Abnormal   Collection Time: 01/06/19  2:02 AM  Result Value Ref Range   Troponin I (High Sensitivity) 94 (H) <18 ng/L    Comment: READ BACK AND VERIFIED WITH REBECCA LYNN AT 0247 ON 01/06/19 [facility] (NOTE) Elevated high sensitivity troponin I (hsTnI) values and significant  changes across serial measurements may suggest ACS but many other  chronic and acute conditions are known to elevate hsTnI results.  Refer to the "Links" section for chest pain algorithms and additional  guidance. Performed at [facility], [address] [place], [place] [address]   TSH     Status: None   Collection Time: 01/06/19  6:24 AM  Result Value Ref Range   TSH 0.397 0.350 - 4.500 uIU/mL    Comment: Performed by a 3rd Generation assay with a functional sensitivity of <=0.01 uIU/mL. Performed at [facility], [address] [place], [place] [address]   Hemoglobin A1c     Status: Abnormal   Collection Time: 01/06/19  6:24 AM  Result Value Ref Range   Hgb A1c MFr Bld 6.3 (H) 4.8 - 5.6 %    Comment: (NOTE) Pre diabetes:          5.7%-6.4% Diabetes:              >6.4% Glycemic control for   <7.0% adults with diabetes    Mean Plasma Glucose 134.11 mg/dL    Comment: Performed at [facility], [address] [place], [place] [address]  Troponin I (High Sensitivity)     Status: Abnormal   Collection Time: 01/06/19  6:24 AM  Result Value Ref Range   Troponin I (High Sensitivity) 136 (HH) <18 ng/L    Comment: CRITICAL RESULT CALLED TO, READ BACK BY AND VERIFIED WITH YAKANA CROSS @0730  ON 01/06/2019 BY FM[facility] (NOTE) Elevated high sensitivity troponin I (hsTnI) values and significant  changes across serial measurements may suggest ACS but many other  chronic and acute conditions are known to elevate hsTnI  results.  Refer to the Links section for chest pain algorithms and additional  guidance. Performed at [facility], [address] [place], [place] [address]   Troponin I (High Sensitivity)     Status: Abnormal   Collection Time: 01/06/19  8:02 AM  Result Value Ref Range   Troponin I (High Sensitivity) 127 (HH) <18 ng/L    Comment: CRITICAL VALUE NOTED. VALUE IS CONSISTENT WITH PREVIOUSLY REPORTED/CALLED VALUE [facility] (NOTE) Elevated high sensitivity troponin I (hsTnI) values and significant  changes across serial measurements may suggest ACS but many other  chronic and acute conditions are known to elevate hsTnI results.  Refer to the "Links" section for chest pain algorithms and additional  guidance. Performed at [facility], [address] [place], [place] [address]     Dg Chest Port 1 View  Result Date: 01/06/2019 CLINICAL DATA:  Fever, cough EXAM: PORTABLE CHEST 1 VIEW COMPARISON:  04/01/2016 FINDINGS: There is hyperinflation of the lungs compatible with COPD. Heart is borderline in size. Diffuse interstitial prominence likely reflects chronic interstitial lung disease. No confluent opacities or effusions. No acute bony abnormality. IMPRESSION: COPD. Interstitial prominence likely reflects chronic interstitial lung disease. No acute cardiopulmonary disease. Electronically Signed   By: Charlett Nose M.D.   On: 01/06/2019 00:12    Review of Systems  Constitutional: Positive for diaphoresis and malaise/fatigue.  HENT: Positive for congestion.  Eyes: Negative.   Respiratory: Positive for cough and shortness of breath.   Cardiovascular: Positive for orthopnea, leg swelling and PND.  Gastrointestinal: Negative.   Genitourinary: Negative.   Musculoskeletal: Negative.   Skin: Negative.   Neurological: Positive for dizziness and weakness.  Endo/Heme/Allergies: Negative.   Psychiatric/Behavioral: Negative.    Blood pressure (!) 141/98, pulse 71, temperature 98.3  F (36.8 C), temperature source Oral, resp. rate 18, height 5\' 7"  (1.702 m), weight 72.5 kg, SpO2 98 %. Physical Exam  Nursing note and vitals reviewed. Constitutional: She is oriented to person, place, and time. She appears well-developed and well-nourished.  HENT:  Head: Normocephalic and atraumatic.  Eyes: Pupils are equal, round, and reactive to light. Conjunctivae and EOM are normal.  Neck: Normal range of motion. Neck supple.  Cardiovascular: Normal rate, regular rhythm and normal heart sounds.  Respiratory: Effort normal and breath sounds normal.  GI: Soft. Bowel sounds are normal.  Musculoskeletal: Normal range of motion.  Neurological: She is alert and oriented to person, place, and time. She has normal reflexes.  Skin: Skin is warm and dry.  Psychiatric: She has a normal mood and affect.    Assessment/Plan: Shortness of breath Respiratory distress Hypoxemia COPD Borderline troponins Hypertension Hypothyroidism Hyperlipidemia Smoking Chronic renal insufficiency Atrial fibrillation GERD . Plan Agree with admit rule out for myocardial infarction Continue supplemental oxygen therapy Agree with inhalers as necessary Advised patient refrain from smoking Follow-up EKGs and troponins Echocardiogram available for left ventricular function and valvular structures Continue hypertension management and control Consider nephrology input for renal input Recommend anticoagulation long-term for paroxysmal atrial fibrillation Consider outpatient functional study versus cardiac if symptoms persist Refer back to pulmonary for further assessment evaluation  Shatia Sindoni D Quincy Prisco 01/06/2019, 3:44 PM

## 2019-01-06 NOTE — Plan of Care (Signed)
  Problem: Activity: Goal: Ability to tolerate increased activity will improve Outcome: Progressing   Problem: Respiratory: Goal: Ability to maintain a clear airway will improve Outcome: Progressing Goal: Levels of oxygenation will improve Outcome: Progressing Goal: Ability to maintain adequate ventilation will improve Outcome: Progressing   Problem: Health Behavior/Discharge Planning: Goal: Ability to manage health-related needs will improve Outcome: Progressing   Problem: Clinical Measurements: Goal: Ability to maintain clinical measurements within normal limits will improve Outcome: Progressing Goal: Will remain free from infection Outcome: Progressing Goal: Respiratory complications will improve Outcome: Progressing   Problem: Activity: Goal: Risk for activity intolerance will decrease Outcome: Progressing   Problem: Nutrition: Goal: Adequate nutrition will be maintained Outcome: Progressing   Problem: Coping: Goal: Level of anxiety will decrease Outcome: Progressing   Problem: Safety: Goal: Ability to remain free from injury will improve Outcome: Progressing   Problem: Skin Integrity: Goal: Risk for impaired skin integrity will decrease Outcome: Progressing

## 2019-01-06 NOTE — ED Notes (Signed)
ED TO INPATIENT HANDOFF REPORT  ED Nurse Name and Phone #: Lurena JoinerRebecca [phone]  S Name/Age/Gender Eileen Mack 83 y.o. female Room/Bed: ED03A/ED03A  Code Status   Code Status: Prior  Home/SNF/Other Home Patient oriented to: self, place, time and situation Is this baseline? Yes   Triage Complete: Triage complete  Chief Complaint Covid symptoms  Triage Note Pt arrives via [facility] with c/o fever, cough, SOB, and low saturation. Pt was 83% RA for [facility] and is now at 2L [place] with saturation at 100%.    Allergies Allergies  Allergen Reactions  . Iodinated Diagnostic Agents Hives, Itching and Rash    She states she has to be premedicated. SPM  . Sulfa Antibiotics Rash    Level of Care/Admitting Diagnosis ED Disposition    ED Disposition Condition Comment   Admit  Hospital Area: [facility] [[id number]]  Level of Care: Med-Surg [16]  Covid Evaluation: Confirmed COVID Negative  Diagnosis: Acute on chronic respiratory failure with hypoxemia [facility](HCC) [[id number]]) [[id number]]  Admitting Physician: Arnaldo NatalIAMOND, MICHAEL S [[id number]]  Attending Physician: Arnaldo NatalDIAMOND, MICHAEL S [[id number]]  Estimated length of stay: past midnight tomorrow  Certification:: I certify this patient will need inpatient services for at least 2 midnights  PT Class (Do Not Modify): Inpatient [101]  PT Acc Code (Do Not Modify): Private [1]       B Medical/Surgery History Past Medical History:  Diagnosis Date  . Anemia   . Atrial fibrillation (HCC)   . COPD (chronic obstructive pulmonary disease) (HCC)   . GERD (gastroesophageal reflux disease)   . History of blood clots   . Hypertension   . Personal history of tobacco use, presenting hazards to health 07/01/2015  . Renal disorder    Past Surgical History:  Procedure Laterality Date  . ABDOMINAL HYSTERECTOMY    . CARDIAC SURGERY    . CHOLECYSTECTOMY    . COLONOSCOPY WITH PROPOFOL Bilateral 08/01/2015   Procedure: COLONOSCOPY WITH PROPOFOL;  Surgeon: Scot Junobert  T Elliott, MD;  [facility];  Service: Endoscopy;  Laterality: Bilateral;  . ENDOVASCULAR REPAIR/STENT GRAFT N/A 02/21/2018   Procedure: ENDOVASCULAR REPAIR/STENT GRAFT;  Surgeon: Renford DillsSchnier, Gregory G, MD;  Location: [facility] INVASIVE CV LAB;  Service: Cardiovascular;  Laterality: N/A;  . ESOPHAGOGASTRODUODENOSCOPY N/A 07/31/2015   Procedure: ESOPHAGOGASTRODUODENOSCOPY (EGD);  Surgeon: Scot Junobert T Elliott, MD;  [facility];  Service: Endoscopy;  Laterality: N/A;     A IV Location/Drains/Wounds Patient Lines/Drains/Airways Status   Active Line/Drains/Airways    Name:   Placement date:   Placement time:   Site:   Days:   Peripheral IV 01/05/19 Left Antecubital   01/05/19    2345    Antecubital   1   Peripheral IV 01/05/19 Left Wrist   01/05/19    2353    Wrist   1          Intake/Output Last 24 hours No intake or output data in the 24 hours ending 01/06/19 0408  Labs/Imaging Results for orders placed or performed during the hospital encounter of 01/05/19 (from the past 48 hour(s))  CBC with Differential     Status: Abnormal   Collection Time: 01/05/19 11:45 PM  Result Value Ref Range   WBC 6.6 4.0 - 10.5 K/uL   RBC 3.64 (L) 3.87 - 5.11 MIL/uL   Hemoglobin 11.1 (L) 12.0 - 15.0 g/dL   HCT 95.633.9 (L) 21.336.0 - 08.646.0 %   MCV 93.1 80.0 - 100.0 fL   MCH 30.5 26.0 - 34.0 pg  MCHC 32.7 30.0 - 36.0 g/dL   RDW 16.113.9 09.611.5 - 04.515.5 %   Platelets 331 150 - 400 K/uL   nRBC 0.0 0.0 - 0.2 %   Neutrophils Relative % 65 %   Neutro Abs 4.3 1.7 - 7.7 K/uL   Lymphocytes Relative 22 %   Lymphs Abs 1.4 0.7 - 4.0 K/uL   Monocytes Relative 8 %   Monocytes Absolute 0.5 0.1 - 1.0 K/uL   Eosinophils Relative 4 %   Eosinophils Absolute 0.3 0.0 - 0.5 K/uL   Basophils Relative 1 %   Basophils Absolute 0.1 0.0 - 0.1 K/uL   Immature Granulocytes 0 %   Abs Immature Granulocytes 0.02 0.00 - 0.07 K/uL    Comment: Performed at [facility], [address] [place], [place] [address]   Comprehensive metabolic panel     Status: Abnormal   Collection Time: 01/05/19 11:45 PM  Result Value Ref Range   Sodium 140 135 - 145 mmol/L   Potassium 3.8 3.5 - 5.1 mmol/L   Chloride 103 98 - 111 mmol/L   CO2 25 22 - 32 mmol/L   Glucose, Bld 137 (H) 70 - 99 mg/dL   BUN 18 8 - 23 mg/dL   Creatinine, Ser 1.911.06 (H) 0.44 - 1.00 mg/dL   Calcium 9.7 8.9 - 47.810.3 mg/dL   Total Protein 7.6 6.5 - 8.1 g/dL   Albumin 4.1 3.5 - 5.0 g/dL   AST 18 15 - 41 U/L   ALT 11 0 - 44 U/L   Alkaline Phosphatase 100 38 - 126 U/L   Total Bilirubin 0.3 0.3 - 1.2 mg/dL   GFR calc non Af Amer 50 (L) >60 mL/min   GFR calc Af Amer 57 (L) >60 mL/min   Anion gap 12 5 - 15    Comment: Performed at [facility], [address] [place], [place] [address]  Troponin I (High Sensitivity)     Status: Abnormal   Collection Time: 01/05/19 11:45 PM  Result Value Ref Range   Troponin I (High Sensitivity) 26 (H) <18 ng/L    Comment: (NOTE) Elevated high sensitivity troponin I (hsTnI) values and significant  changes across serial measurements may suggest ACS but many other  chronic and acute conditions are known to elevate hsTnI results.  Refer to the "Links" section for chest pain algorithms and additional  guidance. Performed at [facility], [address] [place], [place] [address]   Lactic acid, plasma     Status: None   Collection Time: 01/05/19 11:45 PM  Result Value Ref Range   Lactic Acid, Venous 0.7 0.5 - 1.9 mmol/L    Comment: Performed at [facility], [address] [place], [place] [address]  Urinalysis, Complete w Microscopic     Status: Abnormal   Collection Time: 01/05/19 11:45 PM  Result Value Ref Range   Color, Urine STRAW (A) YELLOW   APPearance CLEAR (A) CLEAR   Specific Gravity, Urine 1.008 1.005 - 1.030   pH 6.0 5.0 - 8.0   Glucose, UA NEGATIVE NEGATIVE mg/dL   Hgb urine dipstick NEGATIVE NEGATIVE   Bilirubin Urine NEGATIVE NEGATIVE   Ketones, ur NEGATIVE  NEGATIVE mg/dL   Protein, ur NEGATIVE NEGATIVE mg/dL   Nitrite NEGATIVE NEGATIVE   Leukocytes,Ua NEGATIVE NEGATIVE   RBC / HPF 0-5 0 - 5 RBC/hpf   WBC, UA 0-5 0 - 5 WBC/hpf   Bacteria, UA NONE SEEN NONE SEEN   Squamous Epithelial / LPF 0-5  0 - 5   Mucus PRESENT     Comment: Performed at [facility], [address] [place], [place] [address]  SARS Coronavirus 2 [facility] order, Performed in [facility] hospital lab) Nasopharyngeal Nasopharyngeal Swab     Status: None   Collection Time: 01/05/19 11:45 PM   Specimen: Nasopharyngeal Swab  Result Value Ref Range   SARS Coronavirus 2 NEGATIVE NEGATIVE    Comment: (NOTE) If result is NEGATIVE SARS-CoV-2 target nucleic acids are NOT DETECTED. The SARS-CoV-2 RNA is generally detectable in upper and lower  respiratory specimens during the acute phase of infection. The lowest  concentration of SARS-CoV-2 viral copies this assay can detect is 250  copies / mL. A negative result does not preclude SARS-CoV-2 infection  and should not be used as the sole basis for treatment or other  patient management decisions.  A negative result may occur with  improper specimen collection / handling, submission of specimen other  than nasopharyngeal swab, presence of viral mutation(s) within the  areas targeted by this assay, and inadequate number of viral copies  (<250 copies / mL). A negative result must be combined with clinical  observations, patient history, and epidemiological information. If result is POSITIVE SARS-CoV-2 target nucleic acids are DETECTED. The SARS-CoV-2 RNA is generally detectable in upper and lower  respiratory specimens dur ing the acute phase of infection.  Positive  results are indicative of active infection with SARS-CoV-2.  Clinical  correlation with patient history and other diagnostic information is  necessary to determine patient infection status.  Positive results do  not rule out bacterial infection or  co-infection with other viruses. If result is PRESUMPTIVE POSTIVE SARS-CoV-2 nucleic acids MAY BE PRESENT.   A presumptive positive result was obtained on the submitted specimen  and confirmed on repeat testing.  While 2019 novel coronavirus  (SARS-CoV-2) nucleic acids may be present in the submitted sample  additional confirmatory testing may be necessary for epidemiological  and / or clinical management purposes  to differentiate between  SARS-CoV-2 and other Sarbecovirus currently known to infect humans.  If clinically indicated additional testing with an alternate test  methodology [phone]) is advised. The SARS-CoV-2 RNA is generally  detectable in upper and lower respiratory sp ecimens during the acute  phase of infection. The expected result is Negative. Fact Sheet for Patients:  StrictlyIdeas.no Fact Sheet for Healthcare Providers: BankingDealers.co.za This test is not yet approved or cleared by the [place] FDA and has been authorized for detection and/or diagnosis of SARS-CoV-2 by FDA under an Emergency Use Authorization (EUA).  This EUA will remain in effect (meaning this test can be used) for the duration of the COVID-19 declaration under Section 564(b)(1) of the Act, 21 U.S.C. section 360bbb-3(b)(1), unless the authorization is terminated or revoked sooner. Performed at [facility], [address] [place], [place] [address]   Lactic acid, plasma     Status: None   Collection Time: 01/06/19  2:02 AM  Result Value Ref Range   Lactic Acid, Venous 0.6 0.5 - 1.9 mmol/L    Comment: Performed at [facility], [address] [place], [place] [address]  Troponin I (High Sensitivity)     Status: Abnormal   Collection Time: 01/06/19  2:02 AM  Result Value Ref Range   Troponin I (High Sensitivity) 94 (H) <18 ng/L    Comment: READ BACK AND VERIFIED WITH Carlita Whitcomb AT 0247 ON 01/06/19 RWW (NOTE) Elevated  high sensitivity troponin I (hsTnI)  values and significant  changes across serial measurements may suggest ACS but many other  chronic and acute conditions are known to elevate hsTnI results.  Refer to the "Links" section for chest pain algorithms and additional  guidance. Performed at [facility], [address] [place], [place] [address]    Dg Chest Port 1 View  Result Date: 01/06/2019 CLINICAL DATA:  Fever, cough EXAM: PORTABLE CHEST 1 VIEW COMPARISON:  04/01/2016 FINDINGS: There is hyperinflation of the lungs compatible with COPD. Heart is borderline in size. Diffuse interstitial prominence likely reflects chronic interstitial lung disease. No confluent opacities or effusions. No acute bony abnormality. IMPRESSION: COPD. Interstitial prominence likely reflects chronic interstitial lung disease. No acute cardiopulmonary disease. Electronically Signed   By: Charlett Nose M.D.   On: 01/06/2019 00:12    Pending Labs Unresulted Labs (From admission, onward)    Start     Ordered   01/05/19 2342  Culture, blood (routine x 2)  BLOOD CULTURE X 2,   STAT     01/05/19 2342   01/05/19 2342  Urine culture  Once,   STAT     01/05/19 2342   Signed and Held  Creatinine, serum  (enoxaparin (LOVENOX)    CrCl >/= 30 ml/min)  Weekly,   R    Comments: while on enoxaparin therapy    Signed and Held   Signed and Held  TSH  Add-on,   R     Signed and Held   Signed and Held  Hemoglobin A1c  Add-on,   R     Signed and Held          Vitals/Pain Today's Vitals   01/06/19 0000 01/06/19 0130 01/06/19 0230 01/06/19 0300  BP: (!) 195/92 (!) 158/69 (!) 144/63 (!) 156/70  Pulse: (!) 111 77 67 65  Resp: (!) 22 16 16 15   Temp:      TempSrc:      SpO2: 100% 100% 96% 95%  Weight:      Height:      PainSc:        Isolation Precautions No active isolations  Medications Medications  ipratropium-albuterol (DUONEB) 0.5-2.5 (3) MG/3ML nebulizer solution 3 mL (3 mLs Nebulization Given 01/06/19  0205)  methylPREDNISolone sodium succinate (SOLU-MEDROL) 125 mg/2 mL injection 125 mg (125 mg Intravenous Given 01/06/19 0206)    Mobility walks Low fall risk   Focused Assessments Pulmonary Assessment Handoff:  Lung sounds:   O2 Device: Nasal Cannula O2 Flow Rate (L/min): 2 L/min      R Recommendations: See Admitting Provider Note  Report given to:   Additional Notes:

## 2019-01-06 NOTE — Progress Notes (Signed)
Patient has removed her telemetry box and put on her personal clothing and stated that she is ready to leave. This RN has asked the patient to please wait for the attending to prepare discharge orders. Patient has stated that she would do so.

## 2019-01-06 NOTE — H&P (Signed)
Eileen Mack is an 83 y.o. female.   Chief Complaint: Fever HPI: Medical history of COPD, hypertension and hypothyroidism presents to the emergency department via EMS due to fever.  The patient also had oxygen saturations of 83% on room air upon arrival.  She was placed on 3 L of oxygen via nasal cannula and given Solu-Medrol in addition to DuoNeb treatment.  The patient does not usually wear oxygen at home.  Thus, due to need for supplemental oxygen the emergency department staff call hospitalist service for admission.  Past Medical History:  Diagnosis Date  . Anemia   . Atrial fibrillation (HCC)   . COPD (chronic obstructive pulmonary disease) (HCC)   . GERD (gastroesophageal reflux disease)   . History of blood clots   . Hypertension   . Personal history of tobacco use, presenting hazards to health 07/01/2015  . Renal disorder     Past Surgical History:  Procedure Laterality Date  . ABDOMINAL HYSTERECTOMY    . CARDIAC SURGERY    . CHOLECYSTECTOMY    . COLONOSCOPY WITH PROPOFOL Bilateral 08/01/2015   Procedure: COLONOSCOPY WITH PROPOFOL;  Surgeon: Scot Junobert T Elliott, MD;  [facility];  Service: Endoscopy;  Laterality: Bilateral;  . ENDOVASCULAR REPAIR/STENT GRAFT N/A 02/21/2018   Procedure: ENDOVASCULAR REPAIR/STENT GRAFT;  Surgeon: Renford DillsSchnier, Gregory G, MD;  Location: [facility] INVASIVE CV LAB;  Service: Cardiovascular;  Laterality: N/A;  . ESOPHAGOGASTRODUODENOSCOPY N/A 07/31/2015   Procedure: ESOPHAGOGASTRODUODENOSCOPY (EGD);  Surgeon: Scot Junobert T Elliott, MD;  [facility] END[facility];  Service: Endoscopy;  Laterality: N/A;    History reviewed. No pertinent family history. Social History:  reports that she has been smoking. She has a 60.00 pack-year smoking history. She has never used smokeless tobacco. She reports current alcohol use. She reports that she does not use drugs.  Allergies:  Allergies  Allergen Reactions  . Iodinated Diagnostic Agents Hives, Itching and Rash     She states she has to be premedicated. SPM  . Sulfa Antibiotics Rash    Medications Prior to Admission  Medication Sig Dispense Refill  . aspirin EC 81 MG tablet Take 81 mg by mouth daily.    . carvedilol (COREG) 25 MG tablet Take 25 mg by mouth 2 (two) times daily with a meal.    . cloNIDine (CATAPRES) 0.2 MG tablet Take 0.2 mg by mouth 2 (two) times daily.    . diphenhydramine-acetaminophen (TYLENOL PM) 25-500 MG TABS tablet Take 1 tablet by mouth at bedtime.    . ferrous sulfate 325 (65 FE) MG tablet Take 1 tablet (325 mg total) by mouth daily with breakfast. 60 tablet 3  . gabapentin (NEURONTIN) 600 MG tablet Take 600 mg by mouth 2 (two) times daily.    Marland Kitchen. gemfibrozil (LOPID) 600 MG tablet Take 600 mg by mouth 2 (two) times daily before a meal.    . levothyroxine (SYNTHROID, LEVOTHROID) 100 MCG tablet Take 100 mcg by mouth daily before breakfast.    . Multiple Vitamins-Minerals (MULTIVITAMIN GUMMIES ADULT PO) Take 1 each by mouth daily.    . nitroGLYCERIN (NITROSTAT) 0.4 MG SL tablet Place 0.4 mg under the tongue every 5 (five) minutes as needed for chest pain.    Marland Kitchen. omeprazole (PRILOSEC) 20 MG capsule Take 20 mg by mouth daily.     . pravastatin (PRAVACHOL) 40 MG tablet Take 40 mg by mouth at bedtime.    . Venlafaxine HCl 225 MG TB24 Take 225 mg by mouth daily.       Results  for orders placed or performed during the hospital encounter of 01/05/19 (from the past 48 hour(s))  CBC with Differential     Status: Abnormal   Collection Time: 01/05/19 11:45 PM  Result Value Ref Range   WBC 6.6 4.0 - 10.5 K/uL   RBC 3.64 (L) 3.87 - 5.11 MIL/uL   Hemoglobin 11.1 (L) 12.0 - 15.0 g/dL   HCT 33.9 (L) 36.0 - 46.0 %   MCV 93.1 80.0 - 100.0 fL   MCH 30.5 26.0 - 34.0 pg   MCHC 32.7 30.0 - 36.0 g/dL   RDW 13.9 11.5 - 15.5 %   Platelets 331 150 - 400 K/uL   nRBC 0.0 0.0 - 0.2 %   Neutrophils Relative % 65 %   Neutro Abs 4.3 1.7 - 7.7 K/uL   Lymphocytes Relative 22 %   Lymphs Abs 1.4 0.7 -  4.0 K/uL   Monocytes Relative 8 %   Monocytes Absolute 0.5 0.1 - 1.0 K/uL   Eosinophils Relative 4 %   Eosinophils Absolute 0.3 0.0 - 0.5 K/uL   Basophils Relative 1 %   Basophils Absolute 0.1 0.0 - 0.1 K/uL   Immature Granulocytes 0 %   Abs Immature Granulocytes 0.02 0.00 - 0.07 K/uL    Comment: Performed at [facility], [address] [place], [place] [address]  Comprehensive metabolic panel     Status: Abnormal   Collection Time: 01/05/19 11:45 PM  Result Value Ref Range   Sodium 140 135 - 145 mmol/L   Potassium 3.8 3.5 - 5.1 mmol/L   Chloride 103 98 - 111 mmol/L   CO2 25 22 - 32 mmol/L   Glucose, Bld 137 (H) 70 - 99 mg/dL   BUN 18 8 - 23 mg/dL   Creatinine, Ser 1.06 (H) 0.44 - 1.00 mg/dL   Calcium 9.7 8.9 - 10.3 mg/dL   Total Protein 7.6 6.5 - 8.1 g/dL   Albumin 4.1 3.5 - 5.0 g/dL   AST 18 15 - 41 U/L   ALT 11 0 - 44 U/L   Alkaline Phosphatase 100 38 - 126 U/L   Total Bilirubin 0.3 0.3 - 1.2 mg/dL   GFR calc non Af Amer 50 (L) >60 mL/min   GFR calc Af Amer 57 (L) >60 mL/min   Anion gap 12 5 - 15    Comment: Performed at [facility], [place], [place] [address]  Troponin I (High Sensitivity)     Status: Abnormal   Collection Time: 01/05/19 11:45 PM  Result Value Ref Range   Troponin I (High Sensitivity) 26 (H) <18 ng/L    Comment: (NOTE) Elevated high sensitivity troponin I (hsTnI) values and significant  changes across serial measurements may suggest ACS but many other  chronic and acute conditions are known to elevate hsTnI results.  Refer to the "Links" section for chest pain algorithms and additional  guidance. Performed at [facility], [address] [place], [place] [address]   Lactic acid, plasma     Status: None   Collection Time: 01/05/19 11:45 PM  Result Value Ref Range   Lactic Acid, Venous 0.7 0.5 - 1.9 mmol/L    Comment: Performed at [facility], [address] [place],  [address]   Urinalysis, Complete w Microscopic     Status: Abnormal   Collection Time: 01/05/19 11:45 PM  Result Value Ref Range   Color, Urine STRAW (A) YELLOW   APPearance CLEAR (A) CLEAR   Specific  Gravity, Urine 1.008 1.005 - 1.030   pH 6.0 5.0 - 8.0   Glucose, UA NEGATIVE NEGATIVE mg/dL   Hgb urine dipstick NEGATIVE NEGATIVE   Bilirubin Urine NEGATIVE NEGATIVE   Ketones, ur NEGATIVE NEGATIVE mg/dL   Protein, ur NEGATIVE NEGATIVE mg/dL   Nitrite NEGATIVE NEGATIVE   Leukocytes,Ua NEGATIVE NEGATIVE   RBC / HPF 0-5 0 - 5 RBC/hpf   WBC, UA 0-5 0 - 5 WBC/hpf   Bacteria, UA NONE SEEN NONE SEEN   Squamous Epithelial / LPF 0-5 0 - 5   Mucus PRESENT     Comment: Performed at [facility], [address], [place], [place] [address]  SARS Coronavirus 2 [facility] order, Performed in [facility] hospital lab) Nasopharyngeal Nasopharyngeal Swab     Status: None   Collection Time: 01/05/19 11:45 PM   Specimen: Nasopharyngeal Swab  Result Value Ref Range   SARS Coronavirus 2 NEGATIVE NEGATIVE    Comment: (NOTE) If result is NEGATIVE SARS-CoV-2 target nucleic acids are NOT DETECTED. The SARS-CoV-2 RNA is generally detectable in upper and lower  respiratory specimens during the acute phase of infection. The lowest  concentration of SARS-CoV-2 viral copies this assay can detect is 250  copies / mL. A negative result does not preclude SARS-CoV-2 infection  and should not be used as the sole basis for treatment or other  patient management decisions.  A negative result may occur with  improper specimen collection / handling, submission of specimen other  than nasopharyngeal swab, presence of viral mutation(s) within the  areas targeted by this assay, and inadequate number of viral copies  (<250 copies / mL). A negative result must be combined with clinical  observations, patient history, and epidemiological information. If result is POSITIVE SARS-CoV-2 target nucleic acids are DETECTED. The  SARS-CoV-2 RNA is generally detectable in upper and lower  respiratory specimens dur ing the acute phase of infection.  Positive  results are indicative of active infection with SARS-CoV-2.  Clinical  correlation with patient history and other diagnostic information is  necessary to determine patient infection status.  Positive results do  not rule out bacterial infection or co-infection with other viruses. If result is PRESUMPTIVE POSTIVE SARS-CoV-2 nucleic acids MAY BE PRESENT.   A presumptive positive result was obtained on the submitted specimen  and confirmed on repeat testing.  While 2019 novel coronavirus  (SARS-CoV-2) nucleic acids may be present in the submitted sample  additional confirmatory testing may be necessary for epidemiological  and / or clinical management purposes  to differentiate between  SARS-CoV-2 and other Sarbecovirus currently known to infect humans.  If clinically indicated additional testing with an alternate test  methodology [phone]) is advised. The SARS-CoV-2 RNA is generally  detectable in upper and lower respiratory sp ecimens during the acute  phase of infection. The expected result is Negative. Fact Sheet for Patients:  BoilerBrush.com.cy Fact Sheet for Healthcare Providers: https://pope.com/ This test is not yet approved or cleared by the [place] FDA and has been authorized for detection and/or diagnosis of SARS-CoV-2 by FDA under an Emergency Use Authorization (EUA).  This EUA will remain in effect (meaning this test can be used) for the duration of the COVID-19 declaration under Section 564(b)(1) of the Act, 21 U.S.C. section 360bbb-3(b)(1), unless the authorization is terminated or revoked sooner. Performed at [facility], [address] [place], [place] [address]   Lactic acid, plasma     Status: None   Collection Time: 01/06/19  2:02 AM  Result Value Ref Range   Lactic  Acid, Venous 0.6 0.5 - 1.9 mmol/L    Comment: Performed at [facility], [address] [place], [place] [address]  Troponin I (High Sensitivity)     Status: Abnormal   Collection Time: 01/06/19  2:02 AM  Result Value Ref Range   Troponin I (High Sensitivity) 94 (H) <18 ng/L    Comment: READ BACK AND VERIFIED WITH REBECCA LYNN AT 0247 ON 01/06/19 [facility] (NOTE) Elevated high sensitivity troponin I (hsTnI) values and significant  changes across serial measurements may suggest ACS but many other  chronic and acute conditions are known to elevate hsTnI results.  Refer to the "Links" section for chest pain algorithms and additional  guidance. Performed at [facility], [address] [place], [place] [address]   TSH     Status: None   Collection Time: 01/06/19  6:24 AM  Result Value Ref Range   TSH 0.397 0.350 - 4.500 uIU/mL    Comment: Performed by a 3rd Generation assay with a functional sensitivity of <=0.01 uIU/mL. Performed at [facility], [address] [place], [place] [address]   Troponin I (High Sensitivity)     Status: Abnormal   Collection Time: 01/06/19  6:24 AM  Result Value Ref Range   Troponin I (High Sensitivity) 136 (HH) <18 ng/L    Comment: CRITICAL RESULT CALLED TO, READ BACK BY AND VERIFIED WITH YAKANA CROSS @0730  ON 01/06/2019 BY FMW (NOTE) Elevated high sensitivity troponin I (hsTnI) values and significant  changes across serial measurements may suggest ACS but many other  chronic and acute conditions are known to elevate hsTnI results.  Refer to the Links section for chest pain algorithms and additional  guidance. Performed at [facility], [address] [place], [place] [address]    Dg Chest Port 1 View  Result Date: 01/06/2019 CLINICAL DATA:  Fever, cough EXAM: PORTABLE CHEST 1 VIEW COMPARISON:  04/01/2016 FINDINGS: There is hyperinflation of the lungs compatible with COPD. Heart is borderline in size. Diffuse  interstitial prominence likely reflects chronic interstitial lung disease. No confluent opacities or effusions. No acute bony abnormality. IMPRESSION: COPD. Interstitial prominence likely reflects chronic interstitial lung disease. No acute cardiopulmonary disease. Electronically Signed   By: Charlett Nose M.D.   On: 01/06/2019 00:12    Review of Systems  Constitutional: Positive for fever. Negative for chills.  HENT: Negative for sore throat and tinnitus.   Eyes: Negative for blurred vision and redness.  Respiratory: Positive for shortness of breath. Negative for cough.   Cardiovascular: Negative for chest pain, palpitations, orthopnea and PND.  Gastrointestinal: Negative for abdominal pain, diarrhea, nausea and vomiting.  Genitourinary: Negative for dysuria, frequency and urgency.  Musculoskeletal: Negative for joint pain and myalgias.  Skin: Negative for rash.       No lesions  Neurological: Negative for speech change, focal weakness and weakness.  Endo/Heme/Allergies: Does not bruise/bleed easily.       No temperature intolerance  Psychiatric/Behavioral: Negative for depression and suicidal ideas.    Blood pressure (!) 161/88, pulse (!) 110, temperature 98.5 F (36.9 C), temperature source Oral, resp. rate 18, height 5\' 7"  (1.702 m), weight 72.5 kg, SpO2 99 %. Physical Exam  Vitals reviewed. Constitutional: She is oriented to person, place, and time. She appears well-developed and well-nourished. No distress.  HENT:  Head: Normocephalic and atraumatic.  Mouth/Throat: Oropharynx is clear and moist.  Eyes: Pupils are equal, round, and  reactive to light. Conjunctivae and EOM are normal. No scleral icterus.  Neck: Normal range of motion. No tracheal deviation present. No thyromegaly present.  Cardiovascular: Normal rate, regular rhythm and normal heart sounds. Exam reveals no gallop and no friction rub.  No murmur heard. Respiratory: Effort normal and breath sounds normal.  GI: Soft.  Bowel sounds are normal. She exhibits no distension. There is no abdominal tenderness.  Genitourinary:    Genitourinary Comments: Deferred   Musculoskeletal: Normal range of motion.        General: No edema.  Lymphadenopathy:    She has no cervical adenopathy.  Neurological: She is alert and oriented to person, place, and time. No cranial nerve deficit. She exhibits normal muscle tone.  Skin: Skin is warm and dry. No rash noted. No erythema.  Psychiatric: She has a normal mood and affect. Her behavior is normal. Judgment and thought content normal.     Assessment/Plan This is an 83 year old female admitted for respiratory failure. 1.  Respiratory failure: Acute on chronic; with hypoxemia.  Respiratory distress greatly improved following breathing treatments and Solu-Medrol.  The patient had been on 3 L of oxygen via nasal cannula and is now weaned to room air.  Continue to follow respiratory status.  Monitor pulse oximetry. 2.  COPD: With exacerbation; azithromycin and steroid taper for anti-inflammatory effect.  Continue Spiriva and schedule albuterol.  Consider addition of inhaled corticosteroid. 3.  Elevated troponin: Likely troponin leak secondary to demand ischemia.  Continue to follow cardiac biomarkers.  Monitor telemetry.  Consult cardiology. 4.  Hypertension: Uncontrolled; continue carvedilol and clonidine.  This is the patient's home regimen but it is an odd combination.  Consider substituting amlodipine for carvedilol. 5.  Hypothyroidism: Recheck TSH; continue Synthroid 6.  Hyperlipidemia: Continue statin therapy as well as gemfibrozil 7.  DVT prophylaxis: 8.  GI prophylaxis: PPI per home regimen The patient is a DNR.  Time spent on admission orders and patient care approximately 45 minutes  Arnaldo Nataliamond,  Quynh Basso S, MD 01/06/2019, 7:40 AM

## 2019-01-06 NOTE — ED Notes (Signed)
Pt taken off of 2L O2. Pt maintaining 96% on RA.

## 2019-01-07 LAB — URINE CULTURE: Culture: NO GROWTH

## 2019-01-07 NOTE — Plan of Care (Signed)
  Problem: Activity: Goal: Ability to tolerate increased activity will improve Outcome: Adequate for Discharge   Problem: Respiratory: Goal: Ability to maintain a clear airway will improve Outcome: Adequate for Discharge Goal: Levels of oxygenation will improve Outcome: Adequate for Discharge Goal: Ability to maintain adequate ventilation will improve Outcome: Adequate for Discharge   Problem: Health Behavior/Discharge Planning: Goal: Ability to manage health-related needs will improve Outcome: Adequate for Discharge   Problem: Clinical Measurements: Goal: Ability to maintain clinical measurements within normal limits will improve Outcome: Adequate for Discharge Goal: Will remain free from infection Outcome: Adequate for Discharge Goal: Respiratory complications will improve Outcome: Adequate for Discharge   Problem: Activity: Goal: Risk for activity intolerance will decrease Outcome: Adequate for Discharge   Problem: Nutrition: Goal: Adequate nutrition will be maintained Outcome: Adequate for Discharge   Problem: Coping: Goal: Level of anxiety will decrease Outcome: Adequate for Discharge   Problem: Safety: Goal: Ability to remain free from injury will improve Outcome: Adequate for Discharge   Problem: Skin Integrity: Goal: Risk for impaired skin integrity will decrease Outcome: Adequate for Discharge

## 2019-01-11 ENCOUNTER — Telehealth: Payer: Self-pay | Admitting: *Deleted

## 2019-01-11 ENCOUNTER — Encounter: Payer: Self-pay | Admitting: *Deleted

## 2019-01-11 LAB — CULTURE, BLOOD (ROUTINE X 2)
Culture: NO GROWTH
Culture: NO GROWTH
Special Requests: ADEQUATE
Special Requests: ADEQUATE

## 2019-01-11 NOTE — Telephone Encounter (Signed)
Received referral for lung screening/nodule referral. Despite multiple attempts I have been unable to contact patient by phone to arrange for this lung scan. I will send a letter to the patient as a last effort to contact and will fax this note to referring provider as well.

## 2019-02-13 ENCOUNTER — Telehealth: Payer: Self-pay | Admitting: *Deleted

## 2019-02-13 ENCOUNTER — Ambulatory Visit: Payer: Medicare HMO

## 2019-02-13 ENCOUNTER — Other Ambulatory Visit: Payer: Self-pay

## 2019-02-13 ENCOUNTER — Ambulatory Visit
Admission: RE | Admit: 2019-02-13 | Discharge: 2019-02-13 | Disposition: A | Discharge status: Home / Self Care | Payer: Medicare HMO | Source: Ambulatory Visit | Attending: Oncology | Admitting: Oncology

## 2019-02-13 DIAGNOSIS — I2510 Atherosclerotic heart disease of native coronary artery without angina pectoris: Secondary | ICD-10-CM | POA: Insufficient documentation

## 2019-02-13 DIAGNOSIS — Z122 Encounter for screening for malignant neoplasm of respiratory organs: Secondary | ICD-10-CM | POA: Diagnosis not present

## 2019-02-13 DIAGNOSIS — Z87891 Personal history of nicotine dependence: Secondary | ICD-10-CM

## 2019-02-13 DIAGNOSIS — F17210 Nicotine dependence, cigarettes, uncomplicated: Secondary | ICD-10-CM | POA: Insufficient documentation

## 2019-02-13 DIAGNOSIS — J439 Emphysema, unspecified: Secondary | ICD-10-CM | POA: Diagnosis not present

## 2019-02-13 DIAGNOSIS — I700 Atherosclerosis of aorta: Secondary | ICD-10-CM | POA: Insufficient documentation

## 2019-02-13 NOTE — Telephone Encounter (Signed)
Patient has been notified that annual lung cancer screening low dose CT scan is due currently or will be in near future. Confirmed that patient is within the age range, and asymptomatic, (no signs or symptoms of lung cancer). Patient denies illness that would prevent curative treatment for lung cancer if found. Verified smoking history, (current, 62 pack year). The shared decision making visit was done 07/03/15. Patient is agreeable for CT scan being scheduled.

## 2019-02-18 ENCOUNTER — Telehealth: Payer: Self-pay | Admitting: *Deleted

## 2019-02-18 NOTE — Telephone Encounter (Signed)
Notified patient of LDCT lung cancer screening program results with recommendation for 12 month follow up imaging. Also notified of incidental findings noted below and is encouraged to discuss further with PCP who will receive a copy of this note and/or the CT report. Patient verbalizes understanding.   Patient knows that she is not eligible for lung screening program scans due to age of 83 years old. She is aware of our lung nodule programs and will discuss this with her new PCP which she is in the process of obtaining.

## 2019-02-26 ENCOUNTER — Encounter: Payer: Self-pay | Admitting: Family Medicine

## 2019-02-26 ENCOUNTER — Other Ambulatory Visit: Payer: Self-pay

## 2019-02-26 ENCOUNTER — Ambulatory Visit (INDEPENDENT_AMBULATORY_CARE_PROVIDER_SITE_OTHER): Payer: Medicare HMO | Admitting: Family Medicine

## 2019-02-26 VITALS — BP 155/69 | HR 58 | Temp 97.3°F | Resp 16 | Ht 66.0 in | Wt 164.2 lb

## 2019-02-26 DIAGNOSIS — E7800 Pure hypercholesterolemia, unspecified: Secondary | ICD-10-CM

## 2019-02-26 DIAGNOSIS — G8929 Other chronic pain: Secondary | ICD-10-CM | POA: Insufficient documentation

## 2019-02-26 DIAGNOSIS — I714 Abdominal aortic aneurysm, without rupture, unspecified: Secondary | ICD-10-CM

## 2019-02-26 DIAGNOSIS — K219 Gastro-esophageal reflux disease without esophagitis: Secondary | ICD-10-CM

## 2019-02-26 DIAGNOSIS — G629 Polyneuropathy, unspecified: Secondary | ICD-10-CM

## 2019-02-26 DIAGNOSIS — N2889 Other specified disorders of kidney and ureter: Secondary | ICD-10-CM | POA: Insufficient documentation

## 2019-02-26 DIAGNOSIS — F330 Major depressive disorder, recurrent, mild: Secondary | ICD-10-CM

## 2019-02-26 DIAGNOSIS — N644 Mastodynia: Secondary | ICD-10-CM | POA: Insufficient documentation

## 2019-02-26 DIAGNOSIS — I25118 Atherosclerotic heart disease of native coronary artery with other forms of angina pectoris: Secondary | ICD-10-CM | POA: Diagnosis not present

## 2019-02-26 DIAGNOSIS — M25511 Pain in right shoulder: Secondary | ICD-10-CM

## 2019-02-26 DIAGNOSIS — J439 Emphysema, unspecified: Secondary | ICD-10-CM | POA: Diagnosis not present

## 2019-02-26 DIAGNOSIS — D692 Other nonthrombocytopenic purpura: Secondary | ICD-10-CM | POA: Insufficient documentation

## 2019-02-26 DIAGNOSIS — R918 Other nonspecific abnormal finding of lung field: Secondary | ICD-10-CM

## 2019-02-26 DIAGNOSIS — Z23 Encounter for immunization: Secondary | ICD-10-CM | POA: Diagnosis not present

## 2019-02-26 DIAGNOSIS — F17200 Nicotine dependence, unspecified, uncomplicated: Secondary | ICD-10-CM | POA: Diagnosis not present

## 2019-02-26 DIAGNOSIS — I10 Essential (primary) hypertension: Secondary | ICD-10-CM

## 2019-02-26 DIAGNOSIS — I480 Paroxysmal atrial fibrillation: Secondary | ICD-10-CM

## 2019-02-26 DIAGNOSIS — E039 Hypothyroidism, unspecified: Secondary | ICD-10-CM

## 2019-02-26 DIAGNOSIS — D509 Iron deficiency anemia, unspecified: Secondary | ICD-10-CM

## 2019-02-26 DIAGNOSIS — I83813 Varicose veins of bilateral lower extremities with pain: Secondary | ICD-10-CM
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
## 2019-02-27 ENCOUNTER — Encounter: Payer: Self-pay | Admitting: Family Medicine

## 2019-02-27 NOTE — Assessment & Plan Note (Addendum)
Chronic, stable, well controlled Continue gabapentin at current dose Reviewed last GFR

## 2019-02-27 NOTE — Assessment & Plan Note (Signed)
Reassurance given Noted on exam

## 2019-02-27 NOTE — Assessment & Plan Note (Signed)
Chronic and stable Continue anti-inflammatories as needed

## 2019-02-27 NOTE — Assessment & Plan Note (Signed)
Continue statin Reviewed last lipid panel Goal LDL in the setting of her heart disease Discussed importance of lipid management in reducing the progression of her atherosclerotic disease

## 2019-02-27 NOTE — Assessment & Plan Note (Signed)
Noted on recent CT chest for lung cancer screening Followed by Cardiology She is status post 3 stent placements She is also medically managed No current chest pain

## 2019-02-27 NOTE — Assessment & Plan Note (Signed)
Noted on exam Followed by vascular surgery

## 2019-02-27 NOTE — Assessment & Plan Note (Signed)
Continue iron supplement Reviewed last labs Unclear of etiology of her iron deficiency anemia, but seems stable over several years

## 2019-02-27 NOTE — Assessment & Plan Note (Signed)
Previously well controlled Continue Synthroid at current dose  Reviewed recent TSH

## 2019-02-27 NOTE — Assessment & Plan Note (Signed)
Uncontrolled currently Patient is also followed by cardiology She states that she has whitecoat syndrome and her blood pressure is usually well controlled No changes to medications at this time, but if still elevated at follow-up, may consider dose changes Reviewed recent metabolic panel

## 2019-02-27 NOTE — Assessment & Plan Note (Signed)
3 to 5-minute discussion regarding the harms of continuing to smoke and the benefits of cessation Discussed the importance of tobacco cessation in the setting of her hypertension, CAD, AAA, and other medical problems Patient remains precontemplative and not interested in quitting at this time We will continue to reassess at future visits and encourage cessation

## 2019-02-27 NOTE — Assessment & Plan Note (Signed)
Reviewed last few lung CT screenings They have been stable and benign appearing Continue annual chest CT

## 2019-02-27 NOTE — Assessment & Plan Note (Signed)
Chronic and well-controlled Continue Effexor at current dose She has tried to taper and wean in the past and had recurrence of symptoms Encourage therapy

## 2019-02-27 NOTE — Assessment & Plan Note (Signed)
Patient is followed by cardiology and vascular surgery she is doing well overall Encouraged her to follow-up regularly with vascular surgery and have her imaging as she is supposed to She is status post stent graft Encourage good risk factor management, including tobacco cessation and good blood pressure control

## 2019-02-27 NOTE — Assessment & Plan Note (Signed)
Intermittent problem Since she states that this is changing and evolving, we will obtain mammogram and ultrasound of the left breast for further evaluation

## 2019-02-27 NOTE — Assessment & Plan Note (Signed)
Chronic and well-controlled Continue PPI Reviewed recent DEXA scan Discussed risks of long-term PPI therapy with the patient

## 2019-02-27 NOTE — Assessment & Plan Note (Signed)
Chronic, mild Recent exacerbation Not on a controller medication If she has another exacerbation, she will definitely need a controller medication Can continue albuterol as needed Stressed importance of tobacco cessation

## 2019-02-27 NOTE — Assessment & Plan Note (Signed)
Patient is followed by cardiology She was previously rhythm controlled, but not currently She has also had ablations previously She was previously on chronic anticoagulation, but she discontinued Coumadin after 2 episodes of acute severe anemia and Eliquis as she could not afford it Continue aspirin She is in normal sinus rhythm today

## 2019-04-15 ENCOUNTER — Other Ambulatory Visit: Payer: Self-pay | Admitting: Family Medicine

## 2019-04-15 MED ORDER — LEVOTHYROXINE SODIUM 100 MCG PO TABS: 100.0000 ug | ORAL_TABLET | Freq: Every day | ORAL | 0 refills | Status: DC

## 2019-04-15 NOTE — Telephone Encounter (Signed)
Pt request refill   levothyroxine (SYNTHROID, LEVOTHROID) 100 MCG tablet Pt is new with Dr B and the dr has never written this Rx  90 day supply  [place], [place] Phone:  [phone]  Fax:  [phone]

## 2019-04-22 ENCOUNTER — Encounter: Payer: Self-pay | Admitting: Family Medicine

## 2019-04-22 ENCOUNTER — Ambulatory Visit (INDEPENDENT_AMBULATORY_CARE_PROVIDER_SITE_OTHER): Payer: Medicare HMO | Admitting: Family Medicine

## 2019-04-22 ENCOUNTER — Other Ambulatory Visit: Payer: Self-pay

## 2019-04-22 ENCOUNTER — Ambulatory Visit
Admission: RE | Admit: 2019-04-22 | Discharge: 2019-04-22 | Disposition: A | Discharge status: Home / Self Care | Payer: Medicare HMO | Source: Ambulatory Visit | Attending: Family Medicine | Admitting: Family Medicine

## 2019-04-22 VITALS — BP 134/69 | HR 59 | Temp 96.8°F | Wt 162.0 lb

## 2019-04-22 DIAGNOSIS — M25552 Pain in left hip: Secondary | ICD-10-CM | POA: Diagnosis present

## 2019-04-22 DIAGNOSIS — M25511 Pain in right shoulder: Secondary | ICD-10-CM

## 2019-04-22 DIAGNOSIS — M7541 Impingement syndrome of right shoulder: Secondary | ICD-10-CM | POA: Insufficient documentation

## 2019-04-22 DIAGNOSIS — G8929 Other chronic pain: Secondary | ICD-10-CM

## 2019-04-22 NOTE — Progress Notes (Signed)
Patient: Eileen JanskyRamona J Mack Female    DOB: 1936-01-21   83 y.o.   MRN: [id number] Visit Date: 04/22/2019  Today's Provider: Shirlee LatchAngela Milli Woolridge, MD   Chief Complaint  Patient presents with  . Hip Pain    Left hip, started about two months ago.  . Shoulder Pain    Chronic right shoulder pain for about a year.    Subjective:     Hip Pain  There was no injury mechanism. The pain is present in the left hip. The pain has been worsening since onset. Associated symptoms include an inability to bear weight and muscle weakness. Pertinent negatives include no loss of motion, numbness or tingling. The symptoms are aggravated by weight bearing and movement. She has tried acetaminophen and NSAIDs for the symptoms. The treatment provided mild relief.  Shoulder Pain  The pain is present in the right shoulder. This is a chronic problem. The problem has been unchanged. Associated symptoms include an inability to bear weight, joint locking and a limited range of motion. Pertinent negatives include no joint swelling, numbness, stiffness or tingling. The treatment provided mild relief.   Has been suffering with R shoulder pain for several years.  She has put this on the back burner as she was helping her husband. +weakness in L leg and R arm. Worse with overhead motion.  Worse with picking up anything.  Lives in an upstairs apartment and climbing the stairs has become very difficult due to L groin/hip pain. Ongoing for Couple months.  Worse with sleeping on that side.  She has tried tylenol with no relief.  Allergies  Allergen Reactions  . Iodinated Diagnostic Agents Hives, Itching and Rash    She states she has to be premedicated. SPM  . Sulfa Antibiotics Rash     Current Outpatient Medications:  .  albuterol (VENTOLIN HFA) 108 (90 Base) MCG/ACT inhaler, Inhale 2 puffs into the lungs every 6 (six) hours as needed for wheezing or shortness of breath., Disp: 18 g, Rfl: 1 .  aspirin EC 81 MG  tablet, Take 81 mg by mouth daily., Disp: , Rfl:  .  carvedilol (COREG) 25 MG tablet, Take 25 mg by mouth 2 (two) times daily with a meal., Disp: , Rfl:  .  clonazePAM (KLONOPIN) 0.5 MG tablet, Take by mouth., Disp: , Rfl:  .  cloNIDine (CATAPRES) 0.2 MG tablet, Take 0.2 mg by mouth 2 (two) times daily., Disp: , Rfl:  .  ferrous sulfate 325 (65 FE) MG tablet, Take 1 tablet (325 mg total) by mouth daily with breakfast., Disp: 60 tablet, Rfl: 3 .  gabapentin (NEURONTIN) 600 MG tablet, Take 600 mg by mouth 2 (two) times daily., Disp: , Rfl:  .  gemfibrozil (LOPID) 600 MG tablet, Take 600 mg by mouth 2 (two) times daily before a meal., Disp: , Rfl:  .  levothyroxine (SYNTHROID) 100 MCG tablet, Take 1 tablet (100 mcg total) by mouth daily before breakfast., Disp: 90 tablet, Rfl: 0 .  Multiple Vitamins-Minerals (MULTIVITAMIN GUMMIES ADULT PO), Take 1 each by mouth daily., Disp: , Rfl:  .  nitroGLYCERIN (NITROSTAT) 0.4 MG SL tablet, Place 0.4 mg under the tongue every 5 (five) minutes as needed for chest pain., Disp: , Rfl:  .  omeprazole (PRILOSEC) 20 MG capsule, Take 20 mg by mouth daily. , Disp: , Rfl:  .  pravastatin (PRAVACHOL) 40 MG tablet, Take 40 mg by mouth at bedtime., Disp: , Rfl:  .  Venlafaxine  HCl 225 MG TB24, Take 225 mg by mouth daily. , Disp: , Rfl:   Review of Systems  Constitutional: Negative.   Respiratory: Negative.   Cardiovascular: Negative.   Gastrointestinal: Negative.   Musculoskeletal: Positive for arthralgias, back pain, gait problem and joint swelling. Negative for myalgias, neck pain, neck stiffness and stiffness.  Neurological: Positive for weakness. Negative for dizziness, tingling, tremors, syncope, facial asymmetry, speech difficulty, light-headedness, numbness and headaches.  Psychiatric/Behavioral: Negative.     Social History   Tobacco Use  . Smoking status: Current Every Day Smoker    Packs/day: 1.00    Years: 62.00    Pack years: 62.00    Types:  Cigarettes  . Smokeless tobacco: Never Used  Substance Use Topics  . Alcohol use: Not Currently      Objective:   BP 134/69 (BP Location: Left Arm, Patient Position: Sitting, Cuff Size: Normal)   Pulse (!) 59   Temp (!) 96.8 F (36 C) (Temporal)   Wt 162 lb (73.5 kg)   BMI 26.15 kg/m  Vitals:   04/22/19 1051  BP: 134/69  Pulse: (!) 59  Temp: (!) 96.8 F (36 C)  TempSrc: Temporal  Weight: 162 lb (73.5 kg)  Body mass index is 26.15 kg/m.   Physical Exam Vitals reviewed.  Constitutional:      General: She is not in acute distress.    Appearance: Normal appearance. She is not diaphoretic.  HENT:     Head: Normocephalic and atraumatic.  Eyes:     General: No scleral icterus.    Conjunctiva/sclera: Conjunctivae normal.  Cardiovascular:     Rate and Rhythm: Normal rate and regular rhythm.     Pulses: Normal pulses.  Pulmonary:     Effort: Pulmonary effort is normal. No respiratory distress.  Musculoskeletal:     Right lower leg: No edema.     Left lower leg: No edema.     Comments: R shoulder: ROM limited in flexion, abduction, internal rotation.  Positive Hawkins test.  Tenderness palpation over the bicipital groove and AC joint.  No obvious deformity, erythema, swelling.  Strength intact, but pain with resisted elbow extension, internal rotation L hip: No tenderness palpation over greater trochanter or groin.  Mild tenderness palpation over ischial tuberosity.  Range of motion is normal.  No pain with internal or external rotation..  Skin:    General: Skin is warm and dry.     Findings: No rash.  Neurological:     Mental Status: She is alert and oriented to person, place, and time.     Motor: No weakness.     Gait: Gait abnormal (antalgic).      No results found for any visits on 04/22/19.     Assessment & Plan    1. Chronic right shoulder pain 2. Impingement syndrome of right shoulder -Longstanding and worsening problem for several years -She has limited  range of motion and joint line tenderness as well as impingement signs -Suspect underlying issue is arthritis -Obtain x-rays today -Avoid NSAIDs given history of PUD and GI bleeds -Continue Tylenol -Referral to orthopedics for further evaluation and management -May also benefit from physical therapy - Ambulatory referral to Orthopedic Surgery - DG Shoulder Right; Future  3. Left hip pain -New problem for a few months -Suspect underlying issue is osteoarthritis -X-rays today -Avoid NSAIDs given history of PUD and GI bleeds - Continue Tylenol -Referral to orthopedics for further evaluation and management -May also benefit from physical therapy -  Ambulatory referral to Orthopedic Surgery - DG Hip Unilat W OR W/O Pelvis 2-3 Views Left; Future    Return if symptoms worsen or fail to improve.   The entirety of the information documented in the History of Present Illness, Review of Systems and Physical Exam were personally obtained by me. Portions of this information were initially documented by Kavin Leech, CMA and reviewed by me for thoroughness and accuracy.    Lekia Nier, Marzella Schlein, MD MPH [facility]

## 2019-04-22 NOTE — Patient Instructions (Signed)
Joint Pain Joint pain (arthralgia) may be caused by many things. Joint pain is likely to go away when you follow instructions from your health care provider for relieving pain at home. However, joint pain can also be caused by conditions that require more treatment. Common causes of joint pain include:  Bruising in the area of the joint.  Injury caused by repeating certain movements too many times (overuse injury).  Age-related joint wear and tear (osteoarthritis).  Buildup of uric acid crystals in the joint (gout).  Inflammation of the joint (rheumatic disease).  Various other forms of arthritis.  Infections of the joint (septic arthritis) or of the bone (osteomyelitis). Your health care provider may recommend that you take pain medicine or wear a supportive device like an elastic bandage, sling, or splint. If your joint pain continues, you may need lab or imaging tests to diagnose the cause of your joint pain. Follow these instructions at home: Managing pain, stiffness, and swelling   If directed, put ice on the painful area. Icing can help to relieve joint pain and swelling. ? Put ice in a plastic bag. ? Place a towel between your skin and the bag. ? Leave the ice on for 20 minutes, 2-3 times a day.  If directed, apply heat to the painful area as often as told by your health care provider. Heat can reduce the stiffness of your muscles and joints. Use the heat source that your health care provider recommends, such as a moist heat pack or a heating pad. ? Place a towel between your skin and the heat source. ? Leave the heat on for 20-30 minutes. ? Remove the heat if your skin turns bright red. This is especially important if you are unable to feel pain, heat, or cold. You may have a greater risk of getting burned.  Move your fingers or toes below the painful joint often. You can avoid stiffness and lessen swelling by doing this.  If possible, raise (elevate) the painful joint above  the level of your heart while you are sitting or lying down. To do this, try putting a few pillows under the painful joint. Activity  Rest the painful joint for as long as directed. Do not do anything that causes or worsens pain.  Begin exercising or stretching the affected area, as told by your health care provider. Ask your health care provider what types of exercise are safe for you. If you have an elastic bandage, sling, or splint:  Wear the supportive device as told by your health care provider. Remove it only as told by your health care provider.  Loosen the device if your fingers or toes below the joint tingle, become numb, or turn cold and blue.  Keep the device clean.  Ask your health care provider if you should remove the device before bathing. You may need to cover it with a watertight covering when you take a bath or a shower. General instructions  Take over-the-counter and prescription medicines only as told by your health care provider.  Do not use any products that contain nicotine or tobacco, such as cigarettes and e-cigarettes. If you need help quitting, ask your health care provider.  Keep all follow-up visits as told by your health care provider. This is important. Contact a health care provider if:  You have pain that gets worse and does not get better with medicine.  Your joint pain does not improve within 3 days.  You have increased bruising or swelling.  You have a fever.  You lose 10 lb (4.5 kg) or more without trying. Get help right away if:  You cannot move the joint.  Your fingers or toes tingle, become numb, or turn cold and blue.  You have a fever along with a joint that is red, warm, and swollen. Summary  Joint pain (arthralgia) may be caused by many things.  Your health care provider may recommend that you take pain medicine or wear a supportive device like an elastic bandage, sling, or splint.  If your joint pain continues, you may need  tests to diagnose the cause of your joint pain.  Take over-the-counter and prescription medicines only as told by your health care provider. This information is not intended to replace advice given to you by your health care provider. Make sure you discuss any questions you have with your health care provider. Document Released: 04/18/2005 Document Revised: 03/31/2017 Document Reviewed: 02/01/2017 Elsevier Patient Education  2020 Elsevier Inc.  

## 2019-04-23 ENCOUNTER — Ambulatory Visit: Payer: Self-pay

## 2019-04-23 ENCOUNTER — Telehealth: Payer: Self-pay

## 2019-04-23 NOTE — Telephone Encounter (Signed)
Provided results of xray  Result notes of 04/23/19.  Pt.  Voiced understanding.

## 2019-04-23 NOTE — Telephone Encounter (Signed)
-----   Message from Virginia Crews, MD sent at 04/23/2019 10:50 AM EST ----- Severe arthritis noted in the shoulder with no acute fractures or other abnormalities

## 2019-04-23 NOTE — Telephone Encounter (Signed)
LMTCB 04/23/2019  Thanks,   -Mickel Baas

## 2019-04-23 NOTE — Telephone Encounter (Signed)
-----   Message from Virginia Crews, MD sent at 04/23/2019 10:51 AM EST ----- Mild arthritis noted in both hips and SI joints.  Plan to see orthopedics as we discussed

## 2019-05-10 ENCOUNTER — Telehealth: Payer: Self-pay

## 2019-05-10 MED ORDER — OMEPRAZOLE 20 MG PO CPDR: 20.0000 mg | DELAYED_RELEASE_CAPSULE | Freq: Every day | ORAL | 1 refills | Status: DC

## 2019-05-10 NOTE — Telephone Encounter (Signed)
Refill for Omeprazole sent to [facility]

## 2019-05-15 ENCOUNTER — Inpatient Hospital Stay
Admission: RE | Admit: 2019-05-15 | Discharge: 2019-05-15 | Disposition: A | Discharge status: Home / Self Care | Payer: Self-pay | Source: Ambulatory Visit | Attending: *Deleted | Admitting: *Deleted

## 2019-05-15 ENCOUNTER — Other Ambulatory Visit: Payer: Self-pay | Admitting: Family Medicine

## 2019-05-15 ENCOUNTER — Other Ambulatory Visit: Payer: Self-pay | Admitting: *Deleted

## 2019-05-15 DIAGNOSIS — Z1231 Encounter for screening mammogram for malignant neoplasm of breast: Secondary | ICD-10-CM

## 2019-05-15 DIAGNOSIS — N644 Mastodynia: Secondary | ICD-10-CM

## 2019-05-15 MED ORDER — PRAVASTATIN SODIUM 40 MG PO TABS: 40.0000 mg | ORAL_TABLET | Freq: Every day | ORAL | 3 refills | Status: DC

## 2019-05-15 NOTE — Telephone Encounter (Signed)
Copied from CRM [id number]. Topic: Quick Communication - Rx Refill/Question >> May 15, 2019  2:12 PM Jaquita Rector A wrote: Medication: pravastatin (PRAVACHOL) 40 MG tablet, omeprazole (PRILOSEC) 20 MG capsule Rx sent 05/10/19 not received per patient.   Patient asking if Dr can just refill all her medication so the she does not have to keep calling   Has the patient contacted their pharmacy? Yes.   (Agent: If no, request that the patient contact the pharmacy for the refill.) (Agent: If yes, when and what did the pharmacy advise?)  Preferred Pharmacy (with phone number or street name): [facility] [place], [place] - [address]  Phone:  [phone] Fax:  [phone]     Agent: Please be advised that RX refills may take up to 3 business days. We ask that you follow-up with your pharmacy.

## 2019-05-15 NOTE — Telephone Encounter (Signed)
Requested medication (s) are due for refill today- yes  Requested medication (s) are on the active medication list -yes  Future visit scheduled -yes  Last refill: unknown  Notes to clinic: Patient is requesting RF of medication prescribed by historical provider- sent for PCP review  Requested Prescriptions  Pending Prescriptions Disp Refills   pravastatin (PRAVACHOL) 40 MG tablet 30 tablet     Sig: Take 1 tablet (40 mg total) by mouth at bedtime.      Cardiovascular:  Antilipid - Statins Failed - 05/15/2019  2:29 PM      Failed - Total Cholesterol in normal range and within 360 days    No results found for: CHOL, POCCHOL, CHOLTOT        Failed - LDL in normal range and within 360 days    No results found for: LDLCALC, LDLC, HIRISKLDL, POCLDL, LDLDIRECT, REALLDLC, TOTLDLC        Failed - HDL in normal range and within 360 days    No results found for: HDL, POCHDL        Failed - Triglycerides in normal range and within 360 days    No results found for: TRIG, POCTRIG        Passed - Patient is not pregnant      Passed - Valid encounter within last 12 months    Recent Outpatient Visits           3 weeks ago Chronic right shoulder pain   [facility] [place], Dionne Bucy, MD   2 months ago Essential hypertension   [facility] [place], Dionne Bucy, MD               Refused Prescriptions Disp Refills   omeprazole (PRILOSEC) 20 MG capsule 90 capsule 1    Sig: Take 1 capsule (20 mg total) by mouth daily.      Gastroenterology: Proton Pump Inhibitors Passed - 05/15/2019  2:29 PM      Passed - Valid encounter within last 12 months    Recent Outpatient Visits           3 weeks ago Chronic right shoulder pain   [facility] [place], Dionne Bucy, MD   2 months ago Essential hypertension   [place], Dionne Bucy, MD                  Requested Prescriptions  Pending Prescriptions Disp Refills    pravastatin (PRAVACHOL) 40 MG tablet 30 tablet     Sig: Take 1 tablet (40 mg total) by mouth at bedtime.      Cardiovascular:  Antilipid - Statins Failed - 05/15/2019  2:29 PM      Failed - Total Cholesterol in normal range and within 360 days    No results found for: CHOL, POCCHOL, CHOLTOT        Failed - LDL in normal range and within 360 days    No results found for: LDLCALC, LDLC, HIRISKLDL, POCLDL, LDLDIRECT, REALLDLC, TOTLDLC        Failed - HDL in normal range and within 360 days    No results found for: HDL, POCHDL        Failed - Triglycerides in normal range and within 360 days    No results found for: TRIG, POCTRIG        Passed - Patient is not pregnant      Passed - Valid encounter within last 12 months    Recent Outpatient  Visits           3 weeks ago Chronic right shoulder pain   [facility] [place], Marzella Schlein, MD   2 months ago Essential hypertension   [facility] [place], Marzella Schlein, MD               Refused Prescriptions Disp Refills   omeprazole (PRILOSEC) 20 MG capsule 90 capsule 1    Sig: Take 1 capsule (20 mg total) by mouth daily.      Gastroenterology: Proton Pump Inhibitors Passed - 05/15/2019  2:29 PM      Passed - Valid encounter within last 12 months    Recent Outpatient Visits           3 weeks ago Chronic right shoulder pain   [facility] [place], Marzella Schlein, MD   2 months ago Essential hypertension   [facility] [place], Marzella Schlein, MD

## 2019-06-03 ENCOUNTER — Other Ambulatory Visit: Payer: Self-pay | Admitting: Family Medicine

## 2019-06-03 MED ORDER — GEMFIBROZIL 600 MG PO TABS: 600.0000 mg | ORAL_TABLET | Freq: Two times a day (BID) | ORAL | 1 refills | Status: DC

## 2019-06-03 NOTE — Telephone Encounter (Signed)
Medication Refill - Medication: gemfibrozil (LOPID) 600 MG tablet  Has the patient contacted their pharmacy? yes (Agent: If no, request that the patient contact the pharmacy for the refill.) (Agent: If yes, when and what did the pharmacy advise?)  Preferred Pharmacy (with phone number or street name):  [facility] [address] [place], [place] - [address] Phone:  [phone]  Fax:  [phone]     Agent: Please be advised that RX refills may take up to 3 business days. We ask that you follow-up with your pharmacy.

## 2019-06-03 NOTE — Telephone Encounter (Signed)
OK to fill 30 day suply with 1 refill Please abstract lipid panel from careeverywhere

## 2019-06-03 NOTE — Telephone Encounter (Signed)
Requested medication (s) are due for refill today:yes  Requested medication (s) are on the active medication list: yes  Last refill:  07/29/15  Future visit scheduled: yes  Notes to clinic:  historical provider    Requested Prescriptions  Pending Prescriptions Disp Refills   gemfibrozil (LOPID) 600 MG tablet       Sig: Take 1 tablet (600 mg total) by mouth 2 (two) times daily before a meal.      Cardiovascular:  Antilipid - Fibric Acid Derivatives Failed - 06/03/2019 10:00 AM      Failed - Total Cholesterol in normal range and within 360 days    No results found for: CHOL, POCCHOL, CHOLTOT        Failed - LDL in normal range and within 360 days    No results found for: LDLCALC, LDLC, HIRISKLDL, POCLDL, LDLDIRECT, REALLDLC, TOTLDLC        Failed - HDL in normal range and within 360 days    No results found for: HDL, POCHDL        Failed - Triglycerides in normal range and within 360 days    No results found for: TRIG, POCTRIG        Failed - Cr in normal range and within 180 days    Creatinine, Ser  Date Value Ref Range Status  01/05/2019 1.06 (H) 0.44 - 1.00 mg/dL Final          Failed - eGFR in normal range and within 180 days    GFR calc Af Amer  Date Value Ref Range Status  01/05/2019 57 (L) >60 mL/min Final   GFR calc non Af Amer  Date Value Ref Range Status  01/05/2019 50 (L) >60 mL/min Final          Passed - ALT in normal range and within 180 days    ALT  Date Value Ref Range Status  01/05/2019 11 0 - 44 U/L Final          Passed - AST in normal range and within 180 days    AST  Date Value Ref Range Status  01/05/2019 18 15 - 41 U/L Final          Passed - Valid encounter within last 12 months    Recent Outpatient Visits           1 month ago Chronic right shoulder pain   [facility] [place], Dionne Bucy, MD   3 months ago Essential hypertension   [facility] [place], Dionne Bucy, MD

## 2019-06-03 NOTE — Telephone Encounter (Signed)
Patient has lipids in care everywhere from 8/20 but none in our record

## 2019-06-05 ENCOUNTER — Telehealth: Payer: Self-pay

## 2019-06-05 NOTE — Telephone Encounter (Signed)
Copied from CRM [id number]. Topic: General - Other >> Jun 05, 2019 10:59 AM Dalphine Handing A wrote: Patient called to let Dr . B know that he found a misplaced bottle of her medication and she was sorry to bother her on yesterday

## 2019-06-28 ENCOUNTER — Ambulatory Visit: Payer: Self-pay | Admitting: Family Medicine

## 2019-07-08 ENCOUNTER — Other Ambulatory Visit: Payer: Self-pay | Admitting: Family Medicine

## 2019-07-08 NOTE — Telephone Encounter (Signed)
Requested medication (s) are due for refill today: yes  Requested medication (s) are on the active medication list: yes  Last refill:  04/15/19  Future visit scheduled: yes  Notes to clinic:  endocrinology: hypothyroid agents failed  Requested Prescriptions  Pending Prescriptions Disp Refills   levothyroxine (SYNTHROID) 100 MCG tablet [Pharmacy Med Name: Levothyroxine Sodium 100 MCG Oral Tablet] 90 tablet 0    Sig: TAKE 1 TABLET BY MOUTH ONCE DAILY BEFORE BREAKFAST      Endocrinology:  Hypothyroid Agents Failed - 07/08/2019  4:23 PM      Failed - TSH needs to be rechecked within 3 months after an abnormal result. Refill until TSH is due.      Passed - TSH in normal range and within 360 days    TSH  Date Value Ref Range Status  01/06/2019 0.397 0.350 - 4.500 uIU/mL Final    Comment:    Performed by a 3rd Generation assay with a functional sensitivity of <=0.01 uIU/mL. Performed at [facility], [address], [place], [place] [address]           Passed - Valid encounter within last 12 months    Recent Outpatient Visits           2 months ago Chronic right shoulder pain   [facility] [place], Marzella Schlein, MD   4 months ago Essential hypertension   [facility] [place], Marzella Schlein, MD

## 2019-07-24 ENCOUNTER — Telehealth: Payer: Self-pay

## 2019-07-24 NOTE — Telephone Encounter (Signed)
LMTCB to inquire about telephonic visit on 07/30/19. Direct # left to CB on.

## 2019-07-25 NOTE — Telephone Encounter (Signed)
Scheduled telephonic AWV for 07/30/19 @ 11:00 AM.

## 2019-07-29 NOTE — Progress Notes (Signed)
Pt did not answer the multiple calls made to complete the telephonic AWV. Closing note.

## 2019-07-30 ENCOUNTER — Other Ambulatory Visit: Payer: Self-pay

## 2019-07-30 NOTE — Progress Notes (Signed)
This encounter was created in error - please disregard.

## 2019-07-31 ENCOUNTER — Ambulatory Visit (INDEPENDENT_AMBULATORY_CARE_PROVIDER_SITE_OTHER): Payer: Medicare HMO | Admitting: Family Medicine

## 2019-07-31 ENCOUNTER — Encounter: Payer: Self-pay | Admitting: Family Medicine

## 2019-07-31 ENCOUNTER — Other Ambulatory Visit: Payer: Self-pay

## 2019-07-31 VITALS — BP 136/68 | HR 62 | Temp 98.2°F | Resp 16 | Ht 65.0 in | Wt 165.0 lb

## 2019-07-31 DIAGNOSIS — E782 Mixed hyperlipidemia: Secondary | ICD-10-CM

## 2019-07-31 DIAGNOSIS — I480 Paroxysmal atrial fibrillation: Secondary | ICD-10-CM

## 2019-07-31 DIAGNOSIS — F17200 Nicotine dependence, unspecified, uncomplicated: Secondary | ICD-10-CM

## 2019-07-31 DIAGNOSIS — R918 Other nonspecific abnormal finding of lung field: Secondary | ICD-10-CM

## 2019-07-31 DIAGNOSIS — I714 Abdominal aortic aneurysm, without rupture, unspecified: Secondary | ICD-10-CM

## 2019-07-31 DIAGNOSIS — R7303 Prediabetes: Secondary | ICD-10-CM

## 2019-07-31 DIAGNOSIS — Z0000 Encounter for general adult medical examination without abnormal findings: Secondary | ICD-10-CM | POA: Diagnosis not present

## 2019-07-31 DIAGNOSIS — E039 Hypothyroidism, unspecified: Secondary | ICD-10-CM

## 2019-07-31 DIAGNOSIS — M79605 Pain in left leg: Secondary | ICD-10-CM

## 2019-07-31 DIAGNOSIS — J439 Emphysema, unspecified: Secondary | ICD-10-CM

## 2019-07-31 DIAGNOSIS — I10 Essential (primary) hypertension: Secondary | ICD-10-CM

## 2019-07-31 DIAGNOSIS — D692 Other nonthrombocytopenic purpura: Secondary | ICD-10-CM | POA: Diagnosis not present

## 2019-07-31 DIAGNOSIS — D500 Iron deficiency anemia secondary to blood loss (chronic): Secondary | ICD-10-CM

## 2019-07-31 DIAGNOSIS — I2510 Atherosclerotic heart disease of native coronary artery without angina pectoris: Secondary | ICD-10-CM | POA: Diagnosis not present

## 2019-07-31 DIAGNOSIS — W19XXXA Unspecified fall, initial encounter: Secondary | ICD-10-CM

## 2019-07-31 NOTE — Assessment & Plan Note (Signed)
Reviewed last lung CT screenings Stable and benign appearing Continue annual chest CT

## 2019-07-31 NOTE — Assessment & Plan Note (Signed)
Chronic and mild No recent exacerbation On a controller Continue albuterol as needed Encouraged tobacco cessation

## 2019-07-31 NOTE — Assessment & Plan Note (Signed)
Followed by cardiology Currently in normal sinus rhythm Previously on rhythm control, but not currently She has had ablations previously She was previously on chronic anticoagulation, but it was discontinued after 2 episodes of acute severe anemia She is now having falls and therefore not a good candidate for chronic anticoagulation anyways Continue aspirin

## 2019-07-31 NOTE — Assessment & Plan Note (Signed)
Again discussed the harms of continuing to smoke and the benefits of cessation Discussed the importance of tobacco cessation in the setting of her hypertension, CAD, AAA, and other medical problems Patient remains precontemplative and not interested in quitting at this time We will continue to reassess at future visits and encourage cessation

## 2019-07-31 NOTE — Assessment & Plan Note (Signed)
Followed by Cardiology S/p 3 stent placement Asymptomatic Medically managed

## 2019-07-31 NOTE — Assessment & Plan Note (Signed)
Continue statin Goal LDL less than 70 in the setting of heart disease Recheck FLP and CMP 

## 2019-07-31 NOTE — Assessment & Plan Note (Signed)
Noted on exam, continue to monitor

## 2019-07-31 NOTE — Assessment & Plan Note (Signed)
Previously well controlled Continue Synthroid at current dose  Recheck TSH and adjust Synthroid as indicated   

## 2019-07-31 NOTE — Assessment & Plan Note (Signed)
History of bleeding in the setting of Coumadin use Recheck CBC

## 2019-07-31 NOTE — Assessment & Plan Note (Signed)
Followed by [facility] (Dr Wyn Quaker) Status post stent graft Was supposed to f/u with [facility] with repeat Duplex in 12/2018 Will order repeat Aorta duplex and have patient call Dr Wyn Quaker for f/u appt Encourage good BP control, tobacco cessation, and Statin

## 2019-07-31 NOTE — Assessment & Plan Note (Signed)
Recent fall with exacerbation of left leg pain Check x-rays of left hip and left knee given points of impact and tenderness on exam today She also has some signs on exam of ischial and greater trochanteric bursitis on the left side Consider steroid taper versus NSAIDs Given history of GI bleed, though, will need to be cautious with medications and treatment

## 2019-07-31 NOTE — Assessment & Plan Note (Signed)
Reviewed last A1c Encourage low-carb diet Repeat A1c

## 2019-07-31 NOTE — Patient Instructions (Signed)
Preventive Care 38 Years and Older, Female Preventive care refers to lifestyle choices and visits with your health care provider that can promote health and wellness. This includes:  A yearly physical exam. This is also called an annual well check.  Regular dental and eye exams.  Immunizations.  Screening for certain conditions.  Healthy lifestyle choices, such as diet and exercise. What can I expect for my preventive care visit? Physical exam Your health care provider will check:  Height and weight. These may be used to calculate body mass index (BMI), which is a measurement that tells if you are at a healthy weight.  Heart rate and blood pressure.  Your skin for abnormal spots. Counseling Your health care provider may ask you questions about:  Alcohol, tobacco, and drug use.  Emotional well-being.  Home and relationship well-being.  Sexual activity.  Eating habits.  History of falls.  Memory and ability to understand (cognition).  Work and work Statistician.  Pregnancy and menstrual history. What immunizations do I need?  Influenza (flu) vaccine  This is recommended every year. Tetanus, diphtheria, and pertussis (Tdap) vaccine  You may need a Td booster every 10 years. Varicella (chickenpox) vaccine  You may need this vaccine if you have not already been vaccinated. Zoster (shingles) vaccine  You may need this after age 33. Pneumococcal conjugate (PCV13) vaccine  One dose is recommended after age 33. Pneumococcal polysaccharide (PPSV23) vaccine  One dose is recommended after age 72. Measles, mumps, and rubella (MMR) vaccine  You may need at least one dose of MMR if you were born in 1957 or later. You may also need a second dose. Meningococcal conjugate (MenACWY) vaccine  You may need this if you have certain conditions. Hepatitis A vaccine  You may need this if you have certain conditions or if you travel or work in places where you may be exposed  to hepatitis A. Hepatitis B vaccine  You may need this if you have certain conditions or if you travel or work in places where you may be exposed to hepatitis B. Haemophilus influenzae type b (Hib) vaccine  You may need this if you have certain conditions. You may receive vaccines as individual doses or as more than one vaccine together in one shot (combination vaccines). Talk with your health care provider about the risks and benefits of combination vaccines. What tests do I need? Blood tests  Lipid and cholesterol levels. These may be checked every 5 years, or more frequently depending on your overall health.  Hepatitis C test.  Hepatitis B test. Screening  Lung cancer screening. You may have this screening every year starting at age 39 if you have a 30-pack-year history of smoking and currently smoke or have quit within the past 15 years.  Colorectal cancer screening. All adults should have this screening starting at age 36 and continuing until age 15. Your health care provider may recommend screening at age 23 if you are at increased risk. You will have tests every 1-10 years, depending on your results and the type of screening test.  Diabetes screening. This is done by checking your blood sugar (glucose) after you have not eaten for a while (fasting). You may have this done every 1-3 years.  Mammogram. This may be done every 1-2 years. Talk with your health care provider about how often you should have regular mammograms.  BRCA-related cancer screening. This may be done if you have a family history of breast, ovarian, tubal, or peritoneal cancers.  Other tests  Sexually transmitted disease (STD) testing.  Bone density scan. This is done to screen for osteoporosis. You may have this done starting at age 65. Follow these instructions at home: Eating and drinking  Eat a diet that includes fresh fruits and vegetables, whole grains, lean protein, and low-fat dairy products. Limit  your intake of foods with high amounts of sugar, saturated fats, and salt.  Take vitamin and mineral supplements as recommended by your health care provider.  Do not drink alcohol if your health care provider tells you not to drink.  If you drink alcohol: ? Limit how much you have to 0-1 drink a day. ? Be aware of how much alcohol is in your drink. In the [place] one drink equals one 12 oz bottle of beer (355 mL), one 5 oz glass of wine (148 mL), or one 1 oz glass of hard liquor (44 mL). Lifestyle  Take daily care of your teeth and gums.  Stay active. Exercise for at least 30 minutes on 5 or more days each week.  Do not use any products that contain nicotine or tobacco, such as cigarettes, e-cigarettes, and chewing tobacco. If you need help quitting, ask your health care provider.  If you are sexually active, practice safe sex. Use a condom or other form of protection in order to prevent STIs (sexually transmitted infections).  Talk with your health care provider about taking a low-dose aspirin or statin. What's next?  Go to your health care provider once a year for a well check visit.  Ask your health care provider how often you should have your eyes and teeth checked.  Stay up to date on all vaccines. This information is not intended to replace advice given to you by your health care provider. Make sure you discuss any questions you have with your health care provider. Document Revised: 04/12/2018 Document Reviewed: 04/12/2018 Elsevier Patient Education  2020 Elsevier Inc.   

## 2019-07-31 NOTE — Assessment & Plan Note (Signed)
Well controlled Continue current medications Recheck metabolic panel F/u in 6 months  

## 2019-07-31 NOTE — Progress Notes (Signed)
Patient: Eileen Mack, Female    DOB: [birth year], 84 y.o.   MRN: [id number] Visit Date: 07/31/2019  Today's Provider: Shirlee LatchAngela Greogory Cornette, MD   Chief Complaint  Patient presents with  . Annual Exam   Subjective:     Complete Physical Eileen Mack is a 84 y.o. female. She feels well. She reports exercising no. She reports she is sleeping well. 07/24/2019 AWV with McKenzie 08/01/2015 Colonoscopy-polyps, diverticulosis 08/01/2015 Pathology-hyperplastic polyp ----------------------------------------------------------- L leg pain Hurts if sitting for long periods of time.  Painful when stands from sitting.  Makes sciatica worse. More difficult to pick it up to get in the car. Larey SeatFell down a flight of 20 stairs at her apartment and slipped on icy stairs last month. Has now moved to a downstairs apartment No evaluation after that fall Fell onto back and L side Leg hurting much worse since the fall Reports weakness and feeling as if it will give way No numbness   Review of Systems  Constitutional: Negative.   HENT: Negative.   Eyes: Negative.   Respiratory: Negative.   Cardiovascular: Negative.   Gastrointestinal: Negative.   Endocrine: Negative.   Genitourinary: Negative.   Musculoskeletal: Negative.   Skin: Negative.   Allergic/Immunologic: Negative.   Neurological: Negative.   Hematological: Negative.   Psychiatric/Behavioral: Negative.     Social History   Socioeconomic History  . Marital status: Widowed    Spouse name: Not on file  . Number of children: Not on file  . Years of education: Not on file  . Highest education level: Not on file  Occupational History  . Not on file  Tobacco Use  . Smoking status: Current Every Day Smoker    Packs/day: 1.00    Years: 62.00    Pack years: 62.00    Types: Cigarettes  . Smokeless tobacco: Never Used  Substance and Sexual Activity  . Alcohol use: Not Currently  . Drug use: Never  . Sexual  activity: Not Currently  Other Topics Concern  . Not on file  Social History Narrative  . Not on file   Social Determinants of Health   Financial Resource Strain:   . Difficulty of Paying Living Expenses:   Food Insecurity:   . Worried About Programme researcher, broadcasting/film/videounning Out of Food in the Last Year:   . Baristaan Out of Food in the Last Year:   Transportation Needs:   . Freight forwarderLack of Transportation (Medical):   Marland Kitchen. Lack of Transportation (Non-Medical):   Physical Activity:   . Days of Exercise per Week:   . Minutes of Exercise per Session:   Stress:   . Feeling of Stress :   Social Connections:   . Frequency of Communication with Friends and Family:   . Frequency of Social Gatherings with Friends and Family:   . Attends Religious Services:   . Active Member of Clubs or Organizations:   . Attends BankerClub or Organization Meetings:   Marland Kitchen. Marital Status:   Intimate Partner Violence:   . Fear of Current or Ex-Partner:   . Emotionally Abused:   Marland Kitchen. Physically Abused:   . Sexually Abused:     Past Medical History:  Diagnosis Date  . Allergy   . Anemia   . Atrial fibrillation (HCC)   . Blood transfusion without reported diagnosis   . COPD (chronic obstructive pulmonary disease) (HCC)   . Depression   . Emphysema of lung (HCC)   . GERD (gastroesophageal reflux disease)   .  History of blood clots   . Hyperlipidemia   . Hypertension   . Personal history of tobacco use, presenting hazards to health 07/01/2015  . Renal disorder   . Thyroid disease      Patient Active Problem List   Diagnosis Date Noted  . Prediabetes 07/31/2019  . Chronic right shoulder pain 02/26/2019  . Senile purpura (HCC) 02/26/2019  . Breast pain, left 02/26/2019  . Renal mass 02/26/2019  . COPD (chronic obstructive pulmonary disease) (HCC) 01/06/2019  . AAA (abdominal aortic aneurysm) without rupture (HCC) 10/10/2017  . Varicose veins of both lower extremities with pain 10/10/2017  . Claudication (HCC) 10/10/2017  . Hyperlipemia, mixed  08/14/2017  . Neuropathy 08/14/2017  . Depression, prolonged 08/14/2017  . Cervical disc disease 08/14/2017  . Diarrhea 08/14/2017  . History of GI bleed 08/14/2017  . Multiple pulmonary nodules 04/29/2016  . Iron deficiency anemia due to chronic blood loss 07/29/2015  . Tobacco dependence 07/01/2015  . PUD (peptic ulcer disease) 06/18/2014  . Bigeminy 09/03/2013  . Onychomycosis 02/25/2013  . Hallux valgus with bunions 01/14/2013  . Hammertoe 01/14/2013  . DDD (degenerative disc disease), lumbar 08/09/2012  . Coronary artery disease involving native coronary artery of native heart without angina pectoris 08/03/2012  . Hypothyroidism 07/02/2012  . GERD (gastroesophageal reflux disease) 07/02/2012  . Cervical spondylosis without myelopathy 07/21/2011  . Sciatica 07/21/2011  . Left leg pain 12/22/2010  . Hypertension, essential 11/26/2010  . Paroxysmal atrial fibrillation (HCC) 11/26/2010    Past Surgical History:  Procedure Laterality Date  . ABDOMINAL HYSTERECTOMY    . APPENDECTOMY    . CARDIAC SURGERY    . CHOLECYSTECTOMY    . COLONOSCOPY WITH PROPOFOL Bilateral 08/01/2015   Procedure: COLONOSCOPY WITH PROPOFOL;  Surgeon: Scot Jun, MD;  [facility] ENDOSCOPY;  Service: Endoscopy;  Laterality: Bilateral;  . ENDOVASCULAR REPAIR/STENT GRAFT N/A 02/21/2018   Procedure: ENDOVASCULAR REPAIR/STENT GRAFT;  Surgeon: Renford Dills, MD;  Location: [facility] INVASIVE CV LAB;  Service: Cardiovascular;  Laterality: N/A;  . ESOPHAGOGASTRODUODENOSCOPY N/A 07/31/2015   Procedure: ESOPHAGOGASTRODUODENOSCOPY (EGD);  Surgeon: Scot Jun, MD;  Location: [facility] ENDOSCOPY;  Service: Endoscopy;  Laterality: N/A;  . EYE SURGERY      Her family history includes Brain cancer in her sister; Heart attack (age of onset: 16) in her father; Lung cancer in her sister. There is no history of Breast cancer or Colon cancer.   Current Outpatient Medications:  .  albuterol (VENTOLIN HFA) 108 (90  Base) MCG/ACT inhaler, Inhale 2 puffs into the lungs every 6 (six) hours as needed for wheezing or shortness of breath., Disp: 18 g, Rfl: 1 .  aspirin EC 81 MG tablet, Take 81 mg by mouth daily., Disp: , Rfl:  .  carvedilol (COREG) 25 MG tablet, Take 25 mg by mouth 2 (two) times daily with a meal., Disp: , Rfl:  .  cloNIDine (CATAPRES) 0.2 MG tablet, Take 0.2 mg by mouth 2 (two) times daily., Disp: , Rfl:  .  ferrous sulfate 325 (65 FE) MG tablet, Take 1 tablet (325 mg total) by mouth daily with breakfast., Disp: 60 tablet, Rfl: 3 .  gabapentin (NEURONTIN) 600 MG tablet, Take 600 mg by mouth 2 (two) times daily., Disp: , Rfl:  .  gemfibrozil (LOPID) 600 MG tablet, Take 1 tablet (600 mg total) by mouth 2 (two) times daily before a meal., Disp: 60 tablet, Rfl: 1 .  levothyroxine (SYNTHROID) 100 MCG tablet, TAKE 1 TABLET BY MOUTH ONCE  DAILY BEFORE BREAKFAST, Disp: 90 tablet, Rfl: 1 .  lisinopril (ZESTRIL) 20 MG tablet, Take by mouth., Disp: , Rfl:  .  Multiple Vitamins-Minerals (MULTIVITAMIN GUMMIES ADULT PO), Take 1 each by mouth daily., Disp: , Rfl:  .  nitroGLYCERIN (NITROSTAT) 0.4 MG SL tablet, Place 0.4 mg under the tongue every 5 (five) minutes as needed for chest pain., Disp: , Rfl:  .  omeprazole (PRILOSEC) 20 MG capsule, Take 1 capsule (20 mg total) by mouth daily., Disp: 90 capsule, Rfl: 1 .  pravastatin (PRAVACHOL) 40 MG tablet, Take 1 tablet (40 mg total) by mouth at bedtime., Disp: 30 tablet, Rfl: 3 .  Venlafaxine HCl 225 MG TB24, Take 225 mg by mouth daily. , Disp: , Rfl:  .  clonazePAM (KLONOPIN) 0.5 MG tablet, Take by mouth., Disp: , Rfl:   Patient Care Team: Erasmo Downer, MD as PCP - General (Family Medicine)     Objective:    Vitals: BP 136/68 (BP Location: Left Arm, Patient Position: Sitting, Cuff Size: Large)   Pulse 62   Temp 98.2 F (36.8 C) (Temporal)   Resp 16   Ht 5\' 5"  (1.651 m)   Wt 165 lb (74.8 kg)   BMI 27.46 kg/m   Physical Exam Vitals reviewed.    Constitutional:      General: She is not in acute distress.    Appearance: Normal appearance. She is well-developed. She is not diaphoretic.  HENT:     Head: Normocephalic and atraumatic.     Right Ear: Tympanic membrane, ear canal and external ear normal.     Left Ear: Tympanic membrane, ear canal and external ear normal.  Eyes:     General: No scleral icterus.    Conjunctiva/sclera: Conjunctivae normal.     Pupils: Pupils are equal, round, and reactive to light.  Neck:     Thyroid: No thyromegaly.  Cardiovascular:     Rate and Rhythm: Normal rate and regular rhythm.     Pulses: Normal pulses.     Heart sounds: Murmur present.  Pulmonary:     Effort: Pulmonary effort is normal. No respiratory distress.     Breath sounds: Normal breath sounds. No wheezing or rales.  Abdominal:     General: There is no distension.     Palpations: Abdomen is soft.     Tenderness: There is no abdominal tenderness. There is no guarding or rebound.  Musculoskeletal:        General: No deformity.     Cervical back: Neck supple.     Right lower leg: No edema.     Left lower leg: No edema.     Comments: L knee: Normal range of motion.  No obvious deformity.  No swelling noted.  Pain with terminal extension and posterior knee.  No joint line tenderness.  Ligaments intact with solid endpoints. Left hip: No SI joint tenderness to palpation.  Normal range of motion.  No pain with internal or external rotation.  Tenderness to palpation over ischium and greater trochanter.  Lymphadenopathy:     Cervical: No cervical adenopathy.  Skin:    General: Skin is warm and dry.     Findings: No rash.  Neurological:     Mental Status: She is alert and oriented to person, place, and time. Mental status is at baseline.     Sensory: No sensory deficit.     Motor: No weakness.     Gait: Gait abnormal (antalgic).  Psychiatric:  Mood and Affect: Mood normal.        Behavior: Behavior normal.        Thought  Content: Thought content normal.     Activities of Daily Living In your present state of health, do you have any difficulty performing the following activities: 07/31/2019 02/26/2019  Hearing? Malvin Johns  Vision? N Y  Comment - -  Difficulty concentrating or making decisions? Malvin Johns  Walking or climbing stairs? Y Y  Dressing or bathing? N N  Doing errands, shopping? N N  Some recent data might be hidden    Fall Risk Assessment Fall Risk  07/31/2019 02/26/2019  Falls in the past year? 1 0  Number falls in past yr: 0 0  Injury with Fall? 0 0  Risk for fall due to : No Fall Risks -  Follow up Falls evaluation completed Falls evaluation completed     Depression Screen PHQ 2/9 Scores 07/31/2019 02/26/2019  PHQ - 2 Score 1 0  PHQ- 9 Score 2 3   6CIT Screen 07/31/2019  What Year? 0 points  What month? 0 points  What time? 0 points  Count back from 20 0 points  Months in reverse 4 points  Repeat phrase 2 points  Total Score 6   Audit-C Alcohol Use Screening   Alcohol Use Disorder Test (AUDIT) 02/26/2019 07/31/2019  1. How often do you have a drink containing alcohol? 0 0  2. How many drinks containing alcohol do you have on a typical day when you are drinking? 0 0  3. How often do you have six or more drinks on one occasion? 0 0  AUDIT-C Score 0 0  Alcohol Brief Interventions/Follow-up - AUDIT Score <7 follow-up not indicated    A score of 3 or more in women, and 4 or more in men indicates increased risk for alcohol abuse, EXCEPT if all of the points are from question 1    Assessment & Plan:    Annual Physical Reviewed patient's Family Medical History Reviewed and updated list of patient's medical providers Assessment of cognitive impairment was done Assessed patient's functional ability Established a written schedule for health screening services Health Risk Assessent Completed and Reviewed  Exercise Activities and Dietary recommendations Goals   None     Immunization  History  Administered Date(s) Administered  . Fluad Quad(high Dose 65+) 02/26/2019  . Influenza, High Dose Seasonal PF 05/15/2015, 02/04/2017, 02/20/2018  . Influenza-Unspecified 01/25/2011, 02/13/2012, 01/22/2013, 02/05/2014, 05/15/2015, 02/25/2016, 02/04/2017  . Pneumococcal Conjugate-13 12/17/2014  . Pneumococcal Polysaccharide-23 06/17/2003  . Tetanus 05/25/2009  . Zoster Recombinat (Shingrix) 02/14/2019    Health Maintenance  Topic Date Due  . Janet Berlin  05/26/2019  . INFLUENZA VACCINE  Completed  . DEXA SCAN  Completed  . PNA vac Low Risk Adult  Completed     Discussed health benefits of physical activity, and encouraged her to engage in regular exercise appropriate for her age and condition.    ------------------------------------------------------------------------------------------------------------  Problem List Items Addressed This Visit      Cardiovascular and Mediastinum   Hypertension, essential    Well controlled Continue current medications Recheck metabolic panel F/u in 6 months       Relevant Medications   lisinopril (ZESTRIL) 20 MG tablet   Other Relevant Orders   Comprehensive metabolic panel   AAA (abdominal aortic aneurysm) without rupture (HCC)    Followed by [facility] (Dr Wyn Quaker) Status post stent graft Was supposed to f/u with [facility] with repeat Duplex in  12/2018 Will order repeat Aorta duplex and have patient call Dr Lucky Cowboy for f/u appt Encourage good BP control, tobacco cessation, and Statin      Relevant Medications   lisinopril (ZESTRIL) 20 MG tablet   Other Relevant Orders   US AORTA DUPLEX COMPLETE   Paroxysmal atrial fibrillation ([place])    Followed by cardiology Currently in normal sinus rhythm Previously on rhythm control, but not currently She has had ablations previously She was previously on chronic anticoagulation, but it was discontinued after 2 episodes of acute severe anemia She is now having falls and therefore not a good candidate  for chronic anticoagulation anyways Continue aspirin       Relevant Medications   lisinopril (ZESTRIL) 20 MG tablet   Senile purpura ([place])    Noted on exam, continue to monitor      Relevant Medications   lisinopril (ZESTRIL) 20 MG tablet   Coronary artery disease involving native coronary artery of native heart without angina pectoris    Followed by Cardiology S/p 3 stent placement Asymptomatic Medically managed      Relevant Medications   lisinopril (ZESTRIL) 20 MG tablet     Respiratory   COPD (chronic obstructive pulmonary disease) (HCC)    Chronic and mild No recent exacerbation On a controller Continue albuterol as needed Encouraged tobacco cessation        Endocrine   Hypothyroidism    Previously well controlled Continue Synthroid at current dose  Recheck TSH and adjust Synthroid as indicated        Relevant Orders   TSH     Other   Tobacco dependence    Again discussed the harms of continuing to smoke and the benefits of cessation Discussed the importance of tobacco cessation in the setting of her hypertension, CAD, AAA, and other medical problems Patient remains precontemplative and not interested in quitting at this time We will continue to reassess at future visits and encourage cessation      Iron deficiency anemia due to chronic blood loss    History of bleeding in the setting of Coumadin use Recheck CBC      Relevant Orders   CBC   Hyperlipemia, mixed    Continue statin Goal LDL less than 70 in the setting of heart disease Recheck F LP and CMP      Relevant Medications   lisinopril (ZESTRIL) 20 MG tablet   Other Relevant Orders   Comprehensive metabolic panel   Lipid panel   Left leg pain    Recent fall with exacerbation of left leg pain Check x-rays of left hip and left knee given points of impact and tenderness on exam today She also has some signs on exam of ischial and greater trochanteric bursitis on the left side Consider  steroid taper versus NSAIDs Given history of GI bleed, though, will need to be cautious with medications and treatment      Relevant Orders   DG Hip Unilat W OR W/O Pelvis 2-3 Views Left   DG Knee Complete 4 Views Left   Multiple pulmonary nodules    Reviewed last lung CT screenings Stable and benign appearing Continue annual chest CT      Prediabetes    Reviewed last A1c Encourage low-carb diet Repeat A1c      Relevant Orders   Hemoglobin A1c    Other Visit Diagnoses    Encounter for annual wellness visit (AWV) in Medicare patient    -  Primary   Encounter  for annual physical exam       Relevant Orders   Comprehensive metabolic panel   Hemoglobin A1c   CBC   Lipid panel   TSH   Fall, initial encounter       Relevant Orders   DG Hip Unilat W OR W/O Pelvis 2-3 Views Left   DG Knee Complete 4 Views Left       Return in about 6 months (around 01/30/2020) for chronic disease f/u.   The entirety of the information documented in the History of Present Illness, Review of Systems and Physical Exam were personally obtained by me. Portions of this information were initially documented by Rondel Baton, CMA and reviewed by me for thoroughness and accuracy.    Naylee Frankowski, Marzella Schlein, MD MPH [facility]

## 2019-08-06 ENCOUNTER — Ambulatory Visit: Admission: RE | Admit: 2019-08-06 | Payer: Medicare HMO | Source: Ambulatory Visit

## 2019-08-12 ENCOUNTER — Other Ambulatory Visit: Payer: Self-pay | Admitting: Oncology

## 2019-08-12 ENCOUNTER — Other Ambulatory Visit: Payer: Self-pay | Admitting: Family Medicine

## 2019-08-12 DIAGNOSIS — R911 Solitary pulmonary nodule: Secondary | ICD-10-CM

## 2019-08-12 NOTE — Progress Notes (Signed)
  Pulmonary Nodule Clinic Telephone Note [facility]   Received referral from [place], LDCT screening program.   HPI: Patient has past medical history significant for smoking and was evaluated in the low-dose CT screening program for many years.  Unfortunately, she no longer meets criteria due to her age being over 84 years old.  Review and Recommendations: I personally reviewed all patient's previous imaging including low-dose CT screening from October 2020 which revealed numerous scattered pulmonary nodules that appeared stable.  There are no new significant pulmonary nodules.  A 52-month follow-up was recommended per radiologist.  I recommend follow-up with non-contrast CT scan of the chest in approximately 7 months.  This would be around October 2021; 1 year from previous.  Social History: She is a current everyday smoker.  Has a 62-pack-year smoking history.  High risk factors include: History of heavy smoking, exposure to asbestos, radium or uranium, personal family history of lung cancer, older age, sex (females greater than males), race (black and native [place] greater than weight), marginal speculation, upper lobe location, multiplicity (less than 5 nodules increases risk for malignancy) and emphysema and/or pulmonary fibrosis.   This recommendation follows the consensus statement: Guidelines for Management of Incidental Pulmonary Nodules Detected on CT Images: From the Fleischner Society 2017; Radiology 2017; 284:228-243.    I have placed order for CT scan without contrast to be completed approximately 12 months from previous.  Disposition: Order placed for repeat CT chest. Will notify Micael Hampshire in scheduling. Hayley Rhode to call patient with appointment date and time. Return to pulmonary nodule clinic a few days after his repeat imaging to discuss results and plan moving forward.  Durenda Hurt, NP 08/12/2019 1:01 PM

## 2019-08-20 ENCOUNTER — Ambulatory Visit
Admission: RE | Admit: 2019-08-20 | Discharge: 2019-08-20 | Disposition: A | Discharge status: Home / Self Care | Payer: Medicare HMO | Source: Ambulatory Visit | Attending: Family Medicine | Admitting: Family Medicine

## 2019-08-20 ENCOUNTER — Other Ambulatory Visit: Payer: Self-pay

## 2019-08-20 DIAGNOSIS — M25552 Pain in left hip: Secondary | ICD-10-CM | POA: Diagnosis not present

## 2019-08-20 DIAGNOSIS — M79605 Pain in left leg: Secondary | ICD-10-CM | POA: Diagnosis not present

## 2019-08-20 DIAGNOSIS — W19XXXA Unspecified fall, initial encounter: Secondary | ICD-10-CM | POA: Diagnosis not present

## 2019-08-20 DIAGNOSIS — M25562 Pain in left knee: Secondary | ICD-10-CM | POA: Diagnosis present

## 2019-08-20 DIAGNOSIS — M1712 Unilateral primary osteoarthritis, left knee: Secondary | ICD-10-CM | POA: Diagnosis not present

## 2019-08-21 ENCOUNTER — Telehealth: Payer: Self-pay

## 2019-08-21 DIAGNOSIS — M79605 Pain in left leg: Secondary | ICD-10-CM

## 2019-08-21 DIAGNOSIS — M25552 Pain in left hip: Secondary | ICD-10-CM

## 2019-08-21 DIAGNOSIS — D500 Iron deficiency anemia secondary to blood loss (chronic): Secondary | ICD-10-CM

## 2019-08-21 LAB — LIPID PANEL
Chol/HDL Ratio: 2.8 ratio (ref 0.0–4.4)
Cholesterol, Total: 150 mg/dL (ref 100–199)
HDL: 53 mg/dL (ref >39)
LDL Chol Calc (NIH): 84 mg/dL (ref 0–99)
Triglycerides: 62 mg/dL (ref 0–149)
VLDL Cholesterol Cal: 13 mg/dL (ref 5–40)

## 2019-08-21 LAB — CBC
Hematocrit: 32.6 % — ABNORMAL LOW (ref 34.0–46.6)
Hemoglobin: 10.9 g/dL — ABNORMAL LOW (ref 11.1–15.9)
MCH: 30.6 pg (ref 26.6–33.0)
MCHC: 33.4 g/dL (ref 31.5–35.7)
MCV: 92 fL (ref 79–97)
Platelets: 247 10*3/uL (ref 150–450)
RBC: 3.56 x10E6/uL — ABNORMAL LOW (ref 3.77–5.28)
RDW: 13.5 % (ref 11.7–15.4)
WBC: 4.4 10*3/uL (ref 3.4–10.8)

## 2019-08-21 LAB — COMPREHENSIVE METABOLIC PANEL
ALT: 9 IU/L (ref 0–32)
AST: 19 IU/L (ref 0–40)
Albumin/Globulin Ratio: 1.8 (ref 1.2–2.2)
Albumin: 4.4 g/dL (ref 3.6–4.6)
Alkaline Phosphatase: 118 IU/L — ABNORMAL HIGH (ref 39–117)
BUN/Creatinine Ratio: 15 (ref 12–28)
BUN: 15 mg/dL (ref 8–27)
Bilirubin Total: 0.2 mg/dL (ref 0.0–1.2)
CO2: 24 mmol/L (ref 20–29)
Calcium: 9.5 mg/dL (ref 8.7–10.3)
Chloride: 103 mmol/L (ref 96–106)
Creatinine, Ser: 1 mg/dL (ref 0.57–1.00)
GFR calc Af Amer: 61 mL/min/{1.73_m2} (ref >59)
GFR calc non Af Amer: 53 mL/min/{1.73_m2} — ABNORMAL LOW (ref >59)
Globulin, Total: 2.4 g/dL (ref 1.5–4.5)
Glucose: 110 mg/dL — ABNORMAL HIGH (ref 65–99)
Potassium: 4.4 mmol/L (ref 3.5–5.2)
Sodium: 142 mmol/L (ref 134–144)
Total Protein: 6.8 g/dL (ref 6.0–8.5)

## 2019-08-21 LAB — HEMOGLOBIN A1C
Est. average glucose Bld gHb Est-mCnc: 123 mg/dL
Hgb A1c MFr Bld: 5.9 % — ABNORMAL HIGH (ref 4.8–5.6)

## 2019-08-21 LAB — TSH: TSH: 1.74 u[IU]/mL (ref 0.450–4.500)

## 2019-08-21 MED ORDER — EPINEPHRINE 0.3 MG/0.3ML IJ SOAJ: 0.3000 mg | INTRAMUSCULAR | 0 refills | Status: DC | PRN

## 2019-08-21 NOTE — Telephone Encounter (Signed)
-----   Message from Erasmo Downer, MD sent at 08/21/2019  8:10 AM EDT ----- Normal hip XRay

## 2019-08-21 NOTE — Telephone Encounter (Signed)
-----   Message from Erasmo Downer, MD sent at 08/21/2019  8:11 AM EDT ----- Severe arthritis in knee  no fracture or acute problem

## 2019-08-21 NOTE — Telephone Encounter (Signed)
NA. Voicemail not set up.  

## 2019-08-21 NOTE — Telephone Encounter (Signed)
Tried calling; pt's voicemail is not set up.  PEC please advise pt of x-ray results below.   Thanks,   -Vernona Rieger

## 2019-08-21 NOTE — Telephone Encounter (Signed)
Can take Aleve or tylenol prn.  Can see Ortho about the knee as well if she would like - we can place referral.  Epi pen ordered

## 2019-08-21 NOTE — Telephone Encounter (Signed)
Reviewed results and physician's note with the patient. She wants to know what she can do for the arthritis knee pain and the hip pain? She has not tried any treatments or OTC medications for the pain.  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 Pharmacy on file is current.  Routing to provider.

## 2019-08-22 NOTE — Telephone Encounter (Signed)
NA. Voicemail not set up.  

## 2019-08-22 NOTE — Telephone Encounter (Signed)
-----   Message from Erasmo Downer, MD sent at 08/21/2019 11:32 AM EDT ----- Normal/stable labs.  Has anemia been worked up

## 2019-08-23 NOTE — Telephone Encounter (Signed)
NA. Voicemail not set up.  

## 2019-08-23 NOTE — Telephone Encounter (Signed)
NA

## 2019-08-23 NOTE — Telephone Encounter (Signed)
Patient advised as below. Patient reports she has never had anemia worked up. Patient reports she would like to be referred to ortho.

## 2019-08-26 NOTE — Telephone Encounter (Signed)
OK to refer to hematology for the anemia and Ortho for the hip and knee pain

## 2019-09-17 ENCOUNTER — Telehealth: Payer: Self-pay | Admitting: Family Medicine

## 2019-09-17 NOTE — Telephone Encounter (Signed)
Copied from CRM [id number]. Topic: Quick Communication - Rx Refill/Question >> Sep 17, 2019  9:55 AM Dalphine Handing A wrote: Medication: All medications (Patient could not recall the names of her medications)  Has the patient contacted their pharmacy? yes (Agent: If no, request that the patient contact the pharmacy for the refill.) (Agent: If yes, when and what did the pharmacy advise?)contact pcp  Preferred Pharmacy (with phone number or street name): [facility] [place], [place] - [address]  Phone:  [phone] Fax:  [phone]     Agent: Please be advised that RX refills may take up to 3 business days. We ask that you follow-up with your pharmacy.

## 2019-09-17 NOTE — Telephone Encounter (Signed)
Patient is requesting RF of all medications- sent to office for review of request- non specific request

## 2019-09-18 ENCOUNTER — Ambulatory Visit: Payer: Self-pay

## 2019-09-18 NOTE — Telephone Encounter (Signed)
If she just wants refill, we can send it in without virtual visit.  That is fine

## 2019-09-18 NOTE — Telephone Encounter (Signed)
Patient called to inquire of Dr B can she please give her a quick call today to discuss the issues that she is having since she have been off the Venlafaxine HCl 225 MG TB24. Per patient she is in desperate need of a call back from Dr B. Can be reached at Ph# [phone]

## 2019-09-18 NOTE — Telephone Encounter (Signed)
Patient has scheduled appt on 09/19/19

## 2019-09-18 NOTE — Telephone Encounter (Signed)
FYI... Pt has phone visit  09/20/2019   Thanks,   -Vernona Rieger

## 2019-09-18 NOTE — Telephone Encounter (Signed)
Patient called stating that she needs her venlafaxine refilled.  She states that she is having nightmare for the last 2-3 nights. She is tearful and upset. She states that she has been on this medication for years and was placed on it to help with anxiety and nightmares. She denies suicidal/ homicidal thoughts.  She states that she seen a psychiatrist in the past but does not wish to see one again.  She states that she has been good and just feels she need to have her medication refilled.  Per office note patient needs appointment for refill. Virtual OV was scheduled for tomorrow. DT was completed.  Reason for Disposition . Patient sounds very upset or troubled to the triager  Answer Assessment - Initial Assessment Questions 1. CONCERN: "What happened that made you call today?"     Nightmares after being out of medication Venlafaxine 2. ANXIETY SYMPTOM SCREENING: "Can you describe how you have been feeling?"  (e.g., tense, restless, panicky, anxious, keyed up, trouble sleeping, trouble concentrating)    Night mares started 3-4 nights ago 3. ONSET: "How long have you been feeling this way?"    3-4 nights ago 4. RECURRENT: "Have you felt this way before?"  If yes: "What happened that time?" "What helped these feelings go away in the past?"      Yes there reason for venlafaxine 5. RISK OF HARM - SUICIDAL IDEATION:  "Do you ever have thoughts of hurting or killing yourself?"  (e.g., yes, no, no but preoccupation with thoughts about death)   - INTENT:  "Do you have thoughts of hurting or killing yourself right NOW?" (e.g., yes, no, N/A)   - PLAN: "Do you have a specific plan for how you would do this?" (e.g., gun, knife, overdose, no plan, N/A)     no 6. RISK OF HARM - HOMICIDAL IDEATION:  "Do you ever have thoughts of hurting or killing someone else?"  (e.g., yes, no, no but preoccupation with thoughts about death)   - INTENT:  "Do you have thoughts of hurting or killing someone right NOW?" (e.g., yes,  no, N/A)   - PLAN: "Do you have a specific plan for how you would do this?" (e.g., gun, knife, no plan, N/A)     no 7. FUNCTIONAL IMPAIRMENT: "How have things been going for you overall in your life? Have you had any more difficulties than usual doing your normal daily activities?"  (e.g., better, same, worse; self-care, school, work, interactions)     Worse because of nightmares 8. SUPPORT: "Who is with you now?" "Who do you live with?" "Do you have family or friends nearby who you can talk to?"     none 9. THERAPIST: "Do you have a counselor or therapist? Name?"    Yes years back 10. STRESSORS: "Has there been any new stress or recent changes in your life?"     Disabled daughter 18. CAFFEINE ABUSE: "Do you drink caffeinated beverages, and how much each day?" (e.g., coffee, tea, colas)       Coffee 2 cups in am sometimes in afternoon 1cup 12. SUBSTANCE ABUSE: "Do you use any illegal drugs or alcohol?"       No 13. OTHER SYMPTOMS: "Do you have any other physical symptoms right now?" (e.g., chest pain, palpitations, difficulty breathing, fever)      nightmares 14. PREGNANCY: "Is there any chance you are pregnant?" "When was your last menstrual period?"     N/A  Protocols used: ANXIETY AND PANIC ATTACK-A-AH

## 2019-09-18 NOTE — Telephone Encounter (Signed)
Pt needs to schedule a telephone visit with Dr. Leonard Schwartz.  Left message advising pt.  PEC please schedule pt when she calls back.   Thanks,   -Vernona Rieger

## 2019-09-19 ENCOUNTER — Telehealth (INDEPENDENT_AMBULATORY_CARE_PROVIDER_SITE_OTHER): Payer: Medicare HMO | Admitting: Family Medicine

## 2019-09-19 DIAGNOSIS — Z8669 Personal history of other diseases of the nervous system and sense organs: Secondary | ICD-10-CM

## 2019-09-19 DIAGNOSIS — M1990 Unspecified osteoarthritis, unspecified site: Secondary | ICD-10-CM | POA: Diagnosis not present

## 2019-09-19 DIAGNOSIS — F32A Depression, unspecified: Secondary | ICD-10-CM

## 2019-09-19 DIAGNOSIS — M159 Polyosteoarthritis, unspecified: Secondary | ICD-10-CM

## 2019-09-19 DIAGNOSIS — F329 Major depressive disorder, single episode, unspecified: Secondary | ICD-10-CM

## 2019-09-19 DIAGNOSIS — M8949 Other hypertrophic osteoarthropathy, multiple sites: Secondary | ICD-10-CM

## 2019-09-19 MED ORDER — VENLAFAXINE HCL ER 225 MG PO TB24: 225.0000 mg | ORAL_TABLET | Freq: Every day | ORAL | 1 refills | Status: DC

## 2019-09-19 NOTE — Assessment & Plan Note (Addendum)
Longstanding issue Worsening since being out of Effexor due to refill issues (pharmacy still sending to previous PCP) Previously well controlled on Effexor - has tried to taper and wean in the past with recurrence of symptoms Will resume Effexor XR 225 mg daily that she was previously well controlled on Contracted for safety - no SI/HI Repeat PHQ9 at next visit

## 2019-09-19 NOTE — Assessment & Plan Note (Signed)
Patient reports recent UC visit for middle ear effusion She was treated with abx for sinusitis Continues to have intermittent ear pain Has h/o recurrent AOM Wishes to see ENT - referral placed today

## 2019-09-19 NOTE — Progress Notes (Signed)
Virtual telephone visit    Virtual Visit via Telephone Note   This visit type was conducted due to national recommendations for restrictions regarding the COVID-19 Pandemic (e.g. social distancing) in an effort to limit this patient's exposure and mitigate transmission in our community. Due to her co-morbid illnesses, this patient is at least at moderate risk for complications without adequate follow up. This format is felt to be most appropriate for this patient at this time. The patient did not have access to video technology or had technical difficulties with video requiring transitioning to audio format only (telephone). Physical exam was limited to content and character of the telephone converstion.     Patient location: home Provider location: [place] involved in the visit: patient, provider   Visit Date: 09/19/2019  Today's healthcare provider: Lavon Paganini, MD   Chief Complaint  Patient presents with  . Depression   Subjective    HPI Depression, Follow-up  She  was last seen for this 2 months ago. Changes made at last visit include no changes.   She reports excellent compliance with treatment. She is having side effects. nightmares  She reports poor tolerance of treatment. Current symptoms include: insomnia She feels she is Unchanged since last visit.  Has not had for 6-7 days.  Has taken Effexor for many years.  Doing worse since running out.  No SI/HI  Depression screen [facility] 2/9 09/19/2019 07/31/2019 02/26/2019  Decreased Interest 0 1 0  Down, Depressed, Hopeless 1 0 0  PHQ - 2 Score 1 1 0  Altered sleeping 0 0 0  Tired, decreased energy 2 1 0  Change in appetite 0 0 0  Feeling bad or failure about yourself  0 0 0  Trouble concentrating 3 0 3  Moving slowly or fidgety/restless 0 0 0  Suicidal thoughts 0 0 0  PHQ-9 Score 6 2 3   Difficult doing work/chores Not difficult at all Not difficult at all Not difficult at all      -----------------------------------------------------------------------------------------     Patient Active Problem List   Diagnosis Date Noted  . OA (osteoarthritis) 09/19/2019  . History of recurrent ear infection 09/19/2019  . Prediabetes 07/31/2019  . Chronic right shoulder pain 02/26/2019  . Senile purpura ([place]) 02/26/2019  . Breast pain, left 02/26/2019  . Renal mass 02/26/2019  . COPD (chronic obstructive pulmonary disease) ([place]) 01/06/2019  . AAA (abdominal aortic aneurysm) without rupture ([employer]) 10/10/2017  . Varicose veins of both lower extremities with pain 10/10/2017  . Claudication ([place]) 10/10/2017  . Hyperlipemia, mixed 08/14/2017  . Neuropathy 08/14/2017  . Depression, prolonged 08/14/2017  . Cervical disc disease 08/14/2017  . Diarrhea 08/14/2017  . Multiple pulmonary nodules 04/29/2016  . Iron deficiency anemia due to chronic blood loss 07/29/2015  . Tobacco dependence 07/01/2015  . PUD (peptic ulcer disease) 06/18/2014  . Bigeminy 09/03/2013  . Onychomycosis 02/25/2013  . Hallux valgus with bunions 01/14/2013  . Hammertoe 01/14/2013  . DDD (degenerative disc disease), lumbar 08/09/2012  . Coronary artery disease involving native coronary artery of native heart without angina pectoris 08/03/2012  . Hypothyroidism 07/02/2012  . GERD (gastroesophageal reflux disease) 07/02/2012  . Cervical spondylosis without myelopathy 07/21/2011  . Sciatica 07/21/2011  . Hypertension, essential 11/26/2010  . Paroxysmal atrial fibrillation ([place]) 11/26/2010   Social History   Tobacco Use  . Smoking status: Current Every Day Smoker    Packs/day: 1.00    Years: 62.00    Pack years: 62.00  Types: Cigarettes  . Smokeless tobacco: Never Used  Substance Use Topics  . Alcohol use: Not Currently  . Drug use: Never      Medications: Outpatient Medications Prior to Visit  Medication Sig  . acetaminophen (ACETAMINOPHEN 8 HOUR) 650 MG CR tablet Take 1,300 mg by mouth  every 8 (eight) hours.  Marland Kitchen albuterol (VENTOLIN HFA) 108 (90 Base) MCG/ACT inhaler Inhale 2 puffs into the lungs every 6 (six) hours as needed for wheezing or shortness of breath.  Marland Kitchen aspirin EC 81 MG tablet Take 81 mg by mouth daily.  . carvedilol (COREG) 25 MG tablet Take 25 mg by mouth 2 (two) times daily with a meal.  . cloNIDine (CATAPRES) 0.2 MG tablet Take 0.2 mg by mouth 2 (two) times daily.  . diphenhydrAMINE (BENADRYL) 25 MG tablet Take 50 mg by mouth every 8 (eight) hours as needed.  Marland Kitchen EPINEPHrine 0.3 mg/0.3 mL IJ SOAJ injection Inject 0.3 mLs (0.3 mg total) into the muscle as needed for anaphylaxis.  . ferrous sulfate 325 (65 FE) MG tablet Take 1 tablet (325 mg total) by mouth daily with breakfast.  . fluticasone (FLONASE) 50 MCG/ACT nasal spray Place into the nose.  . gabapentin (NEURONTIN) 600 MG tablet Take 600 mg by mouth 2 (two) times daily.  Marland Kitchen gemfibrozil (LOPID) 600 MG tablet Take 1 tablet (600 mg total) by mouth 2 (two) times daily before a meal.  . levothyroxine (SYNTHROID) 100 MCG tablet TAKE 1 TABLET BY MOUTH ONCE DAILY BEFORE BREAKFAST  . lisinopril (ZESTRIL) 20 MG tablet Take by mouth.  . Multiple Vitamins-Minerals (MULTIVITAMIN GUMMIES ADULT PO) Take 1 each by mouth daily.  . nitroGLYCERIN (NITROSTAT) 0.4 MG SL tablet Place 0.4 mg under the tongue every 5 (five) minutes as needed for chest pain.  Marland Kitchen omeprazole (PRILOSEC) 20 MG capsule Take 1 capsule (20 mg total) by mouth daily.  . pravastatin (PRAVACHOL) 40 MG tablet TAKE 1 TABLET BY MOUTH AT BEDTIME  . [DISCONTINUED] Venlafaxine HCl 225 MG TB24 Take 225 mg by mouth daily.   . clonazePAM (KLONOPIN) 0.5 MG tablet Take by mouth.   No facility-administered medications prior to visit.    Review of Systems  Constitutional: Negative.   Cardiovascular: Negative.   Psychiatric/Behavioral: Positive for confusion and decreased concentration.     Objective    There were no vitals taken for this visit. BP Readings from Last  3 Encounters:  07/31/19 136/68  04/22/19 134/69  02/26/19 (!) 155/69   Wt Readings from Last 3 Encounters:  07/31/19 165 lb (74.8 kg)  04/22/19 162 lb (73.5 kg)  02/26/19 164 lb 3.2 oz (74.5 kg)        Assessment & Plan     Problem List Items Addressed This Visit      Musculoskeletal and Integument   OA (osteoarthritis)    Reassured patient that it is ok to take Tylenol TID for leg pain      Relevant Medications   acetaminophen (ACETAMINOPHEN 8 HOUR) 650 MG CR tablet     Other   Depression, prolonged - Primary    Longstanding issue Worsening since being out of Effexor due to refill issues (pharmacy still sending to previous PCP) Previously well controlled on Effexor - has tried to taper and wean in the past with recurrence of symptoms Will resume Effexor XR 225 mg daily that she was previously well controlled on Contracted for safety - no SI/HI Repeat PHQ9 at next visit      Relevant Medications  Venlafaxine HCl 225 MG TB24   History of recurrent ear infection    Patient reports recent UC visit for middle ear effusion She was treated with abx for sinusitis Continues to have intermittent ear pain Has h/o recurrent AOM Wishes to see ENT - referral placed today      Relevant Orders   Ambulatory referral to ENT       Long discussion with patient about independent living - which she is doing currently and wishes to continue doing - vs ALF  Return for as scheduled.    I discussed the assessment and treatment plan with the patient. The patient was provided an opportunity to ask questions and all were answered. The patient agreed with the plan and demonstrated an understanding of the instructions.   The patient was advised to call back or seek an in-person evaluation if the symptoms worsen or if the condition fails to improve as anticipated.  I provided 30 minutes of non-face-to-face time during this encounter.  I, Shirlee Latch, MD, have reviewed all  documentation for this visit. The documentation on 09/19/19 for the exam, diagnosis, procedures, and orders are all accurate and complete.   Gena Laski, Marzella Schlein, MD, MPH [facility]

## 2019-09-19 NOTE — Assessment & Plan Note (Addendum)
Reassured patient that it is ok to take Tylenol TID for leg pain

## 2019-09-24 ENCOUNTER — Telehealth: Payer: Self-pay | Admitting: *Deleted

## 2019-09-24 NOTE — Telephone Encounter (Signed)
Referral received for pt to be seen in Lung Nodule Clinic for further workup of incidental lung nodule with follow up CT scan. Left message with patient to call back to discuss clinic and review recommendations and upcoming appts including follow up CT scan and visit with Jenny Burns, NP to discuss results. Awaiting call back.  

## 2019-10-01 ENCOUNTER — Other Ambulatory Visit: Payer: Self-pay | Admitting: Family Medicine

## 2019-10-01 NOTE — Telephone Encounter (Signed)
Copied from CRM [id number]. Topic: Quick Communication - Rx Refill/Question >> Oct 01, 2019  3:56 PM Mcneil, Jannifer Rodney wrote: Pt stated she needs all her prescriptions to be 90 day supply. Medication: cloNIDine (CATAPRES) 0.2 MG tablet, lisinopril (ZESTRIL) 20 MG tablet, and carvedilol (COREG) 25 MG tablet   Has the patient contacted their pharmacy? no  Preferred Pharmacy (with phone number or street name): [facility] [place], [place] - [address]  Phone: [phone]   Fax: [phone]  Agent: Please be advised that RX refills may take up to 3 business days. We ask that you follow-up with your pharmacy.

## 2019-10-01 NOTE — Telephone Encounter (Signed)
Requested medication (s) are due for refill today -unknown  Requested medication (s) are on the active medication list -yes  Future visit scheduled -yes  Last refill:  2 months-4 years  Notes to clinic: Patient is requesting 90 day supply on medications listed as historical   Requested Prescriptions  Pending Prescriptions Disp Refills   carvedilol (COREG) 25 MG tablet      Sig: Take 1 tablet (25 mg total) by mouth 2 (two) times daily with a meal.      Cardiovascular:  Beta Blockers Passed - 10/01/2019  4:08 PM      Passed - Last BP in normal range    BP Readings from Last 1 Encounters:  07/31/19 136/68          Passed - Last Heart Rate in normal range    Pulse Readings from Last 1 Encounters:  07/31/19 62          Passed - Valid encounter within last 6 months    Recent Outpatient Visits           1 week ago Depression, prolonged   [facility] [place], Dionne Bucy, MD   2 months ago Encounter for annual wellness visit (AWV) in Medicare patient   [place], Dionne Bucy, MD   5 months ago Chronic right shoulder pain   [facility] [place], Dionne Bucy, MD   7 months ago Essential hypertension   [place], Dionne Bucy, MD       Future Appointments             In 4 months Bacigalupo, Dionne Bucy, MD [facility]              lisinopril (ZESTRIL) 20 MG tablet       Sig: Take by mouth.      Cardiovascular:  ACE Inhibitors Passed - 10/01/2019  4:08 PM      Passed - Cr in normal range and within 180 days    Creatinine, Ser  Date Value Ref Range Status  08/20/2019 1.00 0.57 - 1.00 mg/dL Final          Passed - K in normal range and within 180 days    Potassium  Date Value Ref Range Status  08/20/2019 4.4 3.5 - 5.2 mmol/L Final          Passed - Patient is not pregnant      Passed - Last BP in normal range    BP Readings from Last 1 Encounters:  07/31/19 136/68           Passed - Valid encounter within last 6 months    Recent Outpatient Visits           1 week ago Depression, prolonged   [place], Dionne Bucy, MD   2 months ago Encounter for annual wellness visit (AWV) in Medicare patient   [place], MD   5 months ago Chronic right shoulder pain   [facility] [place], Dionne Bucy, MD   7 months ago Essential hypertension   [place], Dionne Bucy, MD       Future Appointments             In 4 months Bacigalupo, Dionne Bucy, MD [facility]              cloNIDine (CATAPRES) 0.2 MG tablet  Sig: Take 1 tablet (0.2 mg total) by mouth 2 (two) times daily.      Cardiovascular:  Alpha-2 Agonists Passed - 10/01/2019  4:08 PM      Passed - Last BP in normal range    BP Readings from Last 1 Encounters:  07/31/19 136/68          Passed - Last Heart Rate in normal range    Pulse Readings from Last 1 Encounters:  07/31/19 62          Passed - Valid encounter within last 6 months    Recent Outpatient Visits           1 week ago Depression, prolonged   [facility] [place], Marzella Schlein, MD   2 months ago Encounter for annual wellness visit (AWV) in Medicare patient   [facility] [place], Marzella Schlein, MD   5 months ago Chronic right shoulder pain   [facility] [place], Marzella Schlein, MD   7 months ago Essential hypertension   [facility] [place], Marzella Schlein, MD       Future Appointments             In 4 months Bacigalupo, Marzella Schlein, MD [facility]                Requested Prescriptions  Pending Prescriptions Disp Refills   carvedilol (COREG) 25 MG tablet      Sig: Take 1 tablet (25 mg total) by mouth 2 (two) times daily with a meal.      Cardiovascular:  Beta Blockers Passed - 10/01/2019  4:08 PM      Passed - Last  BP in normal range    BP Readings from Last 1 Encounters:  07/31/19 136/68          Passed - Last Heart Rate in normal range    Pulse Readings from Last 1 Encounters:  07/31/19 62          Passed - Valid encounter within last 6 months    Recent Outpatient Visits           1 week ago Depression, prolonged   [facility] [place], Marzella Schlein, MD   2 months ago Encounter for annual wellness visit (AWV) in Medicare patient   [facility] [place], Marzella Schlein, MD   5 months ago Chronic right shoulder pain   [facility] [place], Marzella Schlein, MD   7 months ago Essential hypertension   [facility] [place]igal[place], Marzella Schlein, MD       Future Appointments             In 4 months Bacigalupo, Marzella Schlein, MD [facility], [facility]              lisinopril (ZESTRIL) 20 MG tablet       Sig: Take by mouth.      Cardiovascular:  ACE Inhibitors Passed - 10/01/2019  4:08 PM      Passed - Cr in normal range and within 180 days    Creatinine, Ser  Date Value Ref Range Status  08/20/2019 1.00 0.57 - 1.00 mg/dL Final          Passed - K in normal range and within 180 days    Potassium  Date Value Ref Range Status  08/20/2019 4.4 3.5 - 5.2 mmol/L Final          Passed - Patient is not pregnant  Passed - Last BP in normal range    BP Readings from Last 1 Encounters:  07/31/19 136/68          Passed - Valid encounter within last 6 months    Recent Outpatient Visits           1 week ago Depression, prolonged   [facility] [place], Marzella Schlein, MD   2 months ago Encounter for annual wellness visit (AWV) in Medicare patient   [facility] [place], Marzella Schlein, MD   5 months ago Chronic right shoulder pain   [facility] [place], Marzella Schlein, MD   7 months ago Essential hypertension   [facility] [place], Marzella Schlein, MD       Future Appointments              In 4 months Bacigalupo, Marzella Schlein, MD [facility]              cloNIDine (CATAPRES) 0.2 MG tablet       Sig: Take 1 tablet (0.2 mg total) by mouth 2 (two) times daily.      Cardiovascular:  Alpha-2 Agonists Passed - 10/01/2019  4:08 PM      Passed - Last BP in normal range    BP Readings from Last 1 Encounters:  07/31/19 136/68          Passed - Last Heart Rate in normal range    Pulse Readings from Last 1 Encounters:  07/31/19 62          Passed - Valid encounter within last 6 months    Recent Outpatient Visits           1 week ago Depression, prolonged   [facility] [place], Marzella Schlein, MD   2 months ago Encounter for annual wellness visit (AWV) in Medicare patient   [facility] [place], Marzella Schlein, MD   5 months ago Chronic right shoulder pain   [facility] [place], Marzella Schlein, MD   7 months ago Essential hypertension   [facility] Bacigalupo, Marzella Schlein, MD       Future Appointments             In 4 months Bacigalupo, Marzella Schlein, MD [facility]

## 2019-10-02 MED ORDER — CARVEDILOL 25 MG PO TABS: 25.0000 mg | ORAL_TABLET | Freq: Two times a day (BID) | ORAL | 1 refills | Status: DC

## 2019-10-02 MED ORDER — LISINOPRIL 20 MG PO TABS: 20.0000 mg | ORAL_TABLET | Freq: Every day | ORAL | 1 refills | Status: DC

## 2019-10-02 MED ORDER — CLONIDINE HCL 0.2 MG PO TABS: 0.2000 mg | ORAL_TABLET | Freq: Two times a day (BID) | ORAL | 1 refills | Status: DC

## 2019-11-11 ENCOUNTER — Other Ambulatory Visit: Payer: Self-pay | Admitting: Family Medicine

## 2019-12-16 ENCOUNTER — Other Ambulatory Visit: Payer: Self-pay | Admitting: Family Medicine

## 2019-12-18 ENCOUNTER — Telehealth: Payer: Self-pay

## 2019-12-18 NOTE — Telephone Encounter (Signed)
I didn't see message or pending referral as to why we would contact this patient multiple times. Since she is a patient of Dr. Suzan Slick did Vernona Rieger by chance try to reach out to patient? She was adamant that callers name was Diannia Ruder, she said it was not in regards to prescription sent in 12/16/19. Renette Butters Renette Butters

## 2019-12-18 NOTE — Telephone Encounter (Signed)
Copied from CRM [id number]. Topic: General - Other >> Dec 18, 2019 11:38 AM Wyonia Hough E wrote: Reason for CRM: Pt called and stated someone by the [facility] has called her several times / Pt stated the person didn't state what they were calling for but just gave her the [facility] number to call back but [facility] doesn't have an employee named [place] either / please advise

## 2019-12-18 NOTE — Telephone Encounter (Signed)
I did not call her.  I'm sorry.

## 2020-01-11 ENCOUNTER — Other Ambulatory Visit: Payer: Self-pay | Admitting: Family Medicine

## 2020-01-11 NOTE — Telephone Encounter (Signed)
Requested Prescriptions  Pending Prescriptions Disp Refills  . gemfibrozil (LOPID) 600 MG tablet [Pharmacy Med Name: Gemfibrozil 600 MG Oral Tablet] 180 tablet 3    Sig: TAKE 1 TABLET BY MOUTH TWICE DAILY BEFORE A MEAL     Cardiovascular:  Antilipid - Fibric Acid Derivatives Failed - 01/11/2020  4:54 PM      Failed - LDL in normal range and within 360 days    LDL Chol Calc (NIH)  Date Value Ref Range Status  08/20/2019 84 0 - 99 mg/dL Final         Passed - Total Cholesterol in normal range and within 360 days    Cholesterol, Total  Date Value Ref Range Status  08/20/2019 150 100 - 199 mg/dL Final         Passed - HDL in normal range and within 360 days    HDL  Date Value Ref Range Status  08/20/2019 53 >39 mg/dL Final         Passed - Triglycerides in normal range and within 360 days    Triglycerides  Date Value Ref Range Status  08/20/2019 62 0 - 149 mg/dL Final         Passed - ALT in normal range and within 180 days    ALT  Date Value Ref Range Status  08/20/2019 9 0 - 32 IU/L Final         Passed - AST in normal range and within 180 days    AST  Date Value Ref Range Status  08/20/2019 19 0 - 40 IU/L Final         Passed - Cr in normal range and within 180 days    Creatinine, Ser  Date Value Ref Range Status  08/20/2019 1.00 0.57 - 1.00 mg/dL Final         Passed - eGFR in normal range and within 180 days    GFR calc Af Amer  Date Value Ref Range Status  08/20/2019 61 >59 mL/min/1.73 Final   GFR calc non Af Amer  Date Value Ref Range Status  08/20/2019 53 (L) >59 mL/min/1.73 Final         Passed - Valid encounter within last 12 months    Recent Outpatient Visits          3 months ago Depression, prolonged   [facility] [place], Dionne Bucy, MD   5 months ago Encounter for annual wellness visit (AWV) in Medicare patient   [place], Dionne Bucy, MD   8 months ago Chronic right shoulder pain   [facility] [place], Dionne Bucy, MD   10 months ago Essential hypertension   [place], Dionne Bucy, MD      Future Appointments            In 2 weeks Bacigalupo, Dionne Bucy, MD [facility], [facility]           . pravastatin (PRAVACHOL) 40 MG tablet [Pharmacy Med Name: Pravastatin Sodium 40 MG Oral Tablet] 90 tablet     Sig: TAKE 1 TABLET BY MOUTH AT BEDTIME     Cardiovascular:  Antilipid - Statins Failed - 01/11/2020  4:54 PM      Failed - LDL in normal range and within 360 days    LDL Chol Calc (NIH)  Date Value Ref Range Status  08/20/2019 84 0 - 99 mg/dL Final         Passed - Total Cholesterol  in normal range and within 360 days    Cholesterol, Total  Date Value Ref Range Status  08/20/2019 150 100 - 199 mg/dL Final         Passed - HDL in normal range and within 360 days    HDL  Date Value Ref Range Status  08/20/2019 53 >39 mg/dL Final         Passed - Triglycerides in normal range and within 360 days    Triglycerides  Date Value Ref Range Status  08/20/2019 62 0 - 149 mg/dL Final         Passed - Patient is not pregnant      Passed - Valid encounter within last 12 months    Recent Outpatient Visits          3 months ago Depression, prolonged   [facility] [place], Dionne Bucy, MD   5 months ago Encounter for annual wellness visit (AWV) in Medicare patient   [place], MD   8 months ago Chronic right shoulder pain   [facility] [place], Dionne Bucy, MD   10 months ago Essential hypertension   [place], Dionne Bucy, MD      Future Appointments            In 2 weeks Bacigalupo, Dionne Bucy, MD [facility], [place]           . Venlafaxine HCl 225 MG TB24 [Pharmacy Med Name: VENLAFAXINE ER 225MG TAB] 90 tablet     Sig: Take 1 tablet by mouth once daily     Psychiatry: Antidepressants - SNRI - desvenlafaxine &  venlafaxine Failed - 01/11/2020  4:54 PM      Failed - LDL in normal range and within 360 days    LDL Chol Calc (NIH)  Date Value Ref Range Status  08/20/2019 84 0 - 99 mg/dL Final         Passed - Total Cholesterol in normal range and within 360 days    Cholesterol, Total  Date Value Ref Range Status  08/20/2019 150 100 - 199 mg/dL Final         Passed - Triglycerides in normal range and within 360 days    Triglycerides  Date Value Ref Range Status  08/20/2019 62 0 - 149 mg/dL Final         Passed - Completed PHQ-2 or PHQ-9 in the last 360 days.      Passed - Last BP in normal range    BP Readings from Last 1 Encounters:  07/31/19 136/68         Passed - Valid encounter within last 6 months    Recent Outpatient Visits          3 months ago Depression, prolonged   [place], Dionne Bucy, MD   5 months ago Encounter for annual wellness visit (AWV) in Medicare patient   [place], MD   8 months ago Chronic right shoulder pain   [facility] [place], Dionne Bucy, MD   10 months ago Essential hypertension   [place], Dionne Bucy, MD      Future Appointments            In 2 weeks Bacigalupo, Dionne Bucy, MD [facility]           . carvedilol (COREG) 25 MG tablet [Pharmacy Med Name: Carvedilol 25 MG  Oral Tablet] 180 tablet     Sig: TAKE 1 TABLET BY MOUTH TWICE DAILY WITH MEALS     Cardiovascular:  Beta Blockers Passed - 01/11/2020  4:54 PM      Passed - Last BP in normal range    BP Readings from Last 1 Encounters:  07/31/19 136/68         Passed - Last Heart Rate in normal range    Pulse Readings from Last 1 Encounters:  07/31/19 62         Passed - Valid encounter within last 6 months    Recent Outpatient Visits          3 months ago Depression, prolonged   [facility] [place], Dionne Bucy, MD   5 months ago Encounter for  annual wellness visit (AWV) in Medicare patient   [place], MD   8 months ago Chronic right shoulder pain   [facility] [place], Dionne Bucy, MD   10 months ago Essential hypertension   [place], Dionne Bucy, MD      Future Appointments            In 2 weeks Bacigalupo, Dionne Bucy, MD [facility]           . cloNIDine (CATAPRES) 0.2 MG tablet [Pharmacy Med Name: cloNIDine HCl 0.2 MG Oral Tablet] 180 tablet     Sig: Take 1 tablet by mouth twice daily     Cardiovascular:  Alpha-2 Agonists Passed - 01/11/2020  4:54 PM      Passed - Last BP in normal range    BP Readings from Last 1 Encounters:  07/31/19 136/68         Passed - Last Heart Rate in normal range    Pulse Readings from Last 1 Encounters:  07/31/19 62         Passed - Valid encounter within last 6 months    Recent Outpatient Visits          3 months ago Depression, prolonged   [place], Dionne Bucy, MD   5 months ago Encounter for annual wellness visit (AWV) in Medicare patient   [place], MD   8 months ago Chronic right shoulder pain   [facility] [place], Dionne Bucy, MD   10 months ago Essential hypertension   [place], Dionne Bucy, MD      Future Appointments            In 2 weeks Bacigalupo, Dionne Bucy, MD [facility]           . lisinopril (ZESTRIL) 20 MG tablet [Pharmacy Med Name: Lisinopril 20 MG Oral Tablet] 90 tablet     Sig: Take 1 tablet by mouth once daily     Cardiovascular:  ACE Inhibitors Passed - 01/11/2020  4:54 PM      Passed - Cr in normal range and within 180 days    Creatinine, Ser  Date Value Ref Range Status  08/20/2019 1.00 0.57 - 1.00 mg/dL Final         Passed - K in normal range and within 180 days    Potassium  Date Value Ref Range Status   08/20/2019 4.4 3.5 - 5.2 mmol/L Final         Passed - Patient is not pregnant      Passed - Last BP in normal  range    BP Readings from Last 1 Encounters:  07/31/19 136/68         Passed - Valid encounter within last 6 months    Recent Outpatient Visits          3 months ago Depression, prolonged   [facility] [place], Dionne Bucy, MD   5 months ago Encounter for annual wellness visit (AWV) in Medicare patient   [facility] [place], Dionne Bucy, MD   8 months ago Chronic right shoulder pain   [facility] [place], Dionne Bucy, MD   10 months ago Essential hypertension   [place], Dionne Bucy, MD      Future Appointments            In 2 weeks Bacigalupo, Dionne Bucy, MD [facility] [place] 100 MCG tablet [Pharmacy Med Name: Euthyrox 100 MCG Oral Tablet] 90 tablet     Sig: TAKE 1 TABLET BY MOUTH ONCE [place]     Endocrinology:  Hypothyroid Agents Failed - 01/11/2020  4:54 PM      Failed - TSH needs to be rechecked within 3 months after an abnormal result. Refill until TSH is due.      Passed - TSH in normal range and within 360 days    TSH  Date Value Ref Range Status  08/20/2019 1.740 0.450 - 4.500 uIU/mL Final         Passed - Valid encounter within last 12 months    Recent Outpatient Visits          3 months ago Depression, prolonged   [place], Dionne Bucy, MD   5 months ago Encounter for annual wellness visit (AWV) in Medicare patient   [place], MD   8 months ago Chronic right shoulder pain   [facility] [place], Dionne Bucy, MD   10 months ago Essential hypertension   [place], Dionne Bucy, MD      Future Appointments            In 2 weeks Bacigalupo, Dionne Bucy, MD [facility] [place]

## 2020-01-11 NOTE — Telephone Encounter (Signed)
Requested medication (s) are due for refill today: ye  Requested medication (s) are on the active medication list: pharmacy requesting Euthyrox  Last refill:  levothyroxine 07/09/19  Future visit scheduled: yes  Notes to clinic:  sending back due to pharmacy request of Euthyrox- please review   Requested Prescriptions  Pending Prescriptions Disp Refills   EUTHYROX 100 MCG tablet [Pharmacy Med Name: Euthyrox 100 MCG Oral Tablet] 90 tablet     Sig: TAKE 1 TABLET BY [place]      Endocrinology:  Hypothyroid Agents Failed - 01/11/2020  4:54 PM      Failed - TSH needs to be rechecked within 3 months after an abnormal result. Refill until TSH is due.      Passed - TSH in normal range and within 360 days    TSH  Date Value Ref Range Status  08/20/2019 1.740 0.450 - 4.500 uIU/mL Final          Passed - Valid encounter within last 12 months    Recent Outpatient Visits           3 months ago Depression, prolonged   [place], Dionne Bucy, MD   5 months ago Encounter for annual wellness visit (AWV) in Medicare patient   [place], MD   8 months ago Chronic right shoulder pain   [facility] [place], Dionne Bucy, MD   10 months ago Essential hypertension   [place], Dionne Bucy, MD       Future Appointments             In 2 weeks [place], Dionne Bucy, MD [facility]             Signed Prescriptions Disp Refills   gemfibrozil (LOPID) 600 MG tablet 180 tablet 3    Sig: TAKE 1 TABLET BY MOUTH TWICE DAILY BEFORE A MEAL      Cardiovascular:  Antilipid - Fibric Acid Derivatives Failed - 01/11/2020  4:54 PM      Failed - LDL in normal range and within 360 days    LDL Chol Calc (NIH)  Date Value Ref Range Status  08/20/2019 84 0 - 99 mg/dL Final          Passed - Total Cholesterol in normal range and within 360 days     Cholesterol, Total  Date Value Ref Range Status  08/20/2019 150 100 - 199 mg/dL Final          Passed - HDL in normal range and within 360 days    HDL  Date Value Ref Range Status  08/20/2019 53 >39 mg/dL Final          Passed - Triglycerides in normal range and within 360 days    Triglycerides  Date Value Ref Range Status  08/20/2019 62 0 - 149 mg/dL Final          Passed - ALT in normal range and within 180 days    ALT  Date Value Ref Range Status  08/20/2019 9 0 - 32 IU/L Final          Passed - AST in normal range and within 180 days    AST  Date Value Ref Range Status  08/20/2019 19 0 - 40 IU/L Final          Passed - Cr in normal range and within 180 days    Creatinine, Ser  Date  Value Ref Range Status  08/20/2019 1.00 0.57 - 1.00 mg/dL Final          Passed - eGFR in normal range and within 180 days    GFR calc Af Amer  Date Value Ref Range Status  08/20/2019 61 >59 mL/min/1.73 Final   GFR calc non Af Amer  Date Value Ref Range Status  08/20/2019 53 (L) >59 mL/min/1.73 Final          Passed - Valid encounter within last 12 months    Recent Outpatient Visits           3 months ago Depression, prolonged   [facility] [place], Dionne Bucy, MD   5 months ago Encounter for annual wellness visit (AWV) in Medicare patient   [facility] [place], Dionne Bucy, MD   8 months ago Chronic right shoulder pain   [facility] [place], Dionne Bucy, MD   10 months ago Essential hypertension   [place], Dionne Bucy, MD       Future Appointments             In 2 weeks Bacigalupo, Dionne Bucy, MD [facility]             Refused Prescriptions Disp Refills   pravastatin (PRAVACHOL) 40 MG tablet [Pharmacy Med Name: Pravastatin Sodium 40 MG Oral Tablet] 90 tablet     Sig: TAKE 1 TABLET BY MOUTH AT BEDTIME      Cardiovascular:  Antilipid - Statins Failed - 01/11/2020   4:54 PM      Failed - LDL in normal range and within 360 days    LDL Chol Calc (NIH)  Date Value Ref Range Status  08/20/2019 84 0 - 99 mg/dL Final          Passed - Total Cholesterol in normal range and within 360 days    Cholesterol, Total  Date Value Ref Range Status  08/20/2019 150 100 - 199 mg/dL Final          Passed - HDL in normal range and within 360 days    HDL  Date Value Ref Range Status  08/20/2019 53 >39 mg/dL Final          Passed - Triglycerides in normal range and within 360 days    Triglycerides  Date Value Ref Range Status  08/20/2019 62 0 - 149 mg/dL Final          Passed - Patient is not pregnant      Passed - Valid encounter within last 12 months    Recent Outpatient Visits           3 months ago Depression, prolonged   [place], Dionne Bucy, MD   5 months ago Encounter for annual wellness visit (AWV) in Medicare patient   [place], Dionne Bucy, MD   8 months ago Chronic right shoulder pain   [facility] [place], Dionne Bucy, MD   10 months ago Essential hypertension   [place], Dionne Bucy, MD       Future Appointments             In 2 weeks Bacigalupo, Dionne Bucy, MD [facility]              Venlafaxine HCl 225 MG TB24 [Pharmacy Med Name: VENLAFAXINE ER 225MG TAB] 90 tablet     Sig: Take 1 tablet by mouth once daily  Psychiatry: Antidepressants - SNRI - desvenlafaxine & venlafaxine Failed - 01/11/2020  4:54 PM      Failed - LDL in normal range and within 360 days    LDL Chol Calc (NIH)  Date Value Ref Range Status  08/20/2019 84 0 - 99 mg/dL Final          Passed - Total Cholesterol in normal range and within 360 days    Cholesterol, Total  Date Value Ref Range Status  08/20/2019 150 100 - 199 mg/dL Final          Passed - Triglycerides in normal range and within 360 days    Triglycerides  Date Value Ref  Range Status  08/20/2019 62 0 - 149 mg/dL Final          Passed - Completed PHQ-2 or PHQ-9 in the last 360 days.      Passed - Last BP in normal range    BP Readings from Last 1 Encounters:  07/31/19 136/68          Passed - Valid encounter within last 6 months    Recent Outpatient Visits           3 months ago Depression, prolonged   [place], Dionne Bucy, MD   5 months ago Encounter for annual wellness visit (AWV) in Medicare patient   [place], MD   8 months ago Chronic right shoulder pain   [facility] [place], Dionne Bucy, MD   10 months ago Essential hypertension   [facility], Dionne Bucy, MD       Future Appointments             In 2 weeks Bacigalupo, Dionne Bucy, MD [facility]              carvedilol (COREG) 25 MG tablet [Pharmacy Med Name: Carvedilol 25 MG Oral Tablet] 180 tablet     Sig: TAKE 1 TABLET BY MOUTH TWICE DAILY WITH MEALS      Cardiovascular:  Beta Blockers Passed - 01/11/2020  4:54 PM      Passed - Last BP in normal range    BP Readings from Last 1 Encounters:  07/31/19 136/68          Passed - Last Heart Rate in normal range    Pulse Readings from Last 1 Encounters:  07/31/19 62          Passed - Valid encounter within last 6 months    Recent Outpatient Visits           3 months ago Depression, prolonged   [place], Dionne Bucy, MD   5 months ago Encounter for annual wellness visit (AWV) in Medicare patient   [facility] [place], Dionne Bucy, MD   8 months ago Chronic right shoulder pain   [facility], Dionne Bucy, MD   10 months ago Essential hypertension   [place], Dionne Bucy, MD       Future Appointments             In 2 weeks Bacigalupo, Dionne Bucy, MD [facility]               cloNIDine (CATAPRES) 0.2 MG tablet [Pharmacy Med Name: cloNIDine HCl 0.2 MG Oral Tablet] 180 tablet     Sig: Take 1 tablet by mouth twice daily      Cardiovascular:  Alpha-2 Agonists Passed - 01/11/2020  4:54 PM      Passed - Last BP in normal range    BP Readings from Last 1 Encounters:  07/31/19 136/68          Passed - Last Heart Rate in normal range    Pulse Readings from Last 1 Encounters:  07/31/19 62          Passed - Valid encounter within last 6 months    Recent Outpatient Visits           3 months ago Depression, prolonged   [facility] [place], Dionne Bucy, MD   5 months ago Encounter for annual wellness visit (AWV) in Medicare patient   [place], MD   8 months ago Chronic right shoulder pain   [facility] [place], Dionne Bucy, MD   10 months ago Essential hypertension   [place], Dionne Bucy, MD       Future Appointments             In 2 weeks Bacigalupo, Dionne Bucy, MD [facility]              lisinopril (ZESTRIL) 20 MG tablet [Pharmacy Med Name: Lisinopril 20 MG Oral Tablet] 90 tablet     Sig: Take 1 tablet by mouth once daily      Cardiovascular:  ACE Inhibitors Passed - 01/11/2020  4:54 PM      Passed - Cr in normal range and within 180 days    Creatinine, Ser  Date Value Ref Range Status  08/20/2019 1.00 0.57 - 1.00 mg/dL Final          Passed - K in normal range and within 180 days    Potassium  Date Value Ref Range Status  08/20/2019 4.4 3.5 - 5.2 mmol/L Final          Passed - Patient is not pregnant      Passed - Last BP in normal range    BP Readings from Last 1 Encounters:  07/31/19 136/68          Passed - Valid encounter within last 6 months    Recent Outpatient Visits           3 months ago Depression, prolonged   [employer], Dionne Bucy, MD   5 months ago Encounter for annual  wellness visit (AWV) in Medicare patient   [place], MD   8 months ago Chronic right shoulder pain   [facility] [place], Dionne Bucy, MD   10 months ago Essential hypertension   [place], Dionne Bucy, MD       Future Appointments             In 2 weeks Bacigalupo, Dionne Bucy, MD [facility] [place]

## 2020-01-14 ENCOUNTER — Telehealth: Payer: Self-pay | Admitting: *Deleted

## 2020-01-14 NOTE — Telephone Encounter (Signed)
Unable to contact pt by phone to review referral and upcoming appts in the lung nodule clinic. Appts mailed and contact info included for patient if needed to call back to further discuss.

## 2020-01-28 ENCOUNTER — Telehealth: Payer: Self-pay | Admitting: *Deleted

## 2020-01-28 NOTE — Telephone Encounter (Signed)
Attempted to call pt on phone numbers listed in chart but pt did not answer and there was no voicemail set up to leave a message. Appts mailed to inform pt of updated appts.

## 2020-01-30 ENCOUNTER — Other Ambulatory Visit: Payer: Self-pay

## 2020-01-30 ENCOUNTER — Encounter: Payer: Self-pay | Admitting: Family Medicine

## 2020-01-30 ENCOUNTER — Ambulatory Visit (INDEPENDENT_AMBULATORY_CARE_PROVIDER_SITE_OTHER): Payer: Medicare HMO | Admitting: Family Medicine

## 2020-01-30 VITALS — BP 155/79 | HR 60 | Temp 98.9°F | Wt 159.0 lb

## 2020-01-30 DIAGNOSIS — E039 Hypothyroidism, unspecified: Secondary | ICD-10-CM

## 2020-01-30 DIAGNOSIS — I25118 Atherosclerotic heart disease of native coronary artery with other forms of angina pectoris: Secondary | ICD-10-CM

## 2020-01-30 DIAGNOSIS — J439 Emphysema, unspecified: Secondary | ICD-10-CM

## 2020-01-30 DIAGNOSIS — D509 Iron deficiency anemia, unspecified: Secondary | ICD-10-CM

## 2020-01-30 DIAGNOSIS — D692 Other nonthrombocytopenic purpura: Secondary | ICD-10-CM

## 2020-01-30 DIAGNOSIS — I480 Paroxysmal atrial fibrillation: Secondary | ICD-10-CM

## 2020-01-30 DIAGNOSIS — R7303 Prediabetes: Secondary | ICD-10-CM

## 2020-01-30 DIAGNOSIS — Z23 Encounter for immunization: Secondary | ICD-10-CM

## 2020-01-30 DIAGNOSIS — G629 Polyneuropathy, unspecified: Secondary | ICD-10-CM

## 2020-01-30 DIAGNOSIS — E782 Mixed hyperlipidemia: Secondary | ICD-10-CM

## 2020-01-30 DIAGNOSIS — K279 Peptic ulcer, site unspecified, unspecified as acute or chronic, without hemorrhage or perforation: Secondary | ICD-10-CM

## 2020-01-30 DIAGNOSIS — I10 Essential (primary) hypertension: Secondary | ICD-10-CM

## 2020-01-30 MED ORDER — GABAPENTIN 600 MG PO TABS: 600.0000 mg | ORAL_TABLET | Freq: Two times a day (BID) | ORAL | 1 refills | Status: DC

## 2020-01-30 MED ORDER — LISINOPRIL 40 MG PO TABS: 40.0000 mg | ORAL_TABLET | Freq: Every day | ORAL | 0 refills | Status: DC

## 2020-01-30 NOTE — Progress Notes (Signed)
I,Roshena L Chambers,acting as a scribe for Eileen LatchAngela Vanya Carberry, MD.,have documented all relevant documentation on the behalf of Eileen LatchAngela Calianna Kim, MD,as directed by  Eileen LatchAngela Colinda Barth, MD while in the presence of Eileen LatchAngela Brenyn Petrey, MD.  Established patient visit   Patient: Eileen Mack   DOB: 03-08-1936   84 y.o. Female  MRN: [id number] Visit Date: 01/30/2020  Today's healthcare provider: Shirlee LatchAngela Lamerle Jabs, MD   Chief Complaint  Patient presents with  . Depression  . Hypothyroidism   Subjective    HPI  Depression, Follow-up  She  was last seen for this 09/19/2019.       Changes made at last visit include resuming     Effexor XR 225 mg daily that she was          previously well controlled on. Will repeat PHQ9 at next visit.   She reports good compliance with treatment. She is not having side effects.   She reports good tolerance of treatment. Current symptoms include: depressed mood She feels she is Improved since last visit.  [facility] 01/30/2020 09/19/2019 07/31/2019  Decreased Interest 1 0 1  Down, Depressed, Hopeless 0 1 0  PHQ - 2 Score 1 1 1   Altered sleeping 0 0 0  Tired, decreased energy 3 2 1   Change in appetite 3 0 0  Feeling bad or failure about yourself  1 0 0  Trouble concentrating 2 3 0  Moving slowly or fidgety/restless 0 0 0  Suicidal thoughts 0 0 0  PHQ-9 Score 10 6 2   Difficult doing work/chores Not difficult at all Not difficult at all Not difficult at all    -----------------------------------------------------------------------------------------  Hypothyroid, follow-up  Lab Results  Component Value Date   TSH 1.740 08/20/2019   TSH 0.397 01/06/2019   Wt Readings from Last 3 Encounters:  01/30/20 159 lb (72.1 kg)  07/31/19 165 lb (74.8 kg)  04/22/19 162 lb (73.5 kg)    She was last seen for hypothyroid 6 months ago.  Management since that visit includes continuing same medication. She reports good compliance  with treatment. She is not having side effects.   Symptoms: No change in energy level No constipation  No diarrhea No heat / cold intolerance  No nervousness No palpitations  No weight changes    -----------------------------------------------------------------------------------------  Hypertension, follow-up  BP Readings from Last 3 Encounters:  01/30/20 (!) 155/79  07/31/19 136/68  04/22/19 134/69   Wt Readings from Last 3 Encounters:  01/30/20 159 lb (72.1 kg)  07/31/19 165 lb (74.8 kg)  04/22/19 162 lb (73.5 kg)     She was last seen for hypertension 6 months ago.  BP at that visit was 136/68. Management since that visit includes continue same medications.  She reports good compliance with treatment. She is not having side effects.  She is following a Regular diet. She is exercising. She does smoke.  Use of agents associated with hypertension: NSAIDS.   Outside blood pressures are not checked. Symptoms: No chest pain No chest pressure  No palpitations No syncope  No dyspnea No orthopnea  No paroxysmal nocturnal dyspnea No lower extremity edema   Pertinent labs: Lab Results  Component Value Date   CHOL 150 08/20/2019   HDL 53 08/20/2019   LDLCALC 84 08/20/2019   TRIG 62 08/20/2019   CHOLHDL 2.8 08/20/2019   Lab Results  Component Value Date   NA 142 08/20/2019   K 4.4 08/20/2019   CREATININE 1.00 08/20/2019  GFRNONAA 53 (L) 08/20/2019   GFRAA 61 08/20/2019   GLUCOSE 110 (H) 08/20/2019     The ASCVD Risk score Denman George DC Jr., et al., 2013) failed to calculate for the following reasons:   The 2013 ASCVD risk score is only valid for ages 69 to 39   ---------------------------------------------------------------------------------------------------    Medications: Outpatient Medications Prior to Visit  Medication Sig  . acetaminophen (ACETAMINOPHEN 8 HOUR) 650 MG CR tablet Take 1,300 mg by mouth every 8 (eight) hours.  Marland Kitchen albuterol (VENTOLIN HFA) 108  (90 Base) MCG/ACT inhaler Inhale 2 puffs into the lungs every 6 (six) hours as needed for wheezing or shortness of breath.  Marland Kitchen aspirin EC 81 MG tablet Take 81 mg by mouth daily.  . carvedilol (COREG) 25 MG tablet Take 1 tablet (25 mg total) by mouth 2 (two) times daily with a meal.  . cloNIDine (CATAPRES) 0.2 MG tablet Take 1 tablet (0.2 mg total) by mouth 2 (two) times daily.  . EUTHYROX 100 MCG tablet TAKE 1 TABLET BY MOUTH ONCE DAILY BEFORE BREAKFAST  . ferrous sulfate 325 (65 FE) MG tablet Take 1 tablet (325 mg total) by mouth daily with breakfast.  . fluticasone (FLONASE) 50 MCG/ACT nasal spray Place into the nose.  Marland Kitchen gemfibrozil (LOPID) 600 MG tablet TAKE 1 TABLET BY MOUTH TWICE DAILY BEFORE A MEAL  . Multiple Vitamins-Minerals (MULTIVITAMIN GUMMIES ADULT PO) Take 1 each by mouth daily.  . nitroGLYCERIN (NITROSTAT) 0.4 MG SL tablet Place 0.4 mg under the tongue every 5 (five) minutes as needed for chest pain.  Marland Kitchen omeprazole (PRILOSEC) 20 MG capsule Take 1 capsule by mouth once daily  . pravastatin (PRAVACHOL) 40 MG tablet TAKE 1 TABLET BY MOUTH AT BEDTIME  . Venlafaxine HCl 225 MG TB24 Take 1 tablet (225 mg total) by mouth daily.  . [DISCONTINUED] lisinopril (ZESTRIL) 20 MG tablet Take 1 tablet (20 mg total) by mouth daily.  . [DISCONTINUED] clonazePAM (KLONOPIN) 0.5 MG tablet Take by mouth. (Patient not taking: Reported on 01/30/2020)  . [DISCONTINUED] diphenhydrAMINE (BENADRYL) 25 MG tablet Take 50 mg by mouth every 8 (eight) hours as needed. (Patient not taking: Reported on 01/30/2020)  . [DISCONTINUED] EPINEPHrine 0.3 mg/0.3 mL IJ SOAJ injection Inject 0.3 mLs (0.3 mg total) into the muscle as needed for anaphylaxis. (Patient not taking: Reported on 01/30/2020)  . [DISCONTINUED] gabapentin (NEURONTIN) 600 MG tablet Take 600 mg by mouth 2 (two) times daily. (Patient not taking: Reported on 01/30/2020)   No facility-administered medications prior to visit.    Review of Systems    Constitutional: Negative for appetite change, chills, fatigue and fever.  Respiratory: Negative for chest tightness and shortness of breath.   Cardiovascular: Negative for chest pain and palpitations.  Gastrointestinal: Negative for abdominal pain, nausea and vomiting.  Neurological: Negative for dizziness and weakness.      Objective    BP (!) 155/79 (BP Location: Left Arm, Patient Position: Sitting, Cuff Size: Normal)   Pulse 60   Temp 98.9 F (37.2 C) (Oral)   Wt 159 lb (72.1 kg)   BMI 26.46 kg/m    Physical Exam Vitals reviewed.  Constitutional:      General: She is not in acute distress.    Appearance: Normal appearance. She is well-developed. She is not diaphoretic.  HENT:     Head: Normocephalic and atraumatic.  Eyes:     General: No scleral icterus.    Conjunctiva/sclera: Conjunctivae normal.  Neck:     Thyroid: No thyromegaly.  Cardiovascular:     Rate and Rhythm: Normal rate and regular rhythm.     Pulses: Normal pulses.     Heart sounds: Normal heart sounds. No murmur heard.   Pulmonary:     Effort: Pulmonary effort is normal. No respiratory distress.     Breath sounds: Normal breath sounds. No wheezing, rhonchi or rales.  Musculoskeletal:     Cervical back: Neck supple.     Right lower leg: No edema.     Left lower leg: No edema.  Lymphadenopathy:     Cervical: No cervical adenopathy.  Skin:    General: Skin is warm and dry.     Findings: No rash.  Neurological:     Mental Status: She is alert and oriented to person, place, and time. Mental status is at baseline.  Psychiatric:        Mood and Affect: Mood normal.        Behavior: Behavior normal.     No results found for any visits on 01/30/20.  Assessment & Plan     Problem List Items Addressed This Visit      Cardiovascular and Mediastinum   Hypertension, essential    Chronic and uncontrolled Elevated today Continue current medications, but increase lisinopril to 40 mg daily Recheck  metabolic panel Follow-up in 1 month      Relevant Medications   lisinopril (ZESTRIL) 40 MG tablet   Other Relevant Orders   Comprehensive Metabolic Panel (CMET)   CBC   Paroxysmal atrial fibrillation (HCC)    Followed by cardiology Currently normal sinus rhythm History of ablations Previously on chronic anticoagulation, but it was discontinued after 2 episodes of acute severe anemia Continue aspirin      Relevant Medications   lisinopril (ZESTRIL) 40 MG tablet   Senile purpura (HCC)    Chronic and stable Continue to monitor      Relevant Medications   lisinopril (ZESTRIL) 40 MG tablet   Atherosclerosis of native coronary artery of native heart with stable angina pectoris (HCC)    Followed by cardiology Status post 3 stent placement Asymptomatic Medically managed Need to improve blood pressure control as below Continue statin      Relevant Medications   lisinopril (ZESTRIL) 40 MG tablet   gabapentin (NEURONTIN) 600 MG tablet     Respiratory   COPD (chronic obstructive pulmonary disease) (HCC)    Chronic and mild Well-controlled No recent exacerbation Not on a controller Continue albuterol as needed Encouraged tobacco cessation        Digestive   PUD (peptic ulcer disease)    History of Continue PPI Recheck CBC      Relevant Orders   CBC     Endocrine   Hypothyroidism - Primary    Previously well controlled Continue Synthroid at current dose  Recheck TSH and adjust Synthroid as indicated        Relevant Orders   TSH     Nervous and Auditory   Neuropathy    Chronic and worsening Previously well controlled continue gabapentin She states she had difficulty getting refills of this and stopped about 2 months ago Let patient know that she needs to tell us when she is having difficulty getting refills as we did not know about this Resume gabapentin at previous dose Reviewed GFR      Relevant Medications   gabapentin (NEURONTIN) 600 MG tablet      Other   Hyperlipemia, mixed    Continue statin Goal LDL less  than 70 in the setting of heart disease Recheck FLP and CMP      Relevant Medications   lisinopril (ZESTRIL) 40 MG tablet   Other Relevant Orders   Comprehensive Metabolic Panel (CMET)   Lipid panel   Prediabetes    Encourage low-carb diet Recheck A1c      Relevant Orders   Hemoglobin A1C    Other Visit Diagnoses    Iron deficiency anemia, unspecified iron deficiency anemia type       Relevant Orders   CBC   Need for influenza vaccination       Relevant Orders   Flu Vaccine QUAD High Dose IM (Fluad) (Completed)       Return in about 6 months (around 07/29/2020) for AWV, CPE.      I, Eileen Latch, MD, have reviewed all documentation for this visit. The documentation on 01/30/20 for the exam, diagnosis, procedures, and orders are all accurate and complete.   Eileen Mack, Marzella Schlein, MD, MPH [facility]

## 2020-01-30 NOTE — Assessment & Plan Note (Signed)
Continue statin Goal LDL less than 70 in the setting of heart disease Recheck FLP and CMP

## 2020-01-30 NOTE — Assessment & Plan Note (Signed)
Chronic and stable Continue to monitor 

## 2020-01-30 NOTE — Assessment & Plan Note (Signed)
Followed by cardiology Currently normal sinus rhythm History of ablations Previously on chronic anticoagulation, but it was discontinued after 2 episodes of acute severe anemia Continue aspirin

## 2020-01-30 NOTE — Assessment & Plan Note (Signed)
Previously well controlled Continue Synthroid at current dose  Recheck TSH and adjust Synthroid as indicated   

## 2020-01-30 NOTE — Assessment & Plan Note (Signed)
Chronic and worsening Previously well controlled continue gabapentin She states she had difficulty getting refills of this and stopped about 2 months ago Let patient know that she needs to tell us when she is having difficulty getting refills as we did not know about this Resume gabapentin at previous dose Reviewed GFR

## 2020-01-30 NOTE — Assessment & Plan Note (Signed)
Followed by cardiology Status post 3 stent placement Asymptomatic Medically managed Need to improve blood pressure control as below Continue statin

## 2020-01-30 NOTE — Assessment & Plan Note (Signed)
History of Continue PPI Recheck CBC

## 2020-01-30 NOTE — Assessment & Plan Note (Signed)
Chronic and uncontrolled Elevated today Continue current medications, but increase lisinopril to 40 mg daily Recheck metabolic panel Follow-up in 1 month

## 2020-01-30 NOTE — Assessment & Plan Note (Signed)
Encourage low carb diet Recheck A1c 

## 2020-01-30 NOTE — Assessment & Plan Note (Signed)
Chronic and mild Well-controlled No recent exacerbation Not on a controller Continue albuterol as needed Encouraged tobacco cessation

## 2020-02-13 ENCOUNTER — Telehealth: Payer: Self-pay

## 2020-02-13 DIAGNOSIS — E782 Mixed hyperlipidemia: Secondary | ICD-10-CM

## 2020-02-13 DIAGNOSIS — E039 Hypothyroidism, unspecified: Secondary | ICD-10-CM

## 2020-02-13 LAB — COMPREHENSIVE METABOLIC PANEL
ALT: 8 IU/L (ref 0–32)
AST: 17 IU/L (ref 0–40)
Albumin/Globulin Ratio: 1.7 (ref 1.2–2.2)
Albumin: 4.3 g/dL (ref 3.6–4.6)
Alkaline Phosphatase: 112 IU/L (ref 44–121)
BUN/Creatinine Ratio: 17 (ref 12–28)
BUN: 18 mg/dL (ref 8–27)
Bilirubin Total: 0.4 mg/dL (ref 0.0–1.2)
CO2: 24 mmol/L (ref 20–29)
Calcium: 9.3 mg/dL (ref 8.7–10.3)
Chloride: 101 mmol/L (ref 96–106)
Creatinine, Ser: 1.06 mg/dL — ABNORMAL HIGH (ref 0.57–1.00)
GFR calc Af Amer: 57 mL/min/{1.73_m2} — ABNORMAL LOW (ref >59)
GFR calc non Af Amer: 49 mL/min/{1.73_m2} — ABNORMAL LOW (ref >59)
Globulin, Total: 2.6 g/dL (ref 1.5–4.5)
Glucose: 106 mg/dL — ABNORMAL HIGH (ref 65–99)
Potassium: 3.9 mmol/L (ref 3.5–5.2)
Sodium: 141 mmol/L (ref 134–144)
Total Protein: 6.9 g/dL (ref 6.0–8.5)

## 2020-02-13 LAB — LIPID PANEL
Chol/HDL Ratio: 2.7 ratio (ref 0.0–4.4)
Cholesterol, Total: 168 mg/dL (ref 100–199)
HDL: 62 mg/dL (ref >39)
LDL Chol Calc (NIH): 96 mg/dL (ref 0–99)
Triglycerides: 46 mg/dL (ref 0–149)
VLDL Cholesterol Cal: 10 mg/dL (ref 5–40)

## 2020-02-13 LAB — CBC
Hematocrit: 32.4 % — ABNORMAL LOW (ref 34.0–46.6)
Hemoglobin: 10.9 g/dL — ABNORMAL LOW (ref 11.1–15.9)
MCH: 30.4 pg (ref 26.6–33.0)
MCHC: 33.6 g/dL (ref 31.5–35.7)
MCV: 91 fL (ref 79–97)
Platelets: 275 10*3/uL (ref 150–450)
RBC: 3.58 x10E6/uL — ABNORMAL LOW (ref 3.77–5.28)
RDW: 12.8 % (ref 11.7–15.4)
WBC: 4.3 10*3/uL (ref 3.4–10.8)

## 2020-02-13 LAB — TSH: TSH: 5.19 u[IU]/mL — ABNORMAL HIGH (ref 0.450–4.500)

## 2020-02-13 LAB — HEMOGLOBIN A1C
Est. average glucose Bld gHb Est-mCnc: 137 mg/dL
Hgb A1c MFr Bld: 6.4 % — ABNORMAL HIGH (ref 4.8–5.6)

## 2020-02-13 MED ORDER — ATORVASTATIN CALCIUM 40 MG PO TABS: 40.0000 mg | ORAL_TABLET | Freq: Every day | ORAL | 1 refills | Status: DC

## 2020-02-13 MED ORDER — LEVOTHYROXINE SODIUM 112 MCG PO TABS: 112.0000 ug | ORAL_TABLET | Freq: Every day | ORAL | 1 refills | Status: DC

## 2020-02-13 NOTE — Telephone Encounter (Signed)
-----   Message from Erasmo Downer, MD sent at 02/13/2020 10:43 AM EDT ----- Stable kidney function, liver function, electrolytes, Blood counts.  TSH is high.  If patient is taking her levothyroxine with good compliance, then we would need to increase the dose from 100 mcg daily to 112 mcg daily.  Okay to send a new prescription for 90-day supply with 1 refill.  Would recommend rechecking TSH in 2 to 3 months.  Hemoglobin A1c remains in the prediabetic range, but is edging very close to diabetes.  Recommend low-carb diet and regular exercise to lower this and avoid progression to diabetes.  Cholesterol is high in the setting of her known atherosclerosis.  Goal LDL, bad cholesterol, is less than 70.  Would recommend switching pravastatin to atorvastatin 40 mg daily and rechecking lipid panel in about 3 months as well.

## 2020-02-13 NOTE — Telephone Encounter (Signed)
Patient advised. RX sent to pharmacy. Labs ordered as a future order.

## 2020-02-17 ENCOUNTER — Ambulatory Visit: Payer: Medicare HMO

## 2020-02-17 ENCOUNTER — Other Ambulatory Visit: Payer: Self-pay | Admitting: Family Medicine

## 2020-02-18 ENCOUNTER — Ambulatory Visit: Payer: Medicare HMO | Admitting: Oncology

## 2020-02-20 ENCOUNTER — Ambulatory Visit: Payer: Medicare HMO | Admitting: Oncology

## 2020-02-27 ENCOUNTER — Ambulatory Visit: Payer: Self-pay | Admitting: Family Medicine

## 2020-02-27 ENCOUNTER — Ambulatory Visit: Payer: Self-pay

## 2020-03-02 ENCOUNTER — Telehealth: Payer: Self-pay

## 2020-03-02 NOTE — Telephone Encounter (Signed)
Copied from CRM [id number]. Topic: General - Inquiry >> Feb 27, 2020  5:20 PM Adrian Prince D wrote: Reason for CRM: She wants me to let Dr. B know that she haven't gotten a chance to take the test because they told her that they needed prior authorization from her insurance company. She hasn't heard a word. She did miss her appointment today but not intentionally, she misplaced her phone. She said she will wait until she gets the test done before rescheduling. She can be reached at [phone]. Please advise

## 2020-03-03 NOTE — Telephone Encounter (Signed)
I'm not sure what test we are talking about.  Doesn't look like I ordered any imaging at last visit. We did get labs.

## 2020-03-12 ENCOUNTER — Other Ambulatory Visit: Payer: Self-pay | Admitting: Family Medicine

## 2020-03-19 ENCOUNTER — Telehealth: Payer: Self-pay | Admitting: *Deleted

## 2020-03-19 NOTE — Telephone Encounter (Signed)
CT scan approved through insurance. Appts rescheduled. Attempted to contact pt to review scheduled appts in the lung nodule clinic. Pt did not answer and unable to leave message. Appts mailed.

## 2020-03-22 IMAGING — US US AORTA SCREENING (MEDICARE)
1 series · 14 of 25 positions shown · non-contrast
Comparison: None.

CLINICAL DATA: 81-year-old female with a history of aneurysm

EXAM:
US ABDOMINAL AORTA MEDICARE SCREENING
TECHNIQUE: Ultrasound examination of the abdominal aorta was performed as a
screening evaluation for abdominal aortic aneurysm.

[Series 1: us aorta screening (medicare) · 14 of 27 slices shown]
[im 1/27]
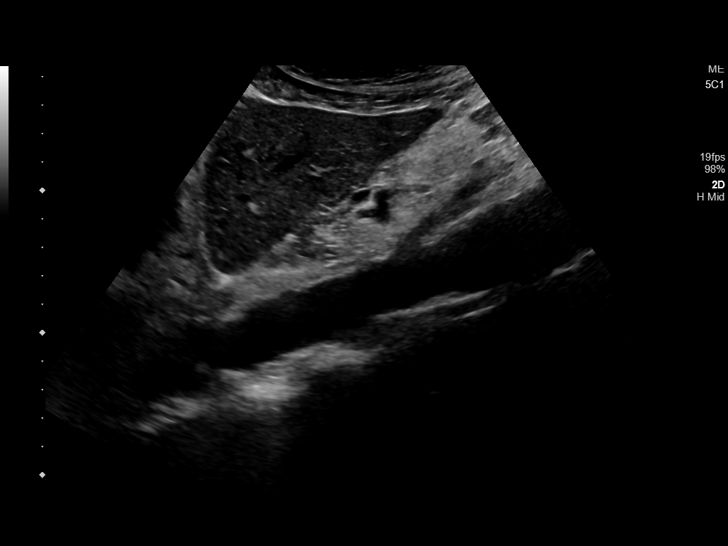
[im 3/27]
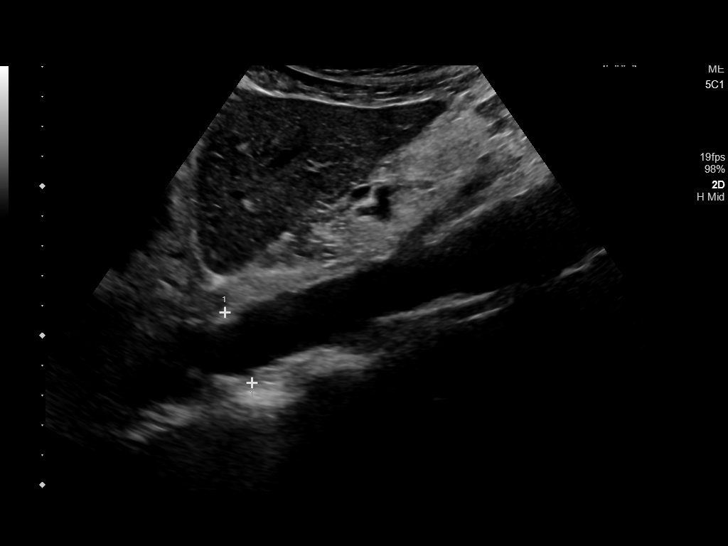
[im 5/27]
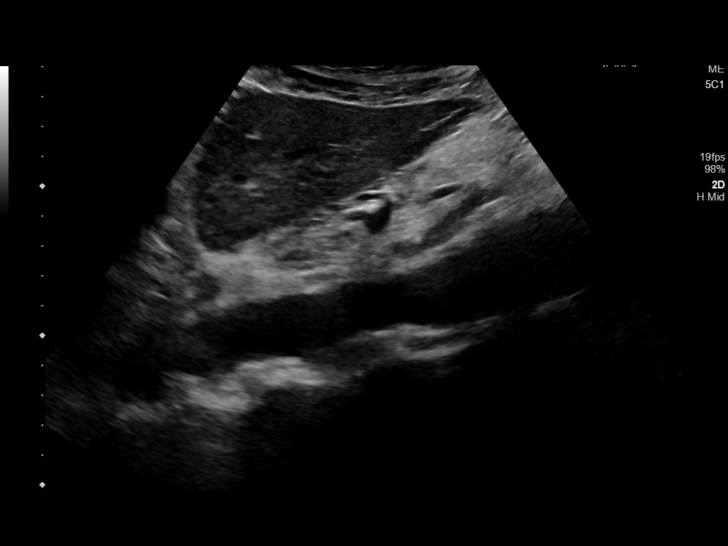
[im 7/27]
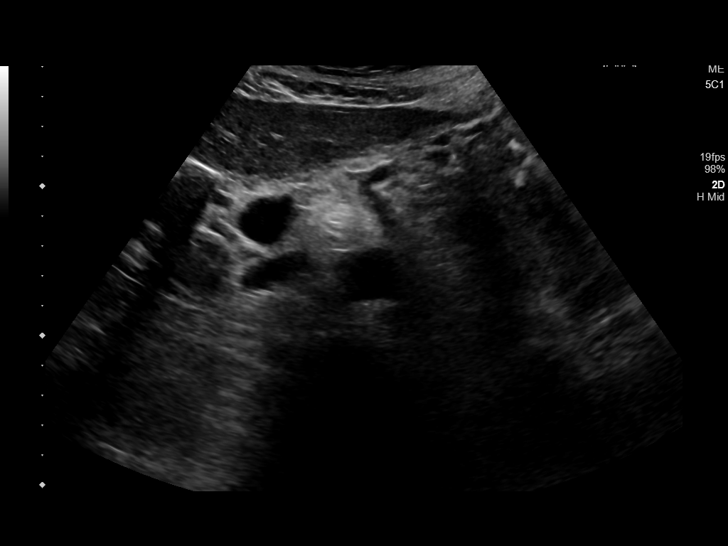
[im 9/27]
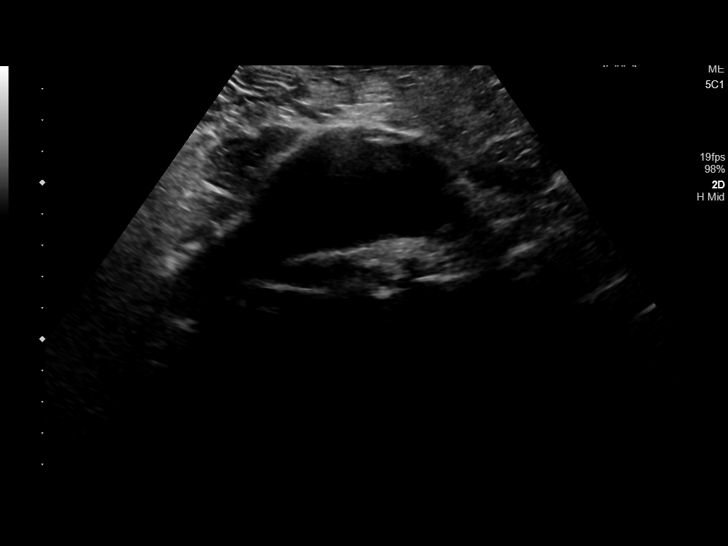
[im 10/27]
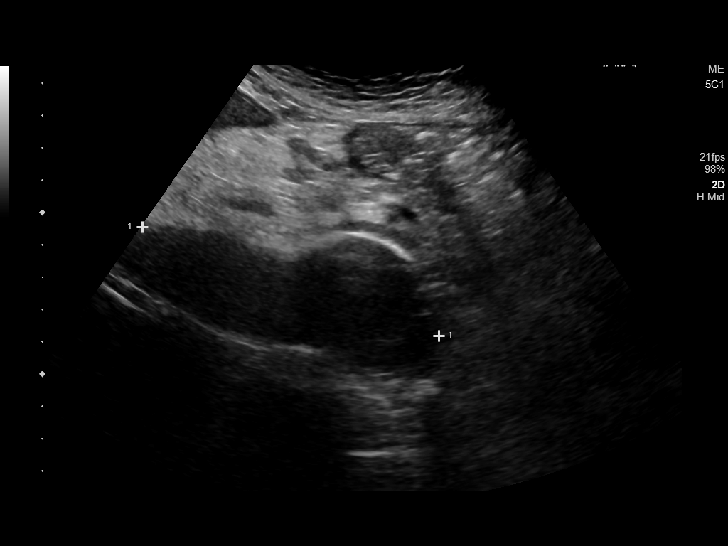
[im 12/27]
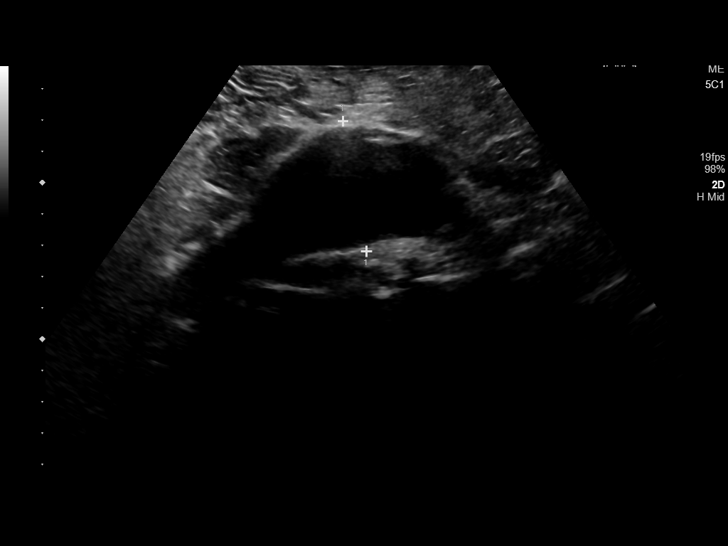
[im 15/27]
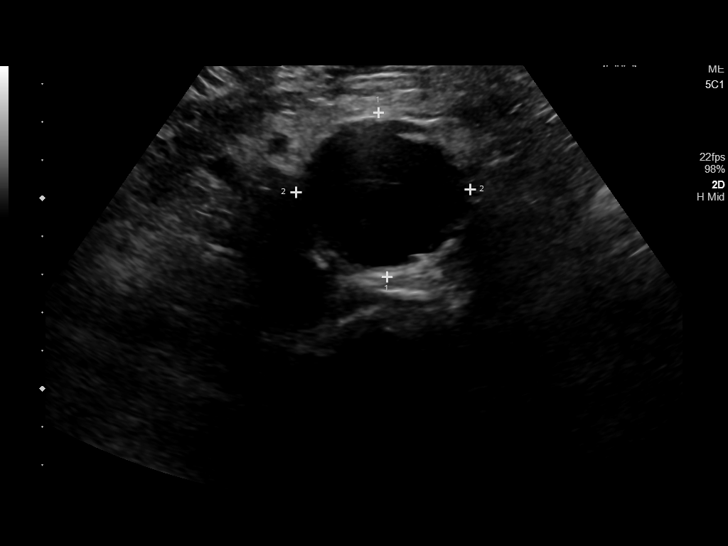
[im 17/27]
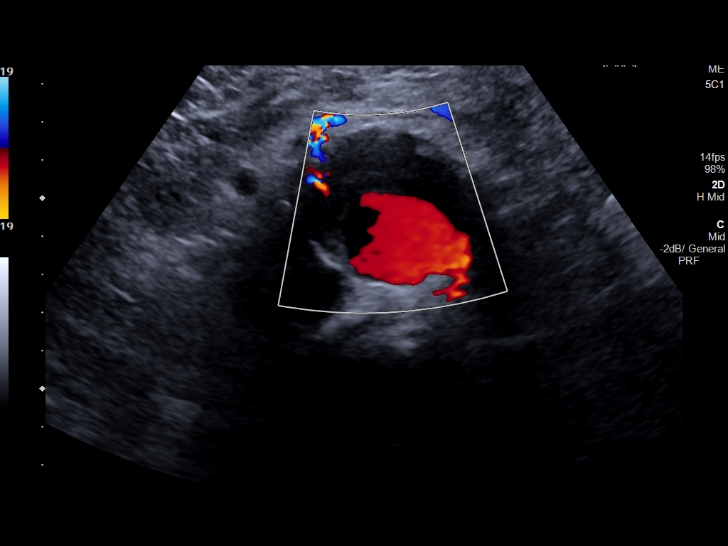
[im 18/27]
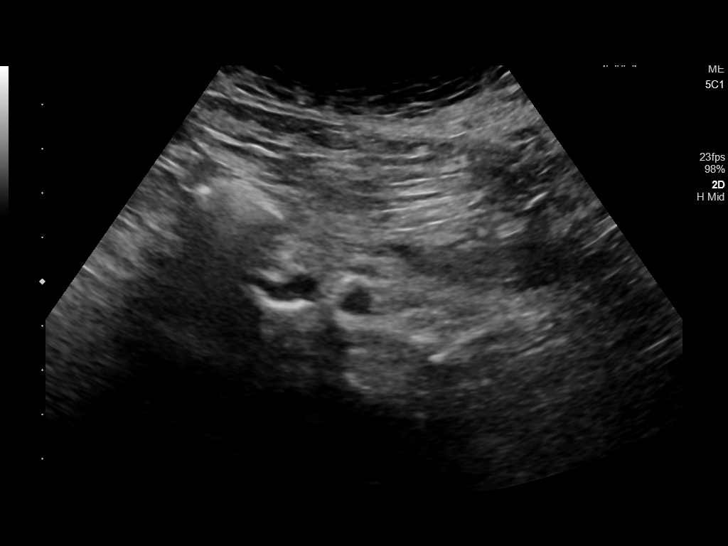
[im 20/27]
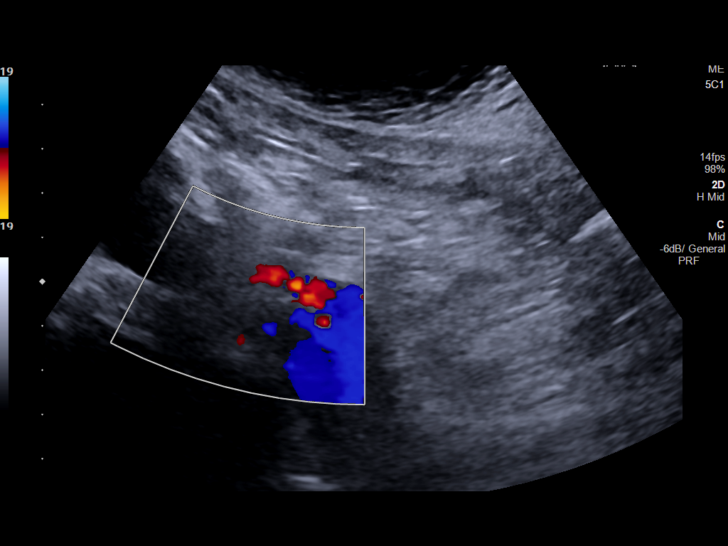
[im 22/27]
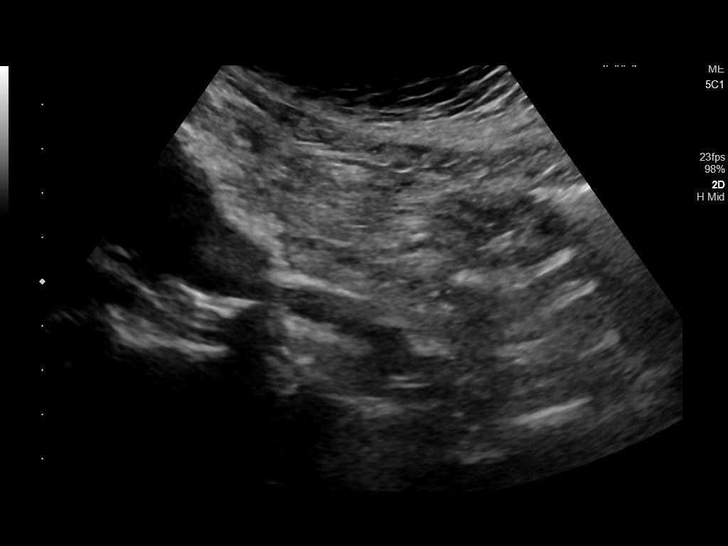
[im 24/27]
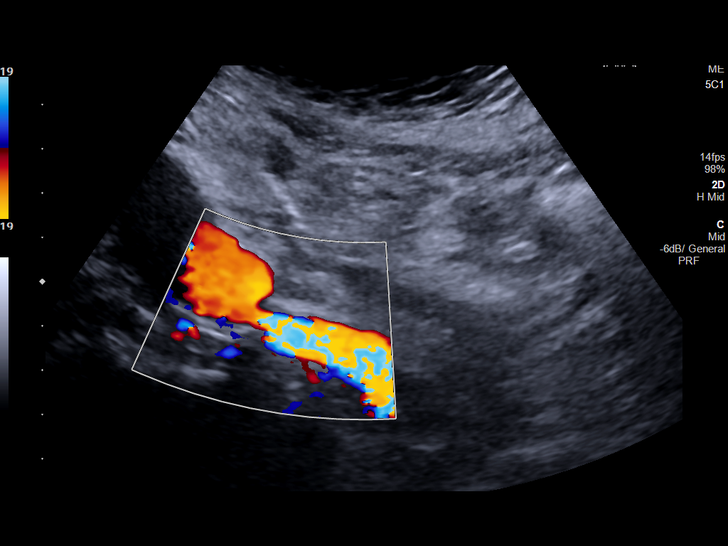
[im 27/27]
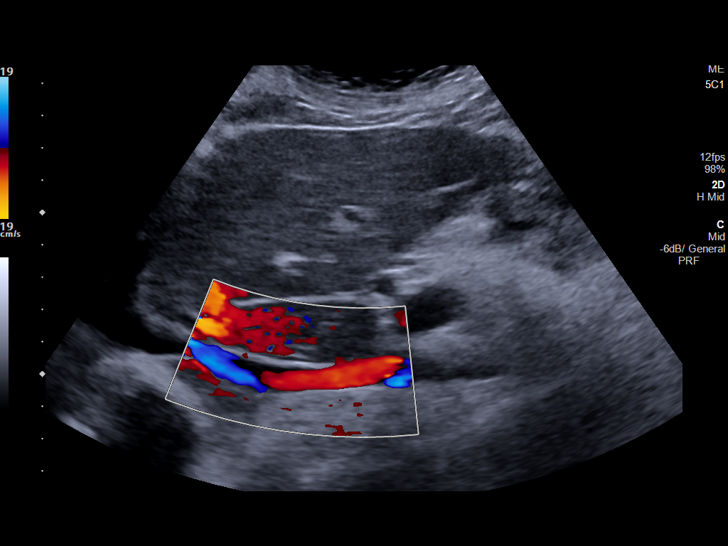

[14 of 25 positions shown; findings below may reference images not displayed]

FINDINGS: Abdominal aortic measurements as follows:

Proximal:  2.5 cm

Mid:  2.1 cm

Distal:  4.6 cm

Right iliac artery: 1.2 cm

Left iliac artery: 1.1 cm
IMPRESSION: Infrarenal abdominal aortic aneurysm measuring 4.6 cm. Recommend
followup by abdomen and pelvis CTA in 6 months, and vascular
referral/consultation if not already obtained. This recommendation
follows ACR consensus guidelines: White Paper of the ACR [employer]

## 2020-03-24 ENCOUNTER — Telehealth: Payer: Self-pay | Admitting: *Deleted

## 2020-03-24 NOTE — Telephone Encounter (Signed)
Attempted to contact pt to update on rescheduled appts. Unable to contact pt on mobile or home phone number. Updated appts mailed to pt.

## 2020-03-30 ENCOUNTER — Ambulatory Visit: Payer: Medicare HMO | Attending: Oncology

## 2020-03-31 ENCOUNTER — Ambulatory Visit: Payer: Medicare HMO | Admitting: Oncology

## 2020-04-01 ENCOUNTER — Inpatient Hospital Stay: Payer: Medicare HMO | Admitting: Oncology

## 2020-04-02 ENCOUNTER — Other Ambulatory Visit: Payer: Self-pay

## 2020-04-02 ENCOUNTER — Inpatient Hospital Stay
Admission: EM | Admit: 2020-04-02 | Discharge: 2020-04-03 | DRG: 286 | Disposition: A | Discharge status: Home / Self Care | Payer: Medicare HMO | Attending: Internal Medicine | Admitting: Internal Medicine

## 2020-04-02 ENCOUNTER — Inpatient Hospital Stay
Admit: 2020-04-02 | Discharge: 2020-04-02 | Disposition: A | Discharge status: Home / Self Care | Payer: Medicare HMO | Attending: Pulmonary Disease | Admitting: Pulmonary Disease

## 2020-04-02 ENCOUNTER — Emergency Department: Payer: Medicare HMO

## 2020-04-02 DIAGNOSIS — Z720 Tobacco use: Secondary | ICD-10-CM

## 2020-04-02 DIAGNOSIS — I5032 Chronic diastolic (congestive) heart failure: Secondary | ICD-10-CM | POA: Diagnosis present

## 2020-04-02 DIAGNOSIS — Z79899 Other long term (current) drug therapy: Secondary | ICD-10-CM

## 2020-04-02 DIAGNOSIS — I739 Peripheral vascular disease, unspecified: Secondary | ICD-10-CM | POA: Diagnosis present

## 2020-04-02 DIAGNOSIS — R443 Hallucinations, unspecified: Secondary | ICD-10-CM

## 2020-04-02 DIAGNOSIS — J441 Chronic obstructive pulmonary disease with (acute) exacerbation: Secondary | ICD-10-CM

## 2020-04-02 DIAGNOSIS — Z91041 Radiographic dye allergy status: Secondary | ICD-10-CM | POA: Diagnosis not present

## 2020-04-02 DIAGNOSIS — F17210 Nicotine dependence, cigarettes, uncomplicated: Secondary | ICD-10-CM | POA: Diagnosis present

## 2020-04-02 DIAGNOSIS — J9602 Acute respiratory failure with hypercapnia: Secondary | ICD-10-CM | POA: Diagnosis present

## 2020-04-02 DIAGNOSIS — F32A Depression, unspecified: Secondary | ICD-10-CM | POA: Diagnosis present

## 2020-04-02 DIAGNOSIS — E872 Acidosis, unspecified: Secondary | ICD-10-CM

## 2020-04-02 DIAGNOSIS — Z955 Presence of coronary angioplasty implant and graft: Secondary | ICD-10-CM

## 2020-04-02 DIAGNOSIS — I714 Abdominal aortic aneurysm, without rupture: Secondary | ICD-10-CM | POA: Diagnosis present

## 2020-04-02 DIAGNOSIS — I110 Hypertensive heart disease with heart failure: Secondary | ICD-10-CM | POA: Diagnosis present

## 2020-04-02 DIAGNOSIS — I25110 Atherosclerotic heart disease of native coronary artery with unstable angina pectoris: Secondary | ICD-10-CM | POA: Diagnosis present

## 2020-04-02 DIAGNOSIS — I10 Essential (primary) hypertension: Secondary | ICD-10-CM

## 2020-04-02 DIAGNOSIS — I248 Other forms of acute ischemic heart disease: Secondary | ICD-10-CM | POA: Diagnosis present

## 2020-04-02 DIAGNOSIS — R778 Other specified abnormalities of plasma proteins: Secondary | ICD-10-CM

## 2020-04-02 DIAGNOSIS — T380X5A Adverse effect of glucocorticoids and synthetic analogues, initial encounter: Secondary | ICD-10-CM | POA: Diagnosis not present

## 2020-04-02 DIAGNOSIS — M1990 Unspecified osteoarthritis, unspecified site: Secondary | ICD-10-CM | POA: Diagnosis present

## 2020-04-02 DIAGNOSIS — Z79890 Hormone replacement therapy (postmenopausal): Secondary | ICD-10-CM

## 2020-04-02 DIAGNOSIS — D649 Anemia, unspecified: Secondary | ICD-10-CM | POA: Diagnosis present

## 2020-04-02 DIAGNOSIS — Z20822 Contact with and (suspected) exposure to covid-19: Secondary | ICD-10-CM | POA: Diagnosis present

## 2020-04-02 DIAGNOSIS — E785 Hyperlipidemia, unspecified: Secondary | ICD-10-CM | POA: Diagnosis present

## 2020-04-02 DIAGNOSIS — Z882 Allergy status to sulfonamides status: Secondary | ICD-10-CM

## 2020-04-02 DIAGNOSIS — J9601 Acute respiratory failure with hypoxia: Secondary | ICD-10-CM | POA: Diagnosis present

## 2020-04-02 DIAGNOSIS — I249 Acute ischemic heart disease, unspecified: Secondary | ICD-10-CM | POA: Diagnosis not present

## 2020-04-02 DIAGNOSIS — Z66 Do not resuscitate: Secondary | ICD-10-CM | POA: Diagnosis present

## 2020-04-02 DIAGNOSIS — Z86718 Personal history of other venous thrombosis and embolism: Secondary | ICD-10-CM

## 2020-04-02 DIAGNOSIS — I447 Left bundle-branch block, unspecified: Secondary | ICD-10-CM | POA: Diagnosis present

## 2020-04-02 DIAGNOSIS — J9600 Acute respiratory failure, unspecified whether with hypoxia or hypercapnia: Secondary | ICD-10-CM | POA: Diagnosis present

## 2020-04-02 DIAGNOSIS — I480 Paroxysmal atrial fibrillation: Secondary | ICD-10-CM | POA: Diagnosis present

## 2020-04-02 DIAGNOSIS — K219 Gastro-esophageal reflux disease without esophagitis: Secondary | ICD-10-CM | POA: Diagnosis present

## 2020-04-02 DIAGNOSIS — E039 Hypothyroidism, unspecified: Secondary | ICD-10-CM | POA: Diagnosis present

## 2020-04-02 DIAGNOSIS — Z7982 Long term (current) use of aspirin: Secondary | ICD-10-CM

## 2020-04-02 DIAGNOSIS — G4733 Obstructive sleep apnea (adult) (pediatric): Secondary | ICD-10-CM | POA: Diagnosis present

## 2020-04-02 DIAGNOSIS — Z808 Family history of malignant neoplasm of other organs or systems: Secondary | ICD-10-CM

## 2020-04-02 DIAGNOSIS — Z801 Family history of malignant neoplasm of trachea, bronchus and lung: Secondary | ICD-10-CM

## 2020-04-02 DIAGNOSIS — Z8249 Family history of ischemic heart disease and other diseases of the circulatory system: Secondary | ICD-10-CM

## 2020-04-02 HISTORY — DX: Abdominal aortic aneurysm, without rupture, unspecified: I71.40

## 2020-04-02 HISTORY — DX: Abdominal aortic aneurysm, without rupture: I71.4

## 2020-04-02 HISTORY — DX: Atherosclerotic heart disease of native coronary artery without angina pectoris: I25.10

## 2020-04-02 LAB — GLUCOSE, CAPILLARY
Glucose-Capillary: 169 mg/dL — ABNORMAL HIGH (ref 70–99)
Glucose-Capillary: 170 mg/dL — ABNORMAL HIGH (ref 70–99)
Glucose-Capillary: 175 mg/dL — ABNORMAL HIGH (ref 70–99)
Glucose-Capillary: 182 mg/dL — ABNORMAL HIGH (ref 70–99)
Glucose-Capillary: 205 mg/dL — ABNORMAL HIGH (ref 70–99)
Glucose-Capillary: 232 mg/dL — ABNORMAL HIGH (ref 70–99)

## 2020-04-02 LAB — ECHOCARDIOGRAM COMPLETE
AR max vel: 2.37 cm2
AV Area VTI: 2.66 cm2
AV Area mean vel: 2.31 cm2
AV Mean grad: 6.5 mmHg
AV Peak grad: 10.5 mmHg
Ao pk vel: 1.62 m/s
Area-P 1/2: 5.27 cm2
Calc EF: 46.6 %
Height: 66 in
S' Lateral: 3.44 cm
Single Plane A2C EF: 37.6 %
Single Plane A4C EF: 53 %
Weight: 2645.52 oz

## 2020-04-02 LAB — CBC WITH DIFFERENTIAL/PLATELET
Abs Immature Granulocytes: 0.01 10*3/uL (ref 0.00–0.07)
Basophils Absolute: 0.1 10*3/uL (ref 0.0–0.1)
Basophils Relative: 1 %
Eosinophils Absolute: 0.3 10*3/uL (ref 0.0–0.5)
Eosinophils Relative: 6 %
HCT: 33.7 % — ABNORMAL LOW (ref 36.0–46.0)
Hemoglobin: 10.8 g/dL — ABNORMAL LOW (ref 12.0–15.0)
Immature Granulocytes: 0 %
Lymphocytes Relative: 28 %
Lymphs Abs: 1.3 10*3/uL (ref 0.7–4.0)
MCH: 30.6 pg (ref 26.0–34.0)
MCHC: 32 g/dL (ref 30.0–36.0)
MCV: 95.5 fL (ref 80.0–100.0)
Monocytes Absolute: 0.4 10*3/uL (ref 0.1–1.0)
Monocytes Relative: 9 %
Neutro Abs: 2.5 10*3/uL (ref 1.7–7.7)
Neutrophils Relative %: 56 %
Platelets: 247 10*3/uL (ref 150–400)
RBC: 3.53 MIL/uL — ABNORMAL LOW (ref 3.87–5.11)
RDW: 13.5 % (ref 11.5–15.5)
WBC: 4.6 10*3/uL (ref 4.0–10.5)
nRBC: 0 % (ref 0.0–0.2)

## 2020-04-02 LAB — COMPREHENSIVE METABOLIC PANEL
ALT: 13 U/L (ref 0–44)
AST: 24 U/L (ref 15–41)
Albumin: 3.5 g/dL (ref 3.5–5.0)
Alkaline Phosphatase: 86 U/L (ref 38–126)
Anion gap: 12 (ref 5–15)
BUN: 19 mg/dL (ref 8–23)
CO2: 21 mmol/L — ABNORMAL LOW (ref 22–32)
Calcium: 8.6 mg/dL — ABNORMAL LOW (ref 8.9–10.3)
Chloride: 103 mmol/L (ref 98–111)
Creatinine, Ser: 0.99 mg/dL (ref 0.44–1.00)
GFR, Estimated: 57 mL/min — ABNORMAL LOW (ref >60)
Glucose, Bld: 192 mg/dL — ABNORMAL HIGH (ref 70–99)
Potassium: 3.7 mmol/L (ref 3.5–5.1)
Sodium: 136 mmol/L (ref 135–145)
Total Bilirubin: 0.5 mg/dL (ref 0.3–1.2)
Total Protein: 7 g/dL (ref 6.5–8.1)

## 2020-04-02 LAB — BLOOD GAS, VENOUS
Acid-base deficit: 3.2 mmol/L — ABNORMAL HIGH (ref 0.0–2.0)
Bicarbonate: 24.6 mmol/L (ref 20.0–28.0)
Delivery systems: POSITIVE
FIO2: 0.4
O2 Saturation: 68.1 %
Patient temperature: 37
pCO2, Ven: 56 mmHg (ref 44.0–60.0)
pH, Ven: 7.25 (ref 7.250–7.430)
pO2, Ven: 42 mmHg (ref 32.0–45.0)

## 2020-04-02 LAB — MAGNESIUM
Magnesium: 12 mg/dL (ref 1.7–2.4)
Magnesium: 1.7 mg/dL (ref 1.7–2.4)

## 2020-04-02 LAB — MRSA PCR SCREENING: MRSA by PCR: NEGATIVE

## 2020-04-02 LAB — RESP PANEL BY RT-PCR (FLU A&B, COVID) ARPGX2
Influenza A by PCR: NEGATIVE
Influenza B by PCR: NEGATIVE
SARS Coronavirus 2 by RT PCR: NEGATIVE

## 2020-04-02 LAB — PROCALCITONIN
Procalcitonin: <0.1 ng/mL
Procalcitonin: <0.1 ng/mL

## 2020-04-02 LAB — PROTIME-INR
INR: 1.3 — ABNORMAL HIGH (ref 0.8–1.2)
Prothrombin Time: 15.3 seconds — ABNORMAL HIGH (ref 11.4–15.2)

## 2020-04-02 LAB — LACTIC ACID, PLASMA
Lactic Acid, Venous: 3 mmol/L (ref 0.5–1.9)
Lactic Acid, Venous: 0.9 mmol/L (ref 0.5–1.9)

## 2020-04-02 LAB — BRAIN NATRIURETIC PEPTIDE: B Natriuretic Peptide: 394.4 pg/mL — ABNORMAL HIGH (ref 0.0–100.0)

## 2020-04-02 LAB — HEPARIN LEVEL (UNFRACTIONATED): Heparin Unfractionated: 0.8 IU/mL — ABNORMAL HIGH (ref 0.30–0.70)

## 2020-04-02 LAB — TROPONIN I (HIGH SENSITIVITY)
Troponin I (High Sensitivity): 101 ng/L (ref <18)
Troponin I (High Sensitivity): 458 ng/L (ref <18)
Troponin I (High Sensitivity): 529 ng/L (ref <18)
Troponin I (High Sensitivity): 525 ng/L (ref <18)

## 2020-04-02 LAB — APTT: aPTT: 26 seconds (ref 24–36)

## 2020-04-02 MED ORDER — HEPARIN BOLUS VIA INFUSION
4000.0000 [IU] | Freq: Once | INTRAVENOUS | Status: AC
  Administered 2020-04-02: 4000 [IU] via INTRAVENOUS
  Filled 2020-04-02: qty 4000

## 2020-04-02 MED ORDER — GEMFIBROZIL 600 MG PO TABS
600.0000 mg | ORAL_TABLET | Freq: Two times a day (BID) | ORAL | Status: DC
  Administered 2020-04-02 – 2020-04-03 (×3): 600 mg via ORAL
  Filled 2020-04-02 (×5): qty 1

## 2020-04-02 MED ORDER — PANTOPRAZOLE SODIUM 40 MG PO TBEC
40.0000 mg | DELAYED_RELEASE_TABLET | Freq: Every day | ORAL | Status: DC
  Administered 2020-04-02: 40 mg via ORAL
  Filled 2020-04-02: qty 1

## 2020-04-02 MED ORDER — SODIUM CHLORIDE 0.9% FLUSH: 3.0000 mL | INTRAVENOUS | Status: DC | PRN

## 2020-04-02 MED ORDER — IPRATROPIUM-ALBUTEROL 0.5-2.5 (3) MG/3ML IN SOLN
9.0000 mL | Freq: Once | RESPIRATORY_TRACT | Status: AC
  Administered 2020-04-02: 9 mL via RESPIRATORY_TRACT
  Filled 2020-04-02: qty 9

## 2020-04-02 MED ORDER — DOCUSATE SODIUM 100 MG PO CAPS
100.0000 mg | ORAL_CAPSULE | Freq: Two times a day (BID) | ORAL | Status: DC | PRN
  Administered 2020-04-03: 100 mg via ORAL
  Filled 2020-04-02: qty 1

## 2020-04-02 MED ORDER — ASPIRIN 81 MG PO CHEW
81.0000 mg | CHEWABLE_TABLET | ORAL | Status: AC
  Administered 2020-04-03: 81 mg via ORAL
  Filled 2020-04-02: qty 1

## 2020-04-02 MED ORDER — METHYLPREDNISOLONE SODIUM SUCC 40 MG IJ SOLR
40.0000 mg | Freq: Two times a day (BID) | INTRAMUSCULAR | Status: DC
  Administered 2020-04-02 – 2020-04-03 (×3): 40 mg via INTRAVENOUS
  Filled 2020-04-02 (×3): qty 1

## 2020-04-02 MED ORDER — LEVOTHYROXINE SODIUM 112 MCG PO TABS
112.0000 ug | ORAL_TABLET | Freq: Every day | ORAL | Status: DC
  Administered 2020-04-02 – 2020-04-03 (×2): 112 ug via ORAL
  Filled 2020-04-02 (×3): qty 1

## 2020-04-02 MED ORDER — CHLORHEXIDINE GLUCONATE CLOTH 2 % EX PADS
6.0000 | MEDICATED_PAD | Freq: Every day | CUTANEOUS | Status: DC
  Administered 2020-04-02 – 2020-04-03 (×2): 6 via TOPICAL

## 2020-04-02 MED ORDER — GABAPENTIN 600 MG PO TABS: 600.0000 mg | ORAL_TABLET | Freq: Two times a day (BID) | ORAL | Status: DC

## 2020-04-02 MED ORDER — SODIUM CHLORIDE 0.9 % IV SOLN
1.0000 g | Freq: Once | INTRAVENOUS | Status: AC
  Administered 2020-04-02: 1 g via INTRAVENOUS
  Filled 2020-04-02: qty 10

## 2020-04-02 MED ORDER — IPRATROPIUM-ALBUTEROL 0.5-2.5 (3) MG/3ML IN SOLN
3.0000 mL | Freq: Four times a day (QID) | RESPIRATORY_TRACT | Status: DC
  Administered 2020-04-02 – 2020-04-03 (×5): 3 mL via RESPIRATORY_TRACT
  Filled 2020-04-02 (×4): qty 3

## 2020-04-02 MED ORDER — SODIUM CHLORIDE 0.9% FLUSH
3.0000 mL | Freq: Two times a day (BID) | INTRAVENOUS | Status: DC
  Administered 2020-04-02 – 2020-04-03 (×3): 3 mL via INTRAVENOUS

## 2020-04-02 MED ORDER — ATORVASTATIN CALCIUM 20 MG PO TABS
40.0000 mg | ORAL_TABLET | Freq: Every day | ORAL | Status: DC
  Administered 2020-04-02 – 2020-04-03 (×2): 40 mg via ORAL
  Filled 2020-04-02 (×2): qty 2

## 2020-04-02 MED ORDER — ACETAMINOPHEN 325 MG PO TABS
650.0000 mg | ORAL_TABLET | ORAL | Status: DC | PRN
  Administered 2020-04-02 (×2): 650 mg via ORAL
  Filled 2020-04-02 (×2): qty 2

## 2020-04-02 MED ORDER — FAMOTIDINE 20 MG PO TABS
40.0000 mg | ORAL_TABLET | Freq: Four times a day (QID) | ORAL | Status: AC
  Administered 2020-04-02 – 2020-04-03 (×4): 40 mg via ORAL
  Filled 2020-04-02 (×4): qty 2

## 2020-04-02 MED ORDER — ONDANSETRON HCL 4 MG/2ML IJ SOLN: 4.0000 mg | Freq: Four times a day (QID) | INTRAMUSCULAR | Status: DC | PRN

## 2020-04-02 MED ORDER — ASPIRIN EC 81 MG PO TBEC
81.0000 mg | DELAYED_RELEASE_TABLET | Freq: Every day | ORAL | Status: DC
  Administered 2020-04-02 – 2020-04-03 (×2): 81 mg via ORAL
  Filled 2020-04-02 (×2): qty 1

## 2020-04-02 MED ORDER — LABETALOL HCL 5 MG/ML IV SOLN
10.0000 mg | Freq: Once | INTRAVENOUS | Status: AC
  Administered 2020-04-02: 10 mg via INTRAVENOUS
  Filled 2020-04-02: qty 4

## 2020-04-02 MED ORDER — ASPIRIN 81 MG PO CHEW
324.0000 mg | CHEWABLE_TABLET | Freq: Once | ORAL | Status: AC
  Administered 2020-04-02: 324 mg via ORAL
  Filled 2020-04-02: qty 4

## 2020-04-02 MED ORDER — BUDESONIDE 0.25 MG/2ML IN SUSP
0.2500 mg | Freq: Two times a day (BID) | RESPIRATORY_TRACT | Status: DC
  Administered 2020-04-02 (×2): 0.25 mg via RESPIRATORY_TRACT
  Filled 2020-04-02 (×3): qty 2

## 2020-04-02 MED ORDER — SODIUM CHLORIDE 0.9 % WEIGHT BASED INFUSION
3.0000 mL/kg/h | INTRAVENOUS | Status: AC
  Administered 2020-04-03: 3 mL/kg/h via INTRAVENOUS

## 2020-04-02 MED ORDER — LISINOPRIL 20 MG PO TABS: 40.0000 mg | ORAL_TABLET | Freq: Every day | ORAL | Status: DC

## 2020-04-02 MED ORDER — SODIUM CHLORIDE 0.9 % WEIGHT BASED INFUSION
1.0000 mL/kg/h | INTRAVENOUS | Status: DC
  Administered 2020-04-03 (×2): 1 mL/kg/h via INTRAVENOUS

## 2020-04-02 MED ORDER — POLYETHYLENE GLYCOL 3350 17 G PO PACK: 17.0000 g | PACK | Freq: Every day | ORAL | Status: DC | PRN

## 2020-04-02 MED ORDER — NITROGLYCERIN 0.4 MG SL SUBL: 0.4000 mg | SUBLINGUAL_TABLET | SUBLINGUAL | Status: DC | PRN

## 2020-04-02 MED ORDER — CARVEDILOL 25 MG PO TABS
25.0000 mg | ORAL_TABLET | Freq: Two times a day (BID) | ORAL | Status: DC
  Administered 2020-04-02 – 2020-04-03 (×3): 25 mg via ORAL
  Filled 2020-04-02 (×3): qty 1

## 2020-04-02 MED ORDER — DIPHENHYDRAMINE HCL 50 MG/ML IJ SOLN
50.0000 mg | Freq: Four times a day (QID) | INTRAMUSCULAR | Status: AC
  Administered 2020-04-02 – 2020-04-03 (×4): 50 mg via INTRAVENOUS
  Filled 2020-04-02 (×4): qty 1

## 2020-04-02 MED ORDER — SODIUM CHLORIDE 0.9 % IV SOLN: 250.0000 mL | INTRAVENOUS | Status: DC | PRN

## 2020-04-02 MED ORDER — HEPARIN (PORCINE) 25000 UT/250ML-% IV SOLN
800.0000 [IU]/h | INTRAVENOUS | Status: DC
  Administered 2020-04-02: 900 [IU]/h via INTRAVENOUS
  Filled 2020-04-02: qty 250

## 2020-04-02 MED ORDER — ENOXAPARIN SODIUM 40 MG/0.4ML ~~LOC~~ SOLN: 40.0000 mg | SUBCUTANEOUS | Status: DC

## 2020-04-02 MED ORDER — CLONIDINE HCL 0.1 MG PO TABS
0.2000 mg | ORAL_TABLET | Freq: Two times a day (BID) | ORAL | Status: DC
  Administered 2020-04-02 – 2020-04-03 (×3): 0.2 mg via ORAL
  Filled 2020-04-02 (×3): qty 2

## 2020-04-02 MED ORDER — SODIUM CHLORIDE 0.9 % IV SOLN
500.0000 mg | Freq: Once | INTRAVENOUS | Status: AC
  Administered 2020-04-02: 500 mg via INTRAVENOUS
  Filled 2020-04-02: qty 500

## 2020-04-02 NOTE — Consult Note (Signed)
CARDIOLOGY CONSULT NOTE               Patient ID: Eileen Mack MRN: [id number] DOB/AGE: [birth year] 84 y.o.  Admit date: 04/02/2020 Referring Physician Dr. Karna Christmas Primary Physician Dr Shirlee Latch Primary Cardiologist Fath Reason for Consultation unstable angina shortness of breath  HPI: History of known coronary disease history of PCI and stent in the past LAD and RCA patient has a history of AAA in the past atrial fibrillation.  COPD with acute on chronic respiratory failure requiring aggressive supportive care.  Patient has history of hypertension hyperlipidemia GERD.  Patient has a history of DVTs history of tobacco abuse with worsening recurrent anginal symptoms the patient was brought to the emergency room for evaluation as well as respiratory failure  Review of systems complete and found to be negative unless listed above     Past Medical History:  Diagnosis Date  . AAA (abdominal aortic aneurysm) (HCC)   . Allergy   . Anemia   . Atrial fibrillation (HCC)   . Blood transfusion without reported diagnosis   . CAD (coronary artery disease)    s/p PCI to LAD & RCA  . COPD (chronic obstructive pulmonary disease) (HCC)   . Depression   . Emphysema of lung (HCC)   . GERD (gastroesophageal reflux disease)   . History of blood clots   . Hyperlipidemia   . Hypertension   . Personal history of tobacco use, presenting hazards to health 07/01/2015  . Renal disorder   . Thyroid disease     Past Surgical History:  Procedure Laterality Date  . ABDOMINAL HYSTERECTOMY    . APPENDECTOMY    . CARDIAC SURGERY    . CHOLECYSTECTOMY    . COLONOSCOPY WITH PROPOFOL Bilateral 08/01/2015   Procedure: COLONOSCOPY WITH PROPOFOL;  Surgeon: Scot Jun, MD;  Location: [facility] ENDOSCOPY;  Service: Endoscopy;  Laterality: Bilateral;  . ENDOVASCULAR REPAIR/STENT GRAFT N/A 02/21/2018   Procedure: ENDOVASCULAR REPAIR/STENT GRAFT;  Surgeon: Renford Dills, MD;  Location: [facility]  INVASIVE CV LAB;  Service: Cardiovascular;  Laterality: N/A;  . ESOPHAGOGASTRODUODENOSCOPY N/A 07/31/2015   Procedure: ESOPHAGOGASTRODUODENOSCOPY (EGD);  Surgeon: Scot Jun, MD;  Location: [facility] ENDOSCOPY;  Service: Endoscopy;  Laterality: N/A;  . EYE SURGERY      Medications Prior to Admission  Medication Sig Dispense Refill Last Dose  . aspirin EC 81 MG tablet Take 81 mg by mouth daily.   04/01/2020 at 0800  . carvedilol (COREG) 25 MG tablet Take 1 tablet (25 mg total) by mouth 2 (two) times daily with a meal. 180 tablet 1 04/01/2020 at 0800  . cloNIDine (CATAPRES) 0.2 MG tablet Take 1 tablet (0.2 mg total) by mouth 2 (two) times daily. 180 tablet 1 04/01/2020 at 0800  . ferrous sulfate 325 (65 FE) MG tablet Take 1 tablet (325 mg total) by mouth daily with breakfast. 60 tablet 3 04/01/2020 at 0800  . fluticasone (FLONASE) 50 MCG/ACT nasal spray Place into the nose.   Past Week at Unknown time  . gabapentin (NEURONTIN) 600 MG tablet Take 1 tablet (600 mg total) by mouth 2 (two) times daily. 180 tablet 1 04/01/2020 at 0800  . gemfibrozil (LOPID) 600 MG tablet TAKE 1 TABLET BY MOUTH TWICE DAILY BEFORE A MEAL (Patient taking differently: Take 600 mg by mouth 2 (two) times daily before a meal. ) 180 tablet 3 04/01/2020 at 0800  . levothyroxine (SYNTHROID) 112 MCG tablet Take 1 tablet (112 mcg total) by mouth daily. 90  tablet 1 04/01/2020 at 0730  . lisinopril (ZESTRIL) 40 MG tablet Take 1 tablet (40 mg total) by mouth daily. 90 tablet 0 04/01/2020 at 0800  . Multiple Vitamins-Minerals (MULTIVITAMIN GUMMIES ADULT PO) Take 1 each by mouth daily.   Past Week at Unknown time  . nitroGLYCERIN (NITROSTAT) 0.4 MG SL tablet Place 0.4 mg under the tongue every 5 (five) minutes as needed for chest pain.   Past Week at prn  . omeprazole (PRILOSEC) 20 MG capsule Take 1 capsule by mouth once daily (Patient taking differently: Take 20 mg by mouth daily. ) 90 capsule 0 04/01/2020 at 0800  . Venlafaxine HCl 225 MG TB24  Take 1 tablet by mouth once daily (Patient taking differently: Take 225 mg by mouth daily. ) 90 tablet 1 04/01/2020 at 0800  . acetaminophen (ACETAMINOPHEN 8 HOUR) 650 MG CR tablet Take 1,300 mg by mouth every 8 (eight) hours.   unknown at prn  . albuterol (VENTOLIN HFA) 108 (90 Base) MCG/ACT inhaler Inhale 2 puffs into the lungs every 6 (six) hours as needed for wheezing or shortness of breath. 18 g 1 unknown at prn  . atorvastatin (LIPITOR) 40 MG tablet Take 1 tablet (40 mg total) by mouth daily. 90 tablet 1 03/31/2020 at 2000  . pravastatin (PRAVACHOL) 40 MG tablet TAKE 1 TABLET BY MOUTH AT BEDTIME (Patient taking differently: Take 40 mg by mouth at bedtime. ) 90 tablet 0 03/31/2020 at 2000   Social History   Socioeconomic History  . Marital status: Widowed    Spouse name: Not on file  . Number of children: Not on file  . Years of education: Not on file  . Highest education level: Not on file  Occupational History  . Not on file  Tobacco Use  . Smoking status: Current Every Day Smoker    Packs/day: 1.00    Years: 62.00    Pack years: 62.00    Types: Cigarettes  . Smokeless tobacco: Never Used  Vaping Use  . Vaping Use: Never used  Substance and Sexual Activity  . Alcohol use: Not Currently  . Drug use: Never  . Sexual activity: Not Currently  Other Topics Concern  . Not on file  Social History Narrative  . Not on file   Social Determinants of Health   Financial Resource Strain:   . Difficulty of Paying Living Expenses: Not on file  Food Insecurity:   . Worried About Programme researcher, broadcasting/film/video in the Last Year: Not on file  . Ran Out of Food in the Last Year: Not on file  Transportation Needs:   . Lack of Transportation (Medical): Not on file  . Lack of Transportation (Non-Medical): Not on file  Physical Activity:   . Days of Exercise per Week: Not on file  . Minutes of Exercise per Session: Not on file  Stress:   . Feeling of Stress : Not on file  Social Connections:   .  Frequency of Communication with Friends and Family: Not on file  . Frequency of Social Gatherings with Friends and Family: Not on file  . Attends Religious Services: Not on file  . Active Member of Clubs or Organizations: Not on file  . Attends Banker Meetings: Not on file  . Marital Status: Not on file  Intimate Partner Violence:   . Fear of Current or Ex-Partner: Not on file  . Emotionally Abused: Not on file  . Physically Abused: Not on file  . Sexually Abused:  Not on file    Family History  Problem Relation Age of Onset  . Heart attack Father 46  . Brain cancer Sister   . Lung cancer Sister   . Breast cancer Neg Hx   . Colon cancer Neg Hx       Review of systems complete and found to be negative unless listed above      PHYSICAL EXAM  General: Well developed, well nourished, in no acute distress HEENT:  Normocephalic and atramatic Neck:  No JVD.  Lungs: Clear bilaterally to auscultation and percussion. Heart: HRRR . Normal S1 and S2 without gallops or murmurs.  Abdomen: Bowel sounds are positive, abdomen soft and non-tender  Msk:  Back normal, normal gait. Normal strength and tone for age. Extremities: No clubbing, cyanosis or edema.   Neuro: Alert and oriented X 3. Psych:  Good affect, responds appropriately  Labs:   Lab Results  Component Value Date   WBC 4.6 04/02/2020   HGB 10.8 (L) 04/02/2020   HCT 33.7 (L) 04/02/2020   MCV 95.5 04/02/2020   PLT 247 04/02/2020    Recent Labs  Lab 04/02/20 0056  NA 136  K 3.7  CL 103  CO2 21*  BUN 19  CREATININE 0.99  CALCIUM 8.6*  PROT 7.0  BILITOT 0.5  ALKPHOS 86  ALT 13  AST 24  GLUCOSE 192*   Lab Results  Component Value Date   TROPONINI <0.03 07/29/2015    Lab Results  Component Value Date   CHOL 168 02/12/2020   CHOL 150 08/20/2019   Lab Results  Component Value Date   HDL 62 02/12/2020   HDL 53 08/20/2019   Lab Results  Component Value Date   LDLCALC 96 02/12/2020    LDLCALC 84 08/20/2019   Lab Results  Component Value Date   TRIG 46 02/12/2020   TRIG 62 08/20/2019   Lab Results  Component Value Date   CHOLHDL 2.7 02/12/2020   CHOLHDL 2.8 08/20/2019   No results found for: LDLDIRECT    Radiology: DG Chest Portable 1 View  Result Date: 04/02/2020 CLINICAL DATA:  Dyspnea EXAM: PORTABLE CHEST 1 VIEW COMPARISON:  01/05/2019 FINDINGS: The lungs are stable leak, symmetrically mildly hyperinflated in keeping with changes of underlying COPD. Interstitial prominence likely relates to underlying interstitial lung disease. No pneumothorax or pleural effusion. Cardiac size within normal limits. No acute bone abnormality. IMPRESSION: No active disease.  COPD. Electronically Signed   By: Helyn Numbers MD   On: 04/02/2020 01:20   ECHOCARDIOGRAM COMPLETE  Result Date: 04/02/2020    ECHOCARDIOGRAM REPORT   Patient Name:   [facility] Date of Exam: 04/02/2020 Medical Rec #:  [id number]            Height:       66.0 in Accession #:    [id number]           Weight:       165.3 lb Date of Birth:  [birth year]           BSA:          1.844 m Patient Age:    83 years             BP:           167/83 mmHg Patient Gender: F                    HR:  91 bpm. Exam Location:  [facility] Procedure: 2D Echo, Cardiac Doppler and Color Doppler Indications:     Cardiiomyopathy-unspecified 425.9  History:         Patient has prior history of Echocardiogram examinations, most                  recent 02/20/2018. CAD, COPD, Arrythmias:Atrial Fibrillation;                  Risk Factors:Hypertension.  Sonographer:     Cristela Blue RDCS (AE) Referring Phys:  [id number] BRITTON L RUST-CHESTER Diagnosing Phys: Alwyn Pea MD IMPRESSIONS  1. Left ventricular ejection fraction, by estimation, is 60 to 65%. The left ventricle has normal function. The left ventricle has no regional wall motion abnormalities. Left ventricular diastolic parameters were normal.  2. Right ventricular systolic  function is normal. The right ventricular size is normal.  3. The mitral valve is normal in structure. No evidence of mitral valve regurgitation.  4. The aortic valve is normal in structure. Aortic valve regurgitation is not visualized. FINDINGS  Left Ventricle: Left ventricular ejection fraction, by estimation, is 60 to 65%. The left ventricle has normal function. The left ventricle has no regional wall motion abnormalities. The left ventricular internal cavity size was normal in size. There is  borderline left ventricular hypertrophy. Left ventricular diastolic parameters were normal. Right Ventricle: The right ventricular size is normal. No increase in right ventricular wall thickness. Right ventricular systolic function is normal. Left Atrium: Left atrial size was normal in size. Right Atrium: Right atrial size was normal in size. Pericardium: There is no evidence of pericardial effusion. Mitral Valve: The mitral valve is normal in structure. No evidence of mitral valve regurgitation. Tricuspid Valve: The tricuspid valve is normal in structure. Tricuspid valve regurgitation is not demonstrated. Aortic Valve: The aortic valve is normal in structure. Aortic valve regurgitation is not visualized. Aortic valve mean gradient measures 6.5 mmHg. Aortic valve peak gradient measures 10.5 mmHg. Aortic valve area, by VTI measures 2.66 cm. Pulmonic Valve: The pulmonic valve was normal in structure. Pulmonic valve regurgitation is not visualized. Aorta: The aortic arch was not well visualized. IAS/Shunts: No atrial level shunt detected by color flow Doppler.  LEFT VENTRICLE PLAX 2D LVIDd:         4.83 cm     Diastology LVIDs:         3.44 cm     LV e' medial:    7.83 cm/s LV PW:         1.30 cm     LV E/e' medial:  13.2 LV IVS:        1.45 cm     LV e' lateral:   5.22 cm/s LVOT diam:     2.00 cm     LV E/e' lateral: 19.7 LV SV:         74 LV SV Index:   40 LVOT Area:     3.14 cm  LV Volumes (MOD) LV vol d, MOD A2C: 76.9 ml  LV vol d, MOD A4C: 88.7 ml LV vol s, MOD A2C: 48.0 ml LV vol s, MOD A4C: 41.7 ml LV SV MOD A2C:     28.9 ml LV SV MOD A4C:     88.7 ml LV SV MOD BP:      40.8 ml RIGHT VENTRICLE RV Basal diam:  3.13 cm RV S prime:     14.10 cm/s TAPSE (M-mode): 3.8 cm LEFT ATRIUM  Index       RIGHT ATRIUM           Index LA diam:        3.50 cm 1.90 cm/m  RA Area:     15.60 cm LA Vol (A2C):   81.6 ml 44.24 ml/m RA Volume:   38.90 ml  21.09 ml/m LA Vol (A4C):   61.0 ml 33.07 ml/m LA Biplane Vol: 74.4 ml 40.34 ml/m  AORTIC VALVE                    PULMONIC VALVE AV Area (Vmax):    2.37 cm     PV Vmax:        0.72 m/s AV Area (Vmean):   2.31 cm     PV Peak grad:   2.1 mmHg AV Area (VTI):     2.66 cm     RVOT Peak grad: 8 mmHg AV Vmax:           162.00 cm/s AV Vmean:          115.000 cm/s AV VTI:            0.279 m AV Peak Grad:      10.5 mmHg AV Mean Grad:      6.5 mmHg LVOT Vmax:         122.00 cm/s LVOT Vmean:        84.400 cm/s LVOT VTI:          0.236 m LVOT/AV VTI ratio: 0.85  AORTA Ao Root diam: 3.00 cm MITRAL VALVE                TRICUSPID VALVE MV Area (PHT): 5.27 cm     TR Peak grad:   23.8 mmHg MV Decel Time: 144 msec     TR Vmax:        244.00 cm/s MV E velocity: 103.00 cm/s MV A velocity: 98.50 cm/s   SHUNTS MV E/A ratio:  1.05         Systemic VTI:  0.24 m                             Systemic Diam: 2.00 cm Berneice Zettlemoyer D Mattison Golay MD Electronically signed by Alwyn Peawayne D Maeryn Mcgath MD Signature Date/Time: 04/02/2020/1:41:34 PM    Final     EKG: Normal sinus rhythm left bundle branch block  ASSESSMENT AND PLAN:  Unstable angina Known coronary artery disease Atrial fibrillation Hypertension Hyperlipidemia Smoking AAA repair Claudication COPD Acute on chronic respiratory failure . Plan Agree with admit rule out myocardial Follow-up EKGs troponins Agree with respiratory support including steroid therapy inhalers Recommend echocardiogram for assessment of left ventricular function Continue  hypertension management and control Advised patient refrain from tobacco abuse Recommend invasive strategy including cardiac cath prior to discharge  Signed: Alwyn Peawayne D Dionicia Cerritos MD 04/02/2020, 2:16 PM

## 2020-04-02 NOTE — Progress Notes (Signed)
Accepted from PCCM.  Patient admitted for COPD exacerbation and elevated troponin due to non-STEMI versus demand ischemia.  Cardiology planning cath tomorrow.  TRH will take over as an attending on 04/03/2020 at 7 AM

## 2020-04-02 NOTE — ED Triage Notes (Signed)
Pt to ED via EMS from home, per ems pt went to neighbors for help c/o sob. Pt has hx of copd, chf. Per ems room air sats 40s. Pt arrives on cpap 89%. Pt was given 5 albuterol, 2g mag, 125 solumedrol, 1 nitor, 4 zofran.

## 2020-04-02 NOTE — ED Notes (Signed)
Date and time results received: 04/02/20 2:03 AM  Test: Mag Critical Value: 12  Name of Provider Notified: Katrinka Blazing, MD   Orders Received? Or Actions Taken?: Actions Taken: Katrinka Blazing, MD aware

## 2020-04-02 NOTE — Progress Notes (Signed)
ANTICOAGULATION CONSULT NOTE - Initial Consult  Pharmacy Consult for Heparin  Indication: chest pain/ACS  Allergies  Allergen Reactions  . Iodinated Diagnostic Agents Hives, Itching and Rash    She states she has to be premedicated. SPM  . Sulfa Antibiotics Rash    Patient Measurements: Height: 5\' 6"  (167.6 cm) Weight: 75 kg (165 lb 5.5 oz) IBW/kg (Calculated) : 59.3 Heparin Dosing Weight: 74.4 kg   Vital Signs: Temp: 97.5 F (36.4 C) (12/02 0400) Temp Source: Axillary (12/02 0400) BP: 167/83 (12/02 0600) Pulse Rate: 91 (12/02 0700)  Labs: Recent Labs    04/02/20 0056 04/02/20 0322  HGB 10.8*  --   HCT 33.7*  --   PLT 247  --   APTT 26  --   LABPROT 15.3*  --   INR 1.3*  --   CREATININE 0.99  --   TROPONINIHS 101* 458*    Estimated Creatinine Clearance: 46.2 mL/min (by C-G formula based on SCr of 0.99 mg/dL).   Medical History: Past Medical History:  Diagnosis Date  . AAA (abdominal aortic aneurysm) (HCC)   . Allergy   . Anemia   . Atrial fibrillation (HCC)   . Blood transfusion without reported diagnosis   . CAD (coronary artery disease)    s/p PCI to LAD & RCA  . COPD (chronic obstructive pulmonary disease) (HCC)   . Depression   . Emphysema of lung (HCC)   . GERD (gastroesophageal reflux disease)   . History of blood clots   . Hyperlipidemia   . Hypertension   . Personal history of tobacco use, presenting hazards to health 07/01/2015  . Renal disorder   . Thyroid disease     Medications:  Medications Prior to Admission  Medication Sig Dispense Refill Last Dose  . acetaminophen (ACETAMINOPHEN 8 HOUR) 650 MG CR tablet Take 1,300 mg by mouth every 8 (eight) hours.     08/31/2015 albuterol (VENTOLIN HFA) 108 (90 Base) MCG/ACT inhaler Inhale 2 puffs into the lungs every 6 (six) hours as needed for wheezing or shortness of breath. 18 g 1   . aspirin EC 81 MG tablet Take 81 mg by mouth daily.     Marland Kitchen atorvastatin (LIPITOR) 40 MG tablet Take 1 tablet (40 mg total)  by mouth daily. 90 tablet 1   . carvedilol (COREG) 25 MG tablet Take 1 tablet (25 mg total) by mouth 2 (two) times daily with a meal. 180 tablet 1   . cloNIDine (CATAPRES) 0.2 MG tablet Take 1 tablet (0.2 mg total) by mouth 2 (two) times daily. 180 tablet 1   . ferrous sulfate 325 (65 FE) MG tablet Take 1 tablet (325 mg total) by mouth daily with breakfast. 60 tablet 3   . fluticasone (FLONASE) 50 MCG/ACT nasal spray Place into the nose.     . gabapentin (NEURONTIN) 600 MG tablet Take 1 tablet (600 mg total) by mouth 2 (two) times daily. 180 tablet 1   . gemfibrozil (LOPID) 600 MG tablet TAKE 1 TABLET BY MOUTH TWICE DAILY BEFORE A MEAL 180 tablet 3   . levothyroxine (SYNTHROID) 112 MCG tablet Take 1 tablet (112 mcg total) by mouth daily. 90 tablet 1   . lisinopril (ZESTRIL) 40 MG tablet Take 1 tablet (40 mg total) by mouth daily. 90 tablet 0   . Multiple Vitamins-Minerals (MULTIVITAMIN GUMMIES ADULT PO) Take 1 each by mouth daily.     . nitroGLYCERIN (NITROSTAT) 0.4 MG SL tablet Place 0.4 mg under the tongue every  5 (five) minutes as needed for chest pain.     Marland Kitchen omeprazole (PRILOSEC) 20 MG capsule Take 1 capsule by mouth once daily 90 capsule 0   . pravastatin (PRAVACHOL) 40 MG tablet TAKE 1 TABLET BY MOUTH AT BEDTIME 90 tablet 0   . Venlafaxine HCl 225 MG TB24 Take 1 tablet by mouth once daily 90 tablet 1     Assessment: Pharmacy consulted to dose heparin in this 84 year old female admitted with ACS/NSTEMI.  No prior anticoag. CrCl = 46.2 ml/min   Goal of Therapy:  Heparin level 0.3-0.7 units/ml Monitor platelets by anticoagulation protocol: Yes   Plan:  Give 4000 units bolus x 1 Start heparin infusion at 900 units/hr Check anti-Xa level in 8 hours and daily while on heparin Continue to monitor H&H and platelets  Kyllian Clingerman D 04/02/2020,7:08 AM

## 2020-04-02 NOTE — Plan of Care (Signed)
°  Problem: Activity: °Goal: Ability to return to baseline activity level will improve °Outcome: Progressing °  °Problem: Cardiovascular: °Goal: Ability to achieve and maintain adequate cardiovascular perfusion will improve °Outcome: Progressing °  °

## 2020-04-02 NOTE — H&P (Signed)
NAME:  Eileen Mack, MRN:  [id number], DOB:  Jun 27, 1935, LOS: 0 ADMISSION DATE:  04/02/2020, CONSULTATION DATE:  04/02/2020 REFERRING MD:  Dr. Antoine Primas, CHIEF COMPLAINT:  dyspnea   Brief History   84 yo F from home via EMS presenting with Acute Hypoxic respiratory failure requiring BIPAP admitted to [facility].  History of present illness   84 yo female presented to the ED from home via EMS with dyspnea that started after she "lay down to sleep" around 23:00 on 04/01/20.  She felt short of breath, became anxious and called EMS.  EMS reported on arrival to the ED that the patient was discovered confused, wheezing with an SpO2 in the 40's on room air and placed on CPAP. EMS administered solu-medrol, duo-nebs and magnesium, with marked improvement in her mental status prior to arrival at the ED.  As a separate incident she also reports experiencing chest pain the day after Thanksgiving.  She reported taking SL nitro x 3 doses and the pain "went away".    Patient placed on BIPAP upon arrival to ED at 60%. ED labs significant for: VBG- pH 7.25/ PCO2 56 and bicarb 24.6, lactic 3, PCT <0.10, troponin 101, BNP 394. PCCM consulted for further workup and monitoring. Past Medical History  Thyroid disease HTN HLD GERD COPD Depression Atrial Fibrillation s/p ablation Anemia CAD s/p PCI to LAD & RAD AAA OSA  Significant Hospital Events   04/02/20 > Admit to SDU on BIPAP @ 60%  Consults:    Procedures:    Significant Diagnostic Tests:    Micro Data:  04/02/20 COVID-19 >> negative 04/02/20 Influenza A/B >> negative 04/02/20 Blood cultures x 2 >> 04/02/20 Urine Culture >> 04/02/20 Sputum culture >>  Antimicrobials:  04/02/20 Ceftriaxone x 1 04/02/20 Azithromycin x 1  Interim history/subjective:  Patient lying in bed, appearing comfortable, NAD able to converse without difficulty. She reports last taking her medications on 12/1 in the morning, but did not take any of her evening  BP/other regularly scheduled meds. No current complaints of pain, nausea, dyspnea - says she feels much improved on the BIPAP.  Labs/ Imaging personally reviewed Na+/ K+: 137/ 3.7 BUN/Cr.: 19/ 0.99 Hgb: 10.8 Mag: 12 >> REPEAT 1.7  BNP: 394 Troponin: 101 >> 498 WBC/ TMAX: 4.6/ afebrile Lactic/ PCT: 3.0/ <0.10  VBG: 7.25/ 56/ 42/ 24.6 CXR 04/02/20: mild hyperinflation of the lungs, chronic interstitial prominence consistent with COPD. No active disease.  Objective   Blood pressure (!) 148/114, pulse (!) 101, resp. rate (!) 24, height 5\' 7"  (1.702 m), weight 77.9 kg, SpO2 100 %.       No intake or output data in the 24 hours ending 04/02/20 0314 Filed Weights   04/02/20 0050  Weight: 77.9 kg    Examination: General: Adult female, critically ill, lying in bed on BIPAP, NAD HEENT: MM pink/moist, anicteric, atraumatic, neck supple Neuro: A&O x 4, follows commands, PERRL +3, MAE CV: s1s2 RRR, NSR 1 st HB with known LBBB on monitor, no r/m/g Pulm: Regular, non labored on BIPAP @ 60%, breath sounds diminished auscultated throughout GI: soft, rounded, non tender, bs x 4 GU: purewick in place- UTA Skin: scattered ecchymosis, no rashes/lesions noted Extremities: warm/dry, pulses + 2 R/P, no edema noted  Resolved Hospital Problem list     Assessment & Plan:  Acute Hypoxic Respiratory Failure due to suspected COPD exacerbation  PMHx: COPD, OSA, current smoker (1 pack daily) EMS reported finding the patient with SpO2 in 40's on  room air. Patient describes orthopnea on 04/01/20 as she was lying down to sleep. Patient does not wear oxygen chronically and does report some mild congestion and clear sputum recently. She denies fever, chills, nausea/diarrhea out of the ordinary. PCT: < 0.10 No active disease on CXR- will hold off on further antibiotics at this time. - Supplemental O2 to maintain SpO2 > 90% - BIPAP PRN - Intermittent CXR & ABG as needed - Budesonide neb BID - Ipratropium-  albuterol neb Q 6 h - Solu-medrol 40 mg BID - f/u cultures - trend lactic/ PCT - daily CBC - monitor WBC/ fever curve  Elevated Troponin due to demand ischemia vs N-STEMI PMHx: CAD Patient describes experiencing chest pain the day after Thanksgiving, but that it "went away" after 3 doses of nitro SL.  Troponin: 101~ 458, no complaints of chest pain. BNP elevated: 394. EKG: 1st degree HB & known LBBB, questionable increasing ST elevation in V2/ V3. Case Discussed with Dr. Dellie CatholicSommers @ [facility] initiating heparin drip & cardiology consulted - Heparin drip initiated per pharmacy consult - trend troponin until peak - continuous cardiac monitoring - Echocardiogram ordered - Cardiology consulted, appreciate input - ASA 81 mg, continue home BB  HTN Patient missed evening doses of BP medication. Labetalol x 1 ordered for  BP > 160 - Continue home medication: carvedilol & clonidine  GERD - Protonix 40 mg daily  Hypothyroidism - continue home regimen: synthroid daily  Hyperlipidemia - continue home regimen: atorvastatin & gemfibrozil  Best practice (evaluated daily)  Diet: NPO while on BIPAP Pain/Anxiety/Delirium protocol (if indicated): N/A VAP protocol (if indicated): N/A DVT prophylaxis: enoxaparin GI prophylaxis: N/A Glucose control: monitor Q 4, target range 140-180 Mobility: Bedrest (while on BiPAP), mobilize as tolerated last date of multidisciplinary goals of care discussion: 04/02/20 Family and staff present: patient, NP & RN Summary of discussion: patient confirmed DNR/DNI status Follow up goals of care discussion due: N/A Code Status: DNR/DNI Disposition: Stepdown  Labs   CBC: Recent Labs  Lab 04/02/20 0056  WBC 4.6  NEUTROABS 2.5  HGB 10.8*  HCT 33.7*  MCV 95.5  PLT 247    Basic Metabolic Panel: Recent Labs  Lab 04/02/20 0056  NA 136  K 3.7  CL 103  CO2 21*  GLUCOSE 192*  BUN 19  CREATININE 0.99  CALCIUM 8.6*  MG 12.0*   GFR: Estimated Creatinine  Clearance: 47.9 mL/min (by C-G formula based on SCr of 0.99 mg/dL). Recent Labs  Lab 04/02/20 0056  PROCALCITON <0.10  WBC 4.6  LATICACIDVEN 3.0*    Liver Function Tests: Recent Labs  Lab 04/02/20 0056  AST 24  ALT 13  ALKPHOS 86  BILITOT 0.5  PROT 7.0  ALBUMIN 3.5   No results for input(s): LIPASE, AMYLASE in the last 168 hours. No results for input(s): AMMONIA in the last 168 hours.  ABG    Component Value Date/Time   HCO3 24.6 04/02/2020 0056   ACIDBASEDEF 3.2 (H) 04/02/2020 0056   O2SAT 68.1 04/02/2020 0056     Coagulation Profile: Recent Labs  Lab 04/02/20 0056  INR 1.3*    Cardiac Enzymes: No results for input(s): CKTOTAL, CKMB, CKMBINDEX, TROPONINI in the last 168 hours.  HbA1C: Hgb A1c MFr Bld  Date/Time Value Ref Range Status  02/12/2020 09:38 AM 6.4 (H) 4.8 - 5.6 % Final    Comment:             Prediabetes: 5.7 - 6.4  Diabetes: >6.4          Glycemic control for adults with diabetes: <7.0   08/20/2019 11:15 AM 5.9 (H) 4.8 - 5.6 % Final    Comment:             Prediabetes: 5.7 - 6.4          Diabetes: >6.4          Glycemic control for adults with diabetes: <7.0     CBG: No results for input(s): GLUCAP in the last 168 hours.  Review of Systems: Positives in BOLD  Gen: Denies fever, chills, weight change, fatigue, night sweats HEENT: Denies blurred vision, double vision, hearing loss, tinnitus, sinus congestion, rhinorrhea, sore throat, neck stiffness, dysphagia PULM: Denies shortness of breath, cough, sputum production, hemoptysis, wheezing CV: Denies chest pain, edema, orthopnea, paroxysmal nocturnal dyspnea, palpitations GI: Denies abdominal pain, nausea, vomiting, diarrhea, hematochezia, melena, constipation, change in bowel habits GU: Denies dysuria, hematuria, polyuria, oliguria, urethral discharge Endocrine: Denies hot or cold intolerance, polyuria, polyphagia or appetite change Derm: Denies rash, dry skin, scaling or peeling  skin change Heme: Denies easy bruising, bleeding, bleeding gums Neuro: Denies headache, numbness, weakness, slurred speech, loss of memory or consciousness  Past Medical History  She,  has a past medical history of Allergy, Anemia, Atrial fibrillation (HCC), Blood transfusion without reported diagnosis, COPD (chronic obstructive pulmonary disease) (HCC), Depression, Emphysema of lung (HCC), GERD (gastroesophageal reflux disease), History of blood clots, Hyperlipidemia, Hypertension, Personal history of tobacco use, presenting hazards to health (07/01/2015), Renal disorder, and Thyroid disease.   Surgical History    Past Surgical History:  Procedure Laterality Date  . ABDOMINAL HYSTERECTOMY    . APPENDECTOMY    . CARDIAC SURGERY    . CHOLECYSTECTOMY    . COLONOSCOPY WITH PROPOFOL Bilateral 08/01/2015   Procedure: COLONOSCOPY WITH PROPOFOL;  Surgeon: Scot Jun, MD;  Location: [facility] ENDOSCOPY;  Service: Endoscopy;  Laterality: Bilateral;  . ENDOVASCULAR REPAIR/STENT GRAFT N/A 02/21/2018   Procedure: ENDOVASCULAR REPAIR/STENT GRAFT;  Surgeon: Renford Dills, MD;  Location: [facility] INVASIVE CV LAB;  Service: Cardiovascular;  Laterality: N/A;  . ESOPHAGOGASTRODUODENOSCOPY N/A 07/31/2015   Procedure: ESOPHAGOGASTRODUODENOSCOPY (EGD);  Surgeon: Scot Jun, MD;  Location: [facility] ENDOSCOPY;  Service: Endoscopy;  Laterality: N/A;  . EYE SURGERY       Social History   reports that she has been smoking cigarettes. She has a 62.00 pack-year smoking history. She has never used smokeless tobacco. She reports previous alcohol use. She reports that she does not use drugs.   Family History   Her family history includes Brain cancer in her sister; Heart attack (age of onset: 28) in her father; Lung cancer in her sister. There is no history of Breast cancer or Colon cancer.   Allergies Allergies  Allergen Reactions  . Iodinated Diagnostic Agents Hives, Itching and Rash    She states she has to be  premedicated. SPM  . Sulfa Antibiotics Rash     Home Medications  Prior to Admission medications   Medication Sig Start Date End Date Taking? Authorizing Provider  acetaminophen (ACETAMINOPHEN 8 HOUR) 650 MG CR tablet Take 1,300 mg by mouth every 8 (eight) hours.    [provider]  albuterol (VENTOLIN HFA) 108 (90 Base) MCG/ACT inhaler Inhale 2 puffs into the lungs every 6 (six) hours as needed for wheezing or shortness of breath. 01/06/19   Altamese Dilling, MD  aspirin EC 81 MG tablet Take 81 mg by mouth daily.  [provider]  atorvastatin (LIPITOR) 40 MG tablet Take 1 tablet (40 mg total) by mouth daily. 02/13/20   Bacigalupo, Marzella Schlein, MD  carvedilol (COREG) 25 MG tablet Take 1 tablet (25 mg total) by mouth 2 (two) times daily with a meal. 10/02/19   Bacigalupo, Marzella Schlein, MD  cloNIDine (CATAPRES) 0.2 MG tablet Take 1 tablet (0.2 mg total) by mouth 2 (two) times daily. 10/02/19   Erasmo Downer, MD  ferrous sulfate 325 (65 FE) MG tablet Take 1 tablet (325 mg total) by mouth daily with breakfast. 08/01/15   Adrian Saran, MD  fluticasone (FLONASE) 50 MCG/ACT nasal spray Place into the nose. 08/15/19   [provider]  gabapentin (NEURONTIN) 600 MG tablet Take 1 tablet (600 mg total) by mouth 2 (two) times daily. 01/30/20   Erasmo Downer, MD  gemfibrozil (LOPID) 600 MG tablet TAKE 1 TABLET BY MOUTH TWICE DAILY BEFORE A MEAL 01/11/20   Bacigalupo, Marzella Schlein, MD  levothyroxine (SYNTHROID) 112 MCG tablet Take 1 tablet (112 mcg total) by mouth daily. 02/13/20   Bacigalupo, Marzella Schlein, MD  lisinopril (ZESTRIL) 40 MG tablet Take 1 tablet (40 mg total) by mouth daily. 01/30/20   Erasmo Downer, MD  Multiple Vitamins-Minerals (MULTIVITAMIN GUMMIES ADULT PO) Take 1 each by mouth daily.    [provider]  nitroGLYCERIN (NITROSTAT) 0.4 MG SL tablet Place 0.4 mg under the tongue every 5 (five) minutes as needed for chest pain.    [provider]   omeprazole (PRILOSEC) 20 MG capsule Take 1 capsule by mouth once daily 02/17/20   Erasmo Downer, MD  pravastatin (PRAVACHOL) 40 MG tablet TAKE 1 TABLET BY MOUTH AT BEDTIME 12/16/19   Erasmo Downer, MD  Venlafaxine HCl 225 MG TB24 Take 1 tablet by mouth once daily 03/12/20   Erasmo Downer, MD     Critical care time: 45 minutes       Betsey Holiday, AGACNP-BC Acute Care Nurse Practitioner [facility]   [phone] / [phone] Please see Amion for pager details.

## 2020-04-02 NOTE — Progress Notes (Signed)
*  PRELIMINARY RESULTS* Echocardiogram 2D Echocardiogram has been performed.  Cristela Blue 04/02/2020, 10:38 AM

## 2020-04-02 NOTE — Progress Notes (Signed)
ANTICOAGULATION CONSULT NOTE - Initial Consult  Pharmacy Consult for Heparin  Indication: chest pain/ACS  Allergies  Allergen Reactions  . Iodinated Diagnostic Agents Hives, Itching and Rash    She states she has to be premedicated. SPM  . Sulfa Antibiotics Rash    Patient Measurements: Height: 5\' 6"  (167.6 cm) Weight: 75 kg (165 lb 5.5 oz) IBW/kg (Calculated) : 59.3 Heparin Dosing Weight: 74.4 kg   Vital Signs: Temp: 98.2 F (36.8 C) (12/02 1200) Temp Source: Oral (12/02 1200) BP: 154/75 (12/02 1600) Pulse Rate: 113 (12/02 1600)  Labs: Recent Labs    04/02/20 0056 04/02/20 0056 04/02/20 0322 04/02/20 0641 04/02/20 0907 04/02/20 1608  HGB 10.8*  --   --   --   --   --   HCT 33.7*  --   --   --   --   --   PLT 247  --   --   --   --   --   APTT 26  --   --   --   --   --   LABPROT 15.3*  --   --   --   --   --   INR 1.3*  --   --   --   --   --   HEPARINUNFRC  --   --   --   --   --  0.80*  CREATININE 0.99  --   --   --   --   --   TROPONINIHS 101*   < > 458* 529* 525*  --    < > = values in this interval not displayed.    Estimated Creatinine Clearance: 46.2 mL/min (by C-G formula based on SCr of 0.99 mg/dL).   Medical History: Past Medical History:  Diagnosis Date  . AAA (abdominal aortic aneurysm) (HCC)   . Allergy   . Anemia   . Atrial fibrillation (HCC)   . Blood transfusion without reported diagnosis   . CAD (coronary artery disease)    s/p PCI to LAD & RCA  . COPD (chronic obstructive pulmonary disease) (HCC)   . Depression   . Emphysema of lung (HCC)   . GERD (gastroesophageal reflux disease)   . History of blood clots   . Hyperlipidemia   . Hypertension   . Personal history of tobacco use, presenting hazards to health 07/01/2015  . Renal disorder   . Thyroid disease     Medications:  Medications Prior to Admission  Medication Sig Dispense Refill Last Dose  . aspirin EC 81 MG tablet Take 81 mg by mouth daily.   04/01/2020 at 0800  .  carvedilol (COREG) 25 MG tablet Take 1 tablet (25 mg total) by mouth 2 (two) times daily with a meal. 180 tablet 1 04/01/2020 at 0800  . cloNIDine (CATAPRES) 0.2 MG tablet Take 1 tablet (0.2 mg total) by mouth 2 (two) times daily. 180 tablet 1 04/01/2020 at 0800  . ferrous sulfate 325 (65 FE) MG tablet Take 1 tablet (325 mg total) by mouth daily with breakfast. 60 tablet 3 04/01/2020 at 0800  . fluticasone (FLONASE) 50 MCG/ACT nasal spray Place into the nose.   Past Week at Unknown time  . gabapentin (NEURONTIN) 600 MG tablet Take 1 tablet (600 mg total) by mouth 2 (two) times daily. 180 tablet 1 04/01/2020 at 0800  . gemfibrozil (LOPID) 600 MG tablet TAKE 1 TABLET BY MOUTH TWICE DAILY BEFORE A MEAL (Patient taking differently: Take 600 mg  by mouth 2 (two) times daily before a meal. ) 180 tablet 3 04/01/2020 at 0800  . levothyroxine (SYNTHROID) 112 MCG tablet Take 1 tablet (112 mcg total) by mouth daily. 90 tablet 1 04/01/2020 at 0730  . lisinopril (ZESTRIL) 40 MG tablet Take 1 tablet (40 mg total) by mouth daily. 90 tablet 0 04/01/2020 at 0800  . Multiple Vitamins-Minerals (MULTIVITAMIN GUMMIES ADULT PO) Take 1 each by mouth daily.   Past Week at Unknown time  . nitroGLYCERIN (NITROSTAT) 0.4 MG SL tablet Place 0.4 mg under the tongue every 5 (five) minutes as needed for chest pain.   Past Week at prn  . omeprazole (PRILOSEC) 20 MG capsule Take 1 capsule by mouth once daily (Patient taking differently: Take 20 mg by mouth daily. ) 90 capsule 0 04/01/2020 at 0800  . Venlafaxine HCl 225 MG TB24 Take 1 tablet by mouth once daily (Patient taking differently: Take 225 mg by mouth daily. ) 90 tablet 1 04/01/2020 at 0800  . acetaminophen (ACETAMINOPHEN 8 HOUR) 650 MG CR tablet Take 1,300 mg by mouth every 8 (eight) hours.   unknown at prn  . albuterol (VENTOLIN HFA) 108 (90 Base) MCG/ACT inhaler Inhale 2 puffs into the lungs every 6 (six) hours as needed for wheezing or shortness of breath. 18 g 1 unknown at prn  .  atorvastatin (LIPITOR) 40 MG tablet Take 1 tablet (40 mg total) by mouth daily. 90 tablet 1 03/31/2020 at 2000  . pravastatin (PRAVACHOL) 40 MG tablet TAKE 1 TABLET BY MOUTH AT BEDTIME (Patient taking differently: Take 40 mg by mouth at bedtime. ) 90 tablet 0 03/31/2020 at 2000    Assessment: Pharmacy consulted to dose heparin in this 84 year old female admitted with ACS/NSTEMI.  No prior anticoag. CrCl = 46.2 ml/min   Date HL Rate 12/2 -- 4000unit x1; then 900 unit/hr  12/2 0.8    Goal of Therapy:  Heparin level 0.3-0.7 units/ml Monitor platelets by anticoagulation protocol: Yes   Plan:  8hr post-start HL was 0.8, will decrease 100 units/hr (~1-2 units/kg/hr). New rate of 800 units/hr Check anti-Xa level in 8 hours and daily while on heparin Continue to monitor H&H and platelets Martyn Malay 04/02/2020,5:04 PM

## 2020-04-02 NOTE — ED Notes (Signed)
Date and time results received: 04/02/20 0147   Test: Troponin Critical Value: 101  Name of Provider Notified: Katrinka Blazing, MD   Orders Received? Or Actions Taken? Katrinka Blazing, MD aware

## 2020-04-02 NOTE — ED Provider Notes (Signed)
[facility] Emergency Department Provider Note  ____________________________________________   First MD Initiated Contact with Patient 04/02/20 [phone]     (approximate)  I have reviewed the triage vital signs and the nursing notes.   HISTORY  Chief Complaint Shortness of Breath   HPI Eileen Mack is a 84 y.o. female with a past medical history of COPD, A. fib, HTN, HDL, hypothyroidism, HDL, HTN, anemia, diastolic heart failure, and tobacco abuse who presents via EMS from home for assessment of shortness of breath that began last night.  Patient states her shortness of breath began earlier this evening.  She denies any significant change in cough from baseline.  She denies any acute pain including headache, chest pain, Donnell pain, back pain or extremity pain.  Denies any recent falls or injuries.  She denies any nausea, vomiting, diarrhea, dysuria, rash, or other acute complaints.  Per EMS patient was hypoxic on room air on arrival and she was immediately placed on CPAP.  She did have some wheezing and received 125 mg of Solu-Medrol as well as duo nebs and magnesium.  Patient was also reportedly confused with EMS although per EMS she had significant provement mental status perspective.         Past Medical History:  Diagnosis Date  . Allergy   . Anemia   . Atrial fibrillation (HCC)   . Blood transfusion without reported diagnosis   . COPD (chronic obstructive pulmonary disease) (HCC)   . Depression   . Emphysema of lung (HCC)   . GERD (gastroesophageal reflux disease)   . History of blood clots   . Hyperlipidemia   . Hypertension   . Personal history of tobacco use, presenting hazards to health 07/01/2015  . Renal disorder   . Thyroid disease     Patient Active Problem List   Diagnosis Date Noted  . Acute respiratory failure (HCC) 04/02/2020  . OA (osteoarthritis) 09/19/2019  . History of recurrent ear infection 09/19/2019  . Prediabetes  07/31/2019  . Chronic right shoulder pain 02/26/2019  . Senile purpura (HCC) 02/26/2019  . Breast pain, left 02/26/2019  . Renal mass 02/26/2019  . COPD (chronic obstructive pulmonary disease) (HCC) 01/06/2019  . AAA (abdominal aortic aneurysm) without rupture (HCC) 10/10/2017  . Varicose veins of both lower extremities with pain 10/10/2017  . Claudication (HCC) 10/10/2017  . Hyperlipemia, mixed 08/14/2017  . Neuropathy 08/14/2017  . Depression, prolonged 08/14/2017  . Cervical disc disease 08/14/2017  . Diarrhea 08/14/2017  . Multiple pulmonary nodules 04/29/2016  . Iron deficiency anemia due to chronic blood loss 07/29/2015  . Tobacco dependence 07/01/2015  . PUD (peptic ulcer disease) 06/18/2014  . Bigeminy 09/03/2013  . Onychomycosis 02/25/2013  . Hallux valgus with bunions 01/14/2013  . Hammertoe 01/14/2013  . DDD (degenerative disc disease), lumbar 08/09/2012  . Atherosclerosis of native coronary artery of native heart with stable angina pectoris (HCC) 08/03/2012  . Hypothyroidism 07/02/2012  . GERD (gastroesophageal reflux disease) 07/02/2012  . Cervical spondylosis without myelopathy 07/21/2011  . Sciatica 07/21/2011  . Hypertension, essential 11/26/2010  . Paroxysmal atrial fibrillation (HCC) 11/26/2010    Past Surgical History:  Procedure Laterality Date  . ABDOMINAL HYSTERECTOMY    . APPENDECTOMY    . CARDIAC SURGERY    . CHOLECYSTECTOMY    . COLONOSCOPY WITH PROPOFOL Bilateral 08/01/2015   Procedure: COLONOSCOPY WITH PROPOFOL;  Surgeon: Scot Jun, MD;  Location: [facility] ENDOSCOPY;  Service: Endoscopy;  Laterality: Bilateral;  . ENDOVASCULAR REPAIR/STENT GRAFT  N/A 02/21/2018   Procedure: ENDOVASCULAR REPAIR/STENT GRAFT;  Surgeon: Renford Dills, MD;  Location: [facility] INVASIVE CV LAB;  Service: Cardiovascular;  Laterality: N/A;  . ESOPHAGOGASTRODUODENOSCOPY N/A 07/31/2015   Procedure: ESOPHAGOGASTRODUODENOSCOPY (EGD);  Surgeon: Scot Jun, MD;  Location:  [facility] ENDOSCOPY;  Service: Endoscopy;  Laterality: N/A;  . EYE SURGERY      Prior to Admission medications   Medication Sig Start Date End Date Taking? Authorizing Provider  acetaminophen (ACETAMINOPHEN 8 HOUR) 650 MG CR tablet Take 1,300 mg by mouth every 8 (eight) hours.    [provider]  albuterol (VENTOLIN HFA) 108 (90 Base) MCG/ACT inhaler Inhale 2 puffs into the lungs every 6 (six) hours as needed for wheezing or shortness of breath. 01/06/19   Altamese Dilling, MD  aspirin EC 81 MG tablet Take 81 mg by mouth daily.    [provider]  atorvastatin (LIPITOR) 40 MG tablet Take 1 tablet (40 mg total) by mouth daily. 02/13/20   Bacigalupo, Marzella Schlein, MD  carvedilol (COREG) 25 MG tablet Take 1 tablet (25 mg total) by mouth 2 (two) times daily with a meal. 10/02/19   Bacigalupo, Marzella Schlein, MD  cloNIDine (CATAPRES) 0.2 MG tablet Take 1 tablet (0.2 mg total) by mouth 2 (two) times daily. 10/02/19   Erasmo Downer, MD  ferrous sulfate 325 (65 FE) MG tablet Take 1 tablet (325 mg total) by mouth daily with breakfast. 08/01/15   Adrian Saran, MD  fluticasone (FLONASE) 50 MCG/ACT nasal spray Place into the nose. 08/15/19   [provider]  gabapentin (NEURONTIN) 600 MG tablet Take 1 tablet (600 mg total) by mouth 2 (two) times daily. 01/30/20   Erasmo Downer, MD  gemfibrozil (LOPID) 600 MG tablet TAKE 1 TABLET BY MOUTH TWICE DAILY BEFORE A MEAL 01/11/20   Bacigalupo, Marzella Schlein, MD  levothyroxine (SYNTHROID) 112 MCG tablet Take 1 tablet (112 mcg total) by mouth daily. 02/13/20   Bacigalupo, Marzella Schlein, MD  lisinopril (ZESTRIL) 40 MG tablet Take 1 tablet (40 mg total) by mouth daily. 01/30/20   Erasmo Downer, MD  Multiple Vitamins-Minerals (MULTIVITAMIN GUMMIES ADULT PO) Take 1 each by mouth daily.    [provider]  nitroGLYCERIN (NITROSTAT) 0.4 MG SL tablet Place 0.4 mg under the tongue every 5 (five) minutes as needed for chest pain.    [provider]  omeprazole (PRILOSEC) 20 MG capsule Take 1 capsule by mouth once daily 02/17/20   Erasmo Downer, MD  pravastatin (PRAVACHOL) 40 MG tablet TAKE 1 TABLET BY MOUTH AT BEDTIME 12/16/19   Erasmo Downer, MD  Venlafaxine HCl 225 MG TB24 Take 1 tablet by mouth once daily 03/12/20   Erasmo Downer, MD    Allergies Iodinated diagnostic agents and Sulfa antibiotics  Family History  Problem Relation Age of Onset  . Heart attack Father 57  . Brain cancer Sister   . Lung cancer Sister   . Breast cancer Neg Hx   . Colon cancer Neg Hx     Social History Social History   Tobacco Use  . Smoking status: Current Every Day Smoker    Packs/day: 1.00    Years: 62.00    Pack years: 62.00    Types: Cigarettes  . Smokeless tobacco: Never Used  Vaping Use  . Vaping Use: Never used  Substance Use Topics  . Alcohol use: Not Currently  . Drug use: Never    Review of Systems  Review of Systems  Unable to perform ROS: Severe respiratory distress  Constitutional: Negative for chills and fever.  Respiratory: Positive for shortness of breath. Negative for cough.   Cardiovascular: Negative for chest pain.      ____________________________________________   PHYSICAL EXAM:  VITAL SIGNS: ED Triage Vitals [04/02/20 0050]  Enc Vitals Group     BP      Pulse      Resp      Temp      Temp src      SpO2      Weight 171 lb 11.2 oz (77.9 kg)     Height 5\' 7"  (1.702 m)     Head Circumference      Peak Flow      Pain Score 0     Pain Loc      Pain Edu?      Excl. in GC?    Vitals:   04/02/20 0200 04/02/20 0230  BP: (!) 171/86 (!) 148/114  Pulse: (!) 103 (!) 101  Resp: 20 (!) 24  SpO2: 100% 100%   Physical Exam Vitals and nursing note reviewed.  Constitutional:      General: She is in acute distress.     Appearance: She is well-developed.  HENT:     Head: Normocephalic and atraumatic.     Right Ear: External ear normal.     Left Ear: External ear  normal.     Nose: Nose normal.  Eyes:     Conjunctiva/sclera: Conjunctivae normal.  Cardiovascular:     Rate and Rhythm: Normal rate and regular rhythm.     Heart sounds: No murmur heard.   Pulmonary:     Effort: Tachypnea, accessory muscle usage and respiratory distress present.     Breath sounds: Decreased breath sounds and wheezing present.  Abdominal:     Palpations: Abdomen is soft.     Tenderness: There is no abdominal tenderness.  Musculoskeletal:     Cervical back: Neck supple.     Right lower leg: Edema present.     Left lower leg: Edema present.  Skin:    General: Skin is warm and dry.     Capillary Refill: Capillary refill takes less than 2 seconds.  Neurological:     Mental Status: She is oriented to person, place, and time.  Psychiatric:        Mood and Affect: Mood normal.      ____________________________________________   LABS (all labs ordered are listed, but only abnormal results are displayed)  Labs Reviewed  LACTIC ACID, PLASMA - Abnormal; Notable for the following components:      Result Value   Lactic Acid, Venous 3.0 (*)    All other components within normal limits  COMPREHENSIVE METABOLIC PANEL - Abnormal; Notable for the following components:   CO2 21 (*)    Glucose, Bld 192 (*)    Calcium 8.6 (*)    GFR, Estimated 57 (*)    All other components within normal limits  CBC WITH DIFFERENTIAL/PLATELET - Abnormal; Notable for the following components:   RBC 3.53 (*)    Hemoglobin 10.8 (*)    HCT 33.7 (*)    All other components within normal limits  PROTIME-INR - Abnormal; Notable for the following components:   Prothrombin Time 15.3 (*)    INR 1.3 (*)    All other components within normal limits  BLOOD GAS, VENOUS - Abnormal; Notable for the following components:   Acid-base deficit 3.2 (*)  All other components within normal limits  BRAIN NATRIURETIC PEPTIDE - Abnormal; Notable for the following components:   B Natriuretic Peptide 394.4  (*)    All other components within normal limits  MAGNESIUM - Abnormal; Notable for the following components:   Magnesium 12.0 (*)    All other components within normal limits  TROPONIN I (HIGH SENSITIVITY) - Abnormal; Notable for the following components:   Troponin I (High Sensitivity) 101 (*)    All other components within normal limits  RESP PANEL BY RT-PCR (FLU A&B, COVID) ARPGX2  URINE CULTURE  CULTURE, BLOOD (ROUTINE X 2)  CULTURE, BLOOD (ROUTINE X 2)  APTT  PROCALCITONIN  URINALYSIS, COMPLETE (UACMP) WITH MICROSCOPIC  LACTIC ACID, PLASMA  MAGNESIUM  PROCALCITONIN  TROPONIN I (HIGH SENSITIVITY)   ____________________________________________  EKG  Sinus tachycardia with ventricular to 102, normal axis, left bundle branch block, prolonged QTc interval at 584, and some T wave inversions and depressions in the lateral leads as well as nonspecific ST changes in the inferior leads that are all present on prior EKGs.  Left bundle branch block is also present on prior EKG obtained on 9/20. ____________________________________________  RADIOLOGY  ED MD interpretation: No overt edema, focal consolidation, no thorax, right mediastinum, or other acute intrathoracic process.  Official radiology report(s): DG Chest Portable 1 View  Result Date: 04/02/2020 CLINICAL DATA:  Dyspnea EXAM: PORTABLE CHEST 1 VIEW COMPARISON:  01/05/2019 FINDINGS: The lungs are stable leak, symmetrically mildly hyperinflated in keeping with changes of underlying COPD. Interstitial prominence likely relates to underlying interstitial lung disease. No pneumothorax or pleural effusion. Cardiac size within normal limits. No acute bone abnormality. IMPRESSION: No active disease.  COPD. Electronically Signed   By: Helyn Numbers MD   On: 04/02/2020 01:20    ____________________________________________   PROCEDURES  Procedure(s) performed (including Critical Care):  .Critical Care Performed by: Gilles Chiquito, MD Authorized by: Gilles Chiquito, MD   Critical care provider statement:    Critical care time (minutes):  45   Critical care time was exclusive of:  Separately billable procedures and treating other patients   Critical care was necessary to treat or prevent imminent or life-threatening deterioration of the following conditions:  Respiratory failure   Critical care was time spent personally by me on the following activities:  Discussions with consultants, evaluation of patient's response to treatment, examination of patient, ordering and performing treatments and interventions, ordering and review of laboratory studies, ordering and review of radiographic studies, pulse oximetry, re-evaluation of patient's condition, obtaining history from patient or surrogate and review of old charts     ____________________________________________   INITIAL IMPRESSION / ASSESSMENT AND PLAN / ED COURSE        Patient presents with [place] to history exam for assessment of acute onset shortness of breath as well as acute toxic respiratory failure.  On arrival patient was immediately transitioned to BiPAP.  Arrival vital signs remarkable for hypertension with a BP of 183/100, tachypnea with respiratory of 28 and heart rate of 90.  Overall impression is likely COPD exacerbation given wheezing on exam and hypoxia with significant work of breathing.  In addition patient has had some evidence of mild hypercarbic respiratory failure with a VBG showing a pH of 7.25 with a PCO2 of 56 and a bicarb of 24.6.  CBC remarkable for evidence of anemia with hemoglobin of 10.8 which is at patient's baseline compared to hemoglobin obtained 1 month ago of 10.9.  Low suspicion for  acute symptomatic anemia.  BNP is slightly elevated at 394.4 and given patient has a little bit of edema on exam she may have a small component of heart failure contributing to her respiratory distress.  Initial lactic acid is slightly elevated at 3  suspect this is related to patient's respiratory distress, increased sympathomimetic tone, and treatments administered prior to arrival and I will lower suspicion for sepsis at this time.  However in addition to receiving duo nebs patient did receive antibiotics in the ED to cover for infectious etiologies that could be contributing to her COPD exacerbation.  No focal consolidation on chest x-ray or evidence of pneumothorax or overt edema.  Patient will be admitted to medical ICU for further evaluation management  ____________________________________________   FINAL CLINICAL IMPRESSION(S) / ED DIAGNOSES  Final diagnoses:  Acute respiratory failure with hypoxia and hypercapnia (HCC)  Troponin I above reference range  Lactic acid acidosis    Medications  azithromycin (ZITHROMAX) 500 mg in sodium chloride 0.9 % 250 mL IVPB (500 mg Intravenous New Bag/Given 04/02/20 0228)  docusate sodium (COLACE) capsule 100 mg (has no administration in time range)  polyethylene glycol (MIRALAX / GLYCOLAX) packet 17 g (has no administration in time range)  enoxaparin (LOVENOX) injection 40 mg (has no administration in time range)  acetaminophen (TYLENOL) tablet 650 mg (has no administration in time range)  ondansetron (ZOFRAN) injection 4 mg (has no administration in time range)  ipratropium-albuterol (DUONEB) 0.5-2.5 (3) MG/3ML nebulizer solution 9 mL (9 mLs Nebulization Given 04/02/20 0200)  cefTRIAXone (ROCEPHIN) 1 g in sodium chloride 0.9 % 100 mL IVPB (0 g Intravenous Stopped 04/02/20 0230)  aspirin chewable tablet 324 mg (324 mg Oral Given 04/02/20 0200)     ED Discharge Orders    None       Note:  This document was prepared using Dragon voice recognition software and may include unintentional dictation errors.   Gilles Chiquito, MD 04/02/20 [phone]

## 2020-04-03 ENCOUNTER — Encounter
Admission: EM | Disposition: A | Discharge status: Home / Self Care | Payer: Self-pay | Source: Home / Self Care | Attending: Pulmonary Disease

## 2020-04-03 ENCOUNTER — Encounter: Payer: Self-pay | Admitting: Internal Medicine

## 2020-04-03 DIAGNOSIS — Z720 Tobacco use: Secondary | ICD-10-CM

## 2020-04-03 DIAGNOSIS — J441 Chronic obstructive pulmonary disease with (acute) exacerbation: Secondary | ICD-10-CM

## 2020-04-03 DIAGNOSIS — I249 Acute ischemic heart disease, unspecified: Secondary | ICD-10-CM

## 2020-04-03 DIAGNOSIS — J9601 Acute respiratory failure with hypoxia: Secondary | ICD-10-CM

## 2020-04-03 DIAGNOSIS — I10 Essential (primary) hypertension: Secondary | ICD-10-CM

## 2020-04-03 DIAGNOSIS — R443 Hallucinations, unspecified: Secondary | ICD-10-CM

## 2020-04-03 HISTORY — PX: LEFT HEART CATH AND CORONARY ANGIOGRAPHY: CATH118249

## 2020-04-03 LAB — GLUCOSE, CAPILLARY
Glucose-Capillary: 195 mg/dL — ABNORMAL HIGH (ref 70–99)
Glucose-Capillary: 175 mg/dL — ABNORMAL HIGH (ref 70–99)

## 2020-04-03 LAB — BASIC METABOLIC PANEL
Anion gap: 12 (ref 5–15)
BUN: 32 mg/dL — ABNORMAL HIGH (ref 8–23)
CO2: 26 mmol/L (ref 22–32)
Calcium: 8.9 mg/dL (ref 8.9–10.3)
Chloride: 101 mmol/L (ref 98–111)
Creatinine, Ser: 1.06 mg/dL — ABNORMAL HIGH (ref 0.44–1.00)
GFR, Estimated: 53 mL/min — ABNORMAL LOW (ref >60)
Glucose, Bld: 205 mg/dL — ABNORMAL HIGH (ref 70–99)
Potassium: 3.5 mmol/L (ref 3.5–5.1)
Sodium: 139 mmol/L (ref 135–145)

## 2020-04-03 LAB — CBC
HCT: 30.3 % — ABNORMAL LOW (ref 36.0–46.0)
Hemoglobin: 10 g/dL — ABNORMAL LOW (ref 12.0–15.0)
MCH: 31.1 pg (ref 26.0–34.0)
MCHC: 33 g/dL (ref 30.0–36.0)
MCV: 94.1 fL (ref 80.0–100.0)
Platelets: 220 10*3/uL (ref 150–400)
RBC: 3.22 MIL/uL — ABNORMAL LOW (ref 3.87–5.11)
RDW: 13.6 % (ref 11.5–15.5)
WBC: 10.8 10*3/uL — ABNORMAL HIGH (ref 4.0–10.5)
nRBC: 0 % (ref 0.0–0.2)

## 2020-04-03 LAB — HEPARIN LEVEL (UNFRACTIONATED): Heparin Unfractionated: 0.62 IU/mL (ref 0.30–0.70)

## 2020-04-03 LAB — PROCALCITONIN: Procalcitonin: <0.1 ng/mL

## 2020-04-03 SURGERY — LEFT HEART CATH AND CORONARY ANGIOGRAPHY
Anesthesia: Moderate Sedation

## 2020-04-03 MED ORDER — SODIUM CHLORIDE 0.9% FLUSH
3.0000 mL | Freq: Two times a day (BID) | INTRAVENOUS | Status: DC
  Administered 2020-04-03: 3 mL via INTRAVENOUS

## 2020-04-03 MED ORDER — SODIUM CHLORIDE 0.9 % IV SOLN: 250.0000 mL | INTRAVENOUS | Status: DC | PRN

## 2020-04-03 MED ORDER — FUROSEMIDE 10 MG/ML IJ SOLN
INTRAMUSCULAR | Status: AC
  Filled 2020-04-03: qty 4

## 2020-04-03 MED ORDER — LABETALOL HCL 5 MG/ML IV SOLN: 10.0000 mg | INTRAVENOUS | Status: AC | PRN

## 2020-04-03 MED ORDER — HYDRALAZINE HCL 20 MG/ML IJ SOLN: 10.0000 mg | INTRAMUSCULAR | Status: AC | PRN

## 2020-04-03 MED ORDER — IOHEXOL 300 MG/ML  SOLN
INTRAMUSCULAR | Status: DC | PRN
  Administered 2020-04-03: 100 mL

## 2020-04-03 MED ORDER — ASPIRIN 81 MG PO CHEW: 81.0000 mg | CHEWABLE_TABLET | Freq: Every day | ORAL | Status: DC

## 2020-04-03 MED ORDER — ISOSORBIDE MONONITRATE ER 30 MG PO TB24
30.0000 mg | ORAL_TABLET | Freq: Every day | ORAL | Status: DC
  Administered 2020-04-03: 30 mg via ORAL
  Filled 2020-04-03: qty 1

## 2020-04-03 MED ORDER — AMLODIPINE BESYLATE 5 MG PO TABS
5.0000 mg | ORAL_TABLET | Freq: Every day | ORAL | 0 refills | Status: DC
Start: 2020-04-04 — End: 2020-05-04

## 2020-04-03 MED ORDER — HEPARIN (PORCINE) IN NACL 1000-0.9 UT/500ML-% IV SOLN
INTRAVENOUS | Status: DC | PRN
  Administered 2020-04-03: 500 mL

## 2020-04-03 MED ORDER — LIDOCAINE HCL (PF) 1 % IJ SOLN
INTRAMUSCULAR | Status: AC
  Filled 2020-04-03: qty 30

## 2020-04-03 MED ORDER — FENTANYL CITRATE (PF) 100 MCG/2ML IJ SOLN
INTRAMUSCULAR | Status: DC | PRN
  Administered 2020-04-03: 25 ug via INTRAVENOUS

## 2020-04-03 MED ORDER — MIDAZOLAM HCL 2 MG/2ML IJ SOLN
INTRAMUSCULAR | Status: DC | PRN
  Administered 2020-04-03: 1 mg via INTRAVENOUS

## 2020-04-03 MED ORDER — MIDAZOLAM HCL 2 MG/2ML IJ SOLN
INTRAMUSCULAR | Status: AC
  Filled 2020-04-03: qty 2

## 2020-04-03 MED ORDER — FENTANYL CITRATE (PF) 100 MCG/2ML IJ SOLN
INTRAMUSCULAR | Status: AC
  Filled 2020-04-03: qty 2

## 2020-04-03 MED ORDER — FUROSEMIDE 10 MG/ML IJ SOLN
INTRAMUSCULAR | Status: DC | PRN
  Administered 2020-04-03: 40 mg via INTRAVENOUS

## 2020-04-03 MED ORDER — HEPARIN SODIUM (PORCINE) 1000 UNIT/ML IJ SOLN
INTRAMUSCULAR | Status: AC
  Filled 2020-04-03: qty 1

## 2020-04-03 MED ORDER — VERAPAMIL HCL 2.5 MG/ML IV SOLN
INTRAVENOUS | Status: AC
  Filled 2020-04-03: qty 2

## 2020-04-03 MED ORDER — LIDOCAINE HCL (PF) 1 % IJ SOLN
INTRAMUSCULAR | Status: DC | PRN
  Administered 2020-04-03: 2 mL

## 2020-04-03 MED ORDER — ISOSORBIDE MONONITRATE ER 30 MG PO TB24
30.0000 mg | ORAL_TABLET | Freq: Every day | ORAL | 0 refills | Status: DC
Start: 2020-04-04 — End: 2020-05-04

## 2020-04-03 MED ORDER — AMLODIPINE BESYLATE 5 MG PO TABS
5.0000 mg | ORAL_TABLET | Freq: Two times a day (BID) | ORAL | Status: DC
  Administered 2020-04-03: 5 mg via ORAL
  Filled 2020-04-03: qty 1

## 2020-04-03 MED ORDER — HEPARIN (PORCINE) IN NACL 1000-0.9 UT/500ML-% IV SOLN
INTRAVENOUS | Status: AC
  Filled 2020-04-03: qty 1000

## 2020-04-03 MED ORDER — SODIUM CHLORIDE 0.9% FLUSH: 3.0000 mL | INTRAVENOUS | Status: DC | PRN

## 2020-04-03 MED ORDER — HEPARIN SODIUM (PORCINE) 1000 UNIT/ML IJ SOLN
INTRAMUSCULAR | Status: DC | PRN
  Administered 2020-04-03: 3500 [IU] via INTRAVENOUS

## 2020-04-03 MED ORDER — FLUTICASONE FUROATE-VILANTEROL 200-25 MCG/INH IN AEPB: 1.0000 | INHALATION_SPRAY | Freq: Every morning | RESPIRATORY_TRACT | 0 refills | Status: DC

## 2020-04-03 MED ORDER — ACETAMINOPHEN 325 MG PO TABS: 650.0000 mg | ORAL_TABLET | ORAL | Status: DC | PRN

## 2020-04-03 MED ORDER — ONDANSETRON HCL 4 MG/2ML IJ SOLN: 4.0000 mg | Freq: Four times a day (QID) | INTRAMUSCULAR | Status: DC | PRN

## 2020-04-03 MED ORDER — VERAPAMIL HCL 2.5 MG/ML IV SOLN
INTRAVENOUS | Status: DC | PRN
  Administered 2020-04-03: 2.5 mg via INTRA_ARTERIAL

## 2020-04-03 SURGICAL SUPPLY — 8 items
CATH INFINITI 5 FR JL3.5 (CATHETERS) ×2 IMPLANT
CATH INFINITI JR4 5F (CATHETERS) ×2 IMPLANT
DEVICE RAD TR BAND REGULAR (VASCULAR PRODUCTS) ×2 IMPLANT
GLIDESHEATH SLEND SS 6F .021 (SHEATH) ×2 IMPLANT
GUIDEWIRE INQWIRE 1.5J.035X260 (WIRE) ×1 IMPLANT
INQWIRE 1.5J .035X260CM (WIRE) ×2
KIT MANI 3VAL PERCEP (MISCELLANEOUS) ×2 IMPLANT
PACK CARDIAC CATH (CUSTOM PROCEDURE TRAY) ×2 IMPLANT

## 2020-04-03 NOTE — Progress Notes (Signed)
ANTICOAGULATION CONSULT NOTE - Initial Consult  Pharmacy Consult for Heparin  Indication: chest pain/ACS  Allergies  Allergen Reactions  . Iodinated Diagnostic Agents Hives, Itching and Rash    She states she has to be premedicated. SPM  . Sulfa Antibiotics Rash    Patient Measurements: Height: 5\' 6"  (167.6 cm) Weight: 75.5 kg (166 lb 7.2 oz) IBW/kg (Calculated) : 59.3 Heparin Dosing Weight: 74.4 kg   Vital Signs: BP: 139/98 (12/02 2258) Pulse Rate: 72 (12/02 2300)  Labs: Recent Labs    04/02/20 0056 04/02/20 0056 04/02/20 0322 04/02/20 0641 04/02/20 0907 04/02/20 1608 04/03/20 0047  HGB 10.8*  --   --   --   --   --   --   HCT 33.7*  --   --   --   --   --   --   PLT 247  --   --   --   --   --   --   APTT 26  --   --   --   --   --   --   LABPROT 15.3*  --   --   --   --   --   --   INR 1.3*  --   --   --   --   --   --   HEPARINUNFRC  --   --   --   --   --  0.80* 0.62  CREATININE 0.99  --   --   --   --   --   --   TROPONINIHS 101*   < > 458* 529* 525*  --   --    < > = values in this interval not displayed.    Estimated Creatinine Clearance: 46.3 mL/min (by C-G formula based on SCr of 0.99 mg/dL).   Medical History: Past Medical History:  Diagnosis Date  . AAA (abdominal aortic aneurysm) (HCC)   . Allergy   . Anemia   . Atrial fibrillation (HCC)   . Blood transfusion without reported diagnosis   . CAD (coronary artery disease)    s/p PCI to LAD & RCA  . COPD (chronic obstructive pulmonary disease) (HCC)   . Depression   . Emphysema of lung (HCC)   . GERD (gastroesophageal reflux disease)   . History of blood clots   . Hyperlipidemia   . Hypertension   . Personal history of tobacco use, presenting hazards to health 07/01/2015  . Renal disorder   . Thyroid disease     Medications:  Medications Prior to Admission  Medication Sig Dispense Refill Last Dose  . aspirin EC 81 MG tablet Take 81 mg by mouth daily.   04/01/2020 at 0800  . carvedilol  (COREG) 25 MG tablet Take 1 tablet (25 mg total) by mouth 2 (two) times daily with a meal. 180 tablet 1 04/01/2020 at 0800  . cloNIDine (CATAPRES) 0.2 MG tablet Take 1 tablet (0.2 mg total) by mouth 2 (two) times daily. 180 tablet 1 04/01/2020 at 0800  . ferrous sulfate 325 (65 FE) MG tablet Take 1 tablet (325 mg total) by mouth daily with breakfast. 60 tablet 3 04/01/2020 at 0800  . fluticasone (FLONASE) 50 MCG/ACT nasal spray Place into the nose.   Past Week at Unknown time  . gabapentin (NEURONTIN) 600 MG tablet Take 1 tablet (600 mg total) by mouth 2 (two) times daily. 180 tablet 1 04/01/2020 at 0800  . gemfibrozil (LOPID) 600 MG tablet TAKE  1 TABLET BY MOUTH TWICE DAILY BEFORE A MEAL (Patient taking differently: Take 600 mg by mouth 2 (two) times daily before a meal. ) 180 tablet 3 04/01/2020 at 0800  . levothyroxine (SYNTHROID) 112 MCG tablet Take 1 tablet (112 mcg total) by mouth daily. 90 tablet 1 04/01/2020 at 0730  . lisinopril (ZESTRIL) 40 MG tablet Take 1 tablet (40 mg total) by mouth daily. 90 tablet 0 04/01/2020 at 0800  . Multiple Vitamins-Minerals (MULTIVITAMIN GUMMIES ADULT PO) Take 1 each by mouth daily.   Past Week at Unknown time  . nitroGLYCERIN (NITROSTAT) 0.4 MG SL tablet Place 0.4 mg under the tongue every 5 (five) minutes as needed for chest pain.   Past Week at prn  . omeprazole (PRILOSEC) 20 MG capsule Take 1 capsule by mouth once daily (Patient taking differently: Take 20 mg by mouth daily. ) 90 capsule 0 04/01/2020 at 0800  . Venlafaxine HCl 225 MG TB24 Take 1 tablet by mouth once daily (Patient taking differently: Take 225 mg by mouth daily. ) 90 tablet 1 04/01/2020 at 0800  . acetaminophen (ACETAMINOPHEN 8 HOUR) 650 MG CR tablet Take 1,300 mg by mouth every 8 (eight) hours.   unknown at prn  . albuterol (VENTOLIN HFA) 108 (90 Base) MCG/ACT inhaler Inhale 2 puffs into the lungs every 6 (six) hours as needed for wheezing or shortness of breath. 18 g 1 unknown at prn  . atorvastatin  (LIPITOR) 40 MG tablet Take 1 tablet (40 mg total) by mouth daily. 90 tablet 1 03/31/2020 at 2000  . pravastatin (PRAVACHOL) 40 MG tablet TAKE 1 TABLET BY MOUTH AT BEDTIME (Patient taking differently: Take 40 mg by mouth at bedtime. ) 90 tablet 0 03/31/2020 at 2000    Assessment: Pharmacy consulted to dose heparin in this 84 year old female admitted with ACS/NSTEMI.  No prior anticoag. CrCl = 46.2 ml/min   Date HL Rate 12/2 -- 4000unit x1; then 900 unit/hr  12/2 0.8    Goal of Therapy:  Heparin level 0.3-0.7 units/ml Monitor platelets by anticoagulation protocol: Yes   Plan:  12/3:  HL @ 0047 = 0.62 Will continue pt on current rate and draw confirmation level on 12/3 @ 0900.  Vue Pavon D 04/03/2020,1:43 AM

## 2020-04-03 NOTE — Progress Notes (Signed)
Eileen Mack. [facility]    SUBJECTIVE: Status post cardiac cath no further anginal symptoms shortness of breath is improved patient feels well enough would like to consider being discharged home today for follow-up as an outpatient tolerating medications well advised to refrain from smoking still has issues with blood pressure   Vitals:   04/03/20 1015 04/03/20 1030 04/03/20 1100 04/03/20 1105  BP: (!) 170/71 (!) 168/72  (!) 193/75  Pulse: (!) 57 (!) 56 (!) 57 (!) 56  Resp: 18 17 18 16   Temp:   (!) 97.5 F (36.4 C)   TempSrc:   Oral   SpO2: 96% 95% 96% 95%  Weight:      Height:         Intake/Output Summary (Last 24 hours) at 04/03/2020 1141 Last data filed at 04/03/2020 1100 Gross per 24 hour  Intake 797.75 ml  Output 350 ml  Net 447.75 ml      PHYSICAL EXAM  General: Well developed, well nourished, in no acute distress HEENT:  Normocephalic and atramatic Neck:  No JVD.  Lungs: Clear bilaterally to auscultation and percussion. Heart: HRRR . Normal S1 and S2 without gallops or murmurs.  Abdomen: Bowel sounds are positive, abdomen soft and non-tender  Msk:  Back normal, normal gait. Normal strength and tone for age. Extremities: No clubbing, cyanosis or edema.   Neuro: Alert and oriented X 3. Psych:  Good affect, responds appropriately   LABS: Basic Metabolic Panel: Recent Labs    04/02/20 0056 04/02/20 0322 04/03/20 0445  NA 136  --  139  K 3.7  --  3.5  CL 103  --  101  CO2 21*  --  26  GLUCOSE 192*  --  205*  BUN 19  --  32*  CREATININE 0.99  --  1.06*  CALCIUM 8.6*  --  8.9  MG 12.0* 1.7  --    Liver Function Tests: Recent Labs    04/02/20 0056  AST 24  ALT 13  ALKPHOS 86  BILITOT 0.5  PROT 7.0  ALBUMIN 3.5   No results for input(s): LIPASE, AMYLASE in the last 72 hours. CBC: Recent Labs    04/02/20 0056 04/03/20 0445  WBC 4.6 10.8*  NEUTROABS 2.5  --   HGB 10.8* 10.0*  HCT 33.7* 30.3*  MCV 95.5 94.1  PLT 247 220   Cardiac Enzymes: No  results for input(s): CKTOTAL, CKMB, CKMBINDEX, TROPONINI in the last 72 hours. BNP: Invalid input(s): POCBNP D-Dimer: No results for input(s): DDIMER in the last 72 hours. Hemoglobin A1C: No results for input(s): HGBA1C in the last 72 hours. Fasting Lipid Panel: No results for input(s): CHOL, HDL, LDLCALC, TRIG, CHOLHDL, LDLDIRECT in the last 72 hours. Thyroid Function Tests: No results for input(s): TSH, T4TOTAL, T3FREE, THYROIDAB in the last 72 hours.  Invalid input(s): FREET3 Anemia Panel: No results for input(s): VITAMINB12, FOLATE, FERRITIN, TIBC, IRON, RETICCTPCT in the last 72 hours.  CARDIAC CATHETERIZATION  Result Date: 04/03/2020  Prox RCA to Mid RCA lesion is 25% stenosed.  Prox LAD lesion is 50% stenosed.  Prox LAD to Mid LAD lesion is 10% stenosed.  Widely patent mid RCA stent  Widely patent mid LAD stent  Preserved left ventricular function EF of 55%  2nd Diag lesion is 75% stenosed.  TIMI-3 flow in all vessels  Conclusion Diagnostic cardiac catheter right radial approach Preserved left ventricular function of 55% Large normal left main large LAD with a 50% proximal lesion widely patent mid LAD  stent 75% ostial diagonal 1 Circumflex is large and free of disease RCA was large and had a 50% mid stent widely patent right dominant system Intervention deferred FFR and IFR were both deferred Recommend aggressive medical therapy TR band applied   DG Chest Portable 1 View  Result Date: 04/02/2020 CLINICAL DATA:  Dyspnea EXAM: PORTABLE CHEST 1 VIEW COMPARISON:  01/05/2019 FINDINGS: The lungs are stable leak, symmetrically mildly hyperinflated in keeping with changes of underlying COPD. Interstitial prominence likely relates to underlying interstitial lung disease. No pneumothorax or pleural effusion. Cardiac size within normal limits. No acute bone abnormality. IMPRESSION: No active disease.  COPD. Electronically Signed   By: Helyn Numbers MD   On: 04/02/2020 01:20    ECHOCARDIOGRAM COMPLETE  Result Date: 04/02/2020    ECHOCARDIOGRAM REPORT   Patient Name:   Eileen Mack [facility] Date of Exam: 04/02/2020 Medical Rec #:  [id number]            Height:       66.0 in Accession #:    [id number]           Weight:       165.3 lb Date of Birth:  06/19/1935           BSA:          1.844 m Patient Age:    83 years             BP:           167/83 mmHg Patient Gender: F                    HR:           91 bpm. Exam Location:  [facility] Procedure: 2D Echo, Cardiac Doppler and Color Doppler Indications:     Cardiiomyopathy-unspecified 425.9  History:         Patient has prior history of Echocardiogram examinations, most                  recent 02/20/2018. CAD, COPD, Arrythmias:Atrial Fibrillation;                  Risk Factors:Hypertension.  Sonographer:     Cristela Blue RDCS (AE) Referring Phys:  [id number] BRITTON L [place] Diagnosing Phys: Alwyn Pea MD IMPRESSIONS  1. Left ventricular ejection fraction, by estimation, is 60 to 65%. The left ventricle has normal function. The left ventricle has no regional wall motion abnormalities. Left ventricular diastolic parameters were normal.  2. Right ventricular systolic function is normal. The right ventricular size is normal.  3. The mitral valve is normal in structure. No evidence of mitral valve regurgitation.  4. The aortic valve is normal in structure. Aortic valve regurgitation is not visualized. FINDINGS  Left Ventricle: Left ventricular ejection fraction, by estimation, is 60 to 65%. The left ventricle has normal function. The left ventricle has no regional wall motion abnormalities. The left ventricular internal cavity size was normal in size. There is  borderline left ventricular hypertrophy. Left ventricular diastolic parameters were normal. Right Ventricle: The right ventricular size is normal. No increase in right ventricular wall thickness. Right ventricular systolic function is normal. Left Atrium: Left atrial size was normal  in size. Right Atrium: Right atrial size was normal in size. Pericardium: There is no evidence of pericardial effusion. Mitral Valve: The mitral valve is normal in structure. No evidence of mitral valve regurgitation. Tricuspid Valve: The tricuspid valve is normal in structure. Tricuspid valve  regurgitation is not demonstrated. Aortic Valve: The aortic valve is normal in structure. Aortic valve regurgitation is not visualized. Aortic valve mean gradient measures 6.5 mmHg. Aortic valve peak gradient measures 10.5 mmHg. Aortic valve area, by VTI measures 2.66 cm. Pulmonic Valve: The pulmonic valve was normal in structure. Pulmonic valve regurgitation is not visualized. Aorta: The aortic arch was not well visualized. IAS/Shunts: No atrial level shunt detected by color flow Doppler.  LEFT VENTRICLE PLAX 2D LVIDd:         4.83 cm     Diastology LVIDs:         3.44 cm     LV e' medial:    7.83 cm/s LV PW:         1.30 cm     LV E/e' medial:  13.2 LV IVS:        1.45 cm     LV e' lateral:   5.22 cm/s LVOT diam:     2.00 cm     LV E/e' lateral: 19.7 LV SV:         74 LV SV Index:   40 LVOT Area:     3.14 cm  LV Volumes (MOD) LV vol d, MOD A2C: 76.9 ml LV vol d, MOD A4C: 88.7 ml LV vol s, MOD A2C: 48.0 ml LV vol s, MOD A4C: 41.7 ml LV SV MOD A2C:     28.9 ml LV SV MOD A4C:     88.7 ml LV SV MOD BP:      40.8 ml RIGHT VENTRICLE RV Basal diam:  3.13 cm RV S prime:     14.10 cm/s TAPSE (M-mode): 3.8 cm LEFT ATRIUM             Index       RIGHT ATRIUM           Index LA diam:        3.50 cm 1.90 cm/m  RA Area:     15.60 cm LA Vol (A2C):   81.6 ml 44.24 ml/m RA Volume:   38.90 ml  21.09 ml/m LA Vol (A4C):   61.0 ml 33.07 ml/m LA Biplane Vol: 74.4 ml 40.34 ml/m  AORTIC VALVE                    PULMONIC VALVE AV Area (Vmax):    2.37 cm     PV Vmax:        0.72 m/s AV Area (Vmean):   2.31 cm     PV Peak grad:   2.1 mmHg AV Area (VTI):     2.66 cm     RVOT Peak grad: 8 mmHg AV Vmax:           162.00 cm/s AV Vmean:           115.000 cm/s AV VTI:            0.279 m AV Peak Grad:      10.5 mmHg AV Mean Grad:      6.5 mmHg LVOT Vmax:         122.00 cm/s LVOT Vmean:        84.400 cm/s LVOT VTI:          0.236 m LVOT/AV VTI ratio: 0.85  AORTA Ao Root diam: 3.00 cm MITRAL VALVE                TRICUSPID VALVE MV Area (PHT): 5.27 cm     TR Peak grad:  23.8 mmHg MV Decel Time: 144 msec     TR Vmax:        244.00 cm/s MV E velocity: 103.00 cm/s MV A velocity: 98.50 cm/s   SHUNTS MV E/A ratio:  1.05         Systemic VTI:  0.24 m                             Systemic Diam: 2.00 cm Nadim Malia D Saphira Lahmann MD Electronically signed by Alwyn Pea MD Signature Date/Time: 04/02/2020/1:41:34 PM    Final      Echo echocardiogram preserved left ventricular function with EF around 60%  TELEMETRY: Left bundle branch block sinus rhythm rate of 80  ASSESSMENT AND PLAN:  Active Problems:   Acute respiratory failure (HCC) Unstable angina Known coronary artery disease PCI and stent LAD RCA Hyperlipidemia Smoking Left bundle branch block Status post cardiac cath    Plan Recommend medical therapy for angina We will start Imdur 30 mg once a day to help with angina Start amlodipine therapy to also help with probable angina Aspirin 81 mg daily consider adding Plavix 75 mg daily Statin therapy for hyperlipidemia lipid 40 mg once a day Hypertension management with clonidine Coreg amlodipine Imdur Agree with steroid therapy for COPD in addition to inhalers follow-up with pulmonary GERD type symptoms with Protonix and Pepcid Hopefully discharge later today Follow-up with cardiology 1 to 2 weeks     Alwyn Pea, MD 04/03/2020 11:41 AM

## 2020-04-03 NOTE — Discharge Summary (Signed)
[facility] [place] [facility]   PATIENT NAME: Eileen Mack    MR#:  [id number]  DATE OF BIRTH:  09/30/1935  DATE OF ADMISSION:  04/02/2020 ADMITTING PHYSICIAN: Vida Rigger, MD  DATE OF DISCHARGE: 04/03/2020  PRIMARY CARE PHYSICIAN: Erasmo Downer, MD    ADMISSION DIAGNOSIS:  Acute respiratory failure (HCC) [J96.00] Lactic acid acidosis [E87.2] Troponin I above reference range [R77.8] Acute respiratory failure with hypoxia and hypercapnia (HCC) [J96.01, J96.02]  DISCHARGE DIAGNOSIS:  Active Problems:   Acute respiratory failure (HCC)   SECONDARY DIAGNOSIS:   Past Medical History:  Diagnosis Date  . AAA (abdominal aortic aneurysm) (HCC)   . Allergy   . Anemia   . Atrial fibrillation (HCC)   . Blood transfusion without reported diagnosis   . CAD (coronary artery disease)    s/p PCI to LAD & RCA  . COPD (chronic obstructive pulmonary disease) (HCC)   . Depression   . Emphysema of lung (HCC)   . GERD (gastroesophageal reflux disease)   . History of blood clots   . Hyperlipidemia   . Hypertension   . Personal history of tobacco use, presenting hazards to health 07/01/2015  . Renal disorder   . Thyroid disease     HOSPITAL COURSE:   1.  Acute hypoxic respiratory failure initially requiring BiPAP on presentation.  VBG showed a pH of 7.25 (less than 7.35).  Patient initially had a pulse ox of 89% on CPAP.  Respiratory status improved dramatically and patient now breathing comfortably on room air without any shortness of breath.  Patient ambulated with the nurse around the nursing station and did well. No complaints of shortness of breath. 2.  Acute coronary syndrome.  Patient had a cardiac catheterization by Dr. Juliann Pares on 04/03/2020.  Cardiac cath showed an EF of 55%.  Proximal LAD 50% stenosed.  Widely patent RCA and mid LAD stent.  Second diagonal lesion is 75% stenosis.  Medical management.  Cardiology added Imdur and amlodipine.   Patient already on Coreg, atorvastatin and aspirin.  Troponin went from 101 up to 458 then 529 and then down to 525.  Added on the lipid profile 3.  COPD exacerbation.  Patient was given steroids on presentation and today.  Lungs are clear.  Because of her hallucinations, I am nervous about further steroids.  Prescribed Breo inhaler already has albuterol at home. 4.  Tobacco abuse must stop smoking 5.  Accelerated hypertension.  Blood pressure better after adding Norvasc.  Last 3 blood pressures 131/66, 145/66 and 163/56.  Consider tapering off clonidine as outpatient. 6.  Hyperlipidemia on atorvastatin.  Added on a lipid profile 7.  Hypothyroidism unspecified on levothyroxine 8.  GERD on PPI 9.  Hallucinations while here in the hospital likely secondary to being out of familiar environment and IV Solu-Medrol.  Spoke with patient's daughter and they have good family support and someone will be with her for the next 4 days.  Of note initial magnesium of 12 was a lab error.  Repeat 1.7.  DISCHARGE CONDITIONS:   Satisfactory  CONSULTS OBTAINED:  Treatment Team:  Pccm, Raymond Gurney, MD Lamar Blinks, MD  DRUG ALLERGIES:   Allergies  Allergen Reactions  . Iodinated Diagnostic Agents Hives, Itching and Rash    She states she has to be premedicated. SPM  . Sulfa Antibiotics Rash    DISCHARGE MEDICATIONS:   Allergies as of 04/03/2020      Reactions   Iodinated Diagnostic Agents Hives, Itching, Rash  She states she has to be premedicated. SPM   Sulfa Antibiotics Rash      Medication List    STOP taking these medications   lisinopril 40 MG tablet Commonly known as: ZESTRIL   pravastatin 40 MG tablet Commonly known as: PRAVACHOL     TAKE these medications   Acetaminophen 8 Hour 650 MG CR tablet Generic drug: acetaminophen Take 1,300 mg by mouth every 8 (eight) hours.   albuterol 108 (90 Base) MCG/ACT inhaler Commonly known as: VENTOLIN HFA Inhale 2 puffs into the lungs  every 6 (six) hours as needed for wheezing or shortness of breath.   amLODipine 5 MG tablet Commonly known as: NORVASC Take 1 tablet (5 mg total) by mouth daily. Start taking on: April 04, 2020   aspirin EC 81 MG tablet Take 81 mg by mouth daily.   atorvastatin 40 MG tablet Commonly known as: LIPITOR Take 1 tablet (40 mg total) by mouth daily.   carvedilol 25 MG tablet Commonly known as: COREG Take 1 tablet (25 mg total) by mouth 2 (two) times daily with a meal.   cloNIDine 0.2 MG tablet Commonly known as: CATAPRES Take 1 tablet (0.2 mg total) by mouth 2 (two) times daily.   ferrous sulfate 325 (65 FE) MG tablet Take 1 tablet (325 mg total) by mouth daily with breakfast.   fluticasone 50 MCG/ACT nasal spray Commonly known as: FLONASE Place into the nose.   fluticasone furoate-vilanterol 200-25 MCG/INH Aepb Commonly known as: BREO ELLIPTA Inhale 1 puff into the lungs every morning.   gabapentin 600 MG tablet Commonly known as: NEURONTIN Take 1 tablet (600 mg total) by mouth 2 (two) times daily.   gemfibrozil 600 MG tablet Commonly known as: LOPID TAKE 1 TABLET BY MOUTH TWICE DAILY BEFORE A MEAL What changed: See the new instructions.   isosorbide mononitrate 30 MG 24 hr tablet Commonly known as: IMDUR Take 1 tablet (30 mg total) by mouth daily. Start taking on: April 04, 2020   levothyroxine 112 MCG tablet Commonly known as: SYNTHROID Take 1 tablet (112 mcg total) by mouth daily.   MULTIVITAMIN GUMMIES ADULT PO Take 1 each by mouth daily.   nitroGLYCERIN 0.4 MG SL tablet Commonly known as: NITROSTAT Place 0.4 mg under the tongue every 5 (five) minutes as needed for chest pain.   omeprazole 20 MG capsule Commonly known as: PRILOSEC Take 1 capsule by mouth once daily   Venlafaxine HCl 225 MG Tb24 Take 1 tablet by mouth once daily        DISCHARGE INSTRUCTIONS:   Follow-up PCP 5 days Follow-up Dr. Lady Gary 1 week.  If you experience worsening of  your admission symptoms, develop shortness of breath, life threatening emergency, suicidal or homicidal thoughts you must seek medical attention immediately by calling 911 or calling your MD immediately  if symptoms less severe.  You Must read complete instructions/literature along with all the possible adverse reactions/side effects for all the Medicines you take and that have been prescribed to you. Take any new Medicines after you have completely understood and accept all the possible adverse reactions/side effects.   Please note  You were cared for by a hospitalist during your hospital stay. If you have any questions about your discharge medications or the care you received while you were in the hospital after you are discharged, you can call the unit and asked to speak with the hospitalist on call if the hospitalist that took care of you is not available. Once  you are discharged, your primary care physician will handle any further medical issues. Please note that NO REFILLS for any discharge medications will be authorized once you are discharged, as it is imperative that you return to your primary care physician (or establish a relationship with a primary care physician if you do not have one) for your aftercare needs so that they can reassess your need for medications and monitor your lab values.    Today   CHIEF COMPLAINT:   Chief Complaint  Patient presents with  . Shortness of Breath    HISTORY OF PRESENT ILLNESS:  Fabianna Keats  is a 84 y.o. female came in with shortness of breath   VITAL SIGNS:  Blood pressure (!) 145/66, pulse (!) 58, temperature (!) 97.5 F (36.4 C), temperature source Oral, resp. rate 18, height 5\' 6"  (1.676 m), weight 75.5 kg, SpO2 98 %.  I/O:    Intake/Output Summary (Last 24 hours) at 04/03/2020 1654 Last data filed at 04/03/2020 1300 Gross per 24 hour  Intake 126.88 ml  Output 1250 ml  Net -1123.12 ml    PHYSICAL EXAMINATION:  GENERAL:  84  y.o.-year-old patient lying in the bed with no acute distress.  EYES: Pupils equal, round, reactive to light and accommodation. No scleral icterus. HEENT: Head atraumatic, normocephalic. Oropharynx and nasopharynx clear.  LUNGS: Normal breath sounds bilaterally, no wheezing, rales,rhonchi or crepitation. No use of accessory muscles of respiration.  CARDIOVASCULAR: S1, S2 normal. No murmurs, rubs, or gallops.  ABDOMEN: Soft, non-tender, non-distended. EXTREMITIES: No pedal edema.  NEUROLOGIC: Cranial nerves II through XII are intact. Muscle strength 5/5 in all extremities. Sensation intact. Gait not checked.  PSYCHIATRIC: The patient is alert and oriented x 3.  SKIN: No obvious rash, lesion, or ulcer.   DATA REVIEW:   CBC Recent Labs  Lab 04/03/20 0445  WBC 10.8*  HGB 10.0*  HCT 30.3*  PLT 220    Chemistries  Recent Labs  Lab 04/02/20 0056 04/02/20 0056 04/02/20 0322 04/03/20 0445  NA 136   < >  --  139  K 3.7   < >  --  3.5  CL 103   < >  --  101  CO2 21*   < >  --  26  GLUCOSE 192*   < >  --  205*  BUN 19   < >  --  32*  CREATININE 0.99   < >  --  1.06*  CALCIUM 8.6*   < >  --  8.9  MG 12.0*   < > 1.7  --   AST 24  --   --   --   ALT 13  --   --   --   ALKPHOS 86  --   --   --   BILITOT 0.5  --   --   --    < > = values in this interval not displayed.     Microbiology Results  Results for orders placed or performed during the hospital encounter of 04/02/20  Blood culture (routine x 2)     Status: None (Preliminary result)   Collection Time: 04/02/20 12:56 AM   Specimen: BLOOD  Result Value Ref Range Status   Specimen Description BLOOD BLOOD RIGHT FOREARM  Final   Special Requests   Final    BOTTLES DRAWN AEROBIC AND ANAEROBIC Blood Culture adequate volume   Culture   Final    NO GROWTH 1 DAY Performed at [facility],  [address] [place], [place] [address]    Report Status PENDING  Incomplete  Blood culture (routine x 2)     Status: None  (Preliminary result)   Collection Time: 04/02/20 12:56 AM   Specimen: BLOOD  Result Value Ref Range Status   Specimen Description BLOOD BLOOD LEFT WRIST  Final   Special Requests   Final    BOTTLES DRAWN AEROBIC AND ANAEROBIC Blood Culture adequate volume   Culture   Final    NO GROWTH 1 DAY Performed at [facility], [address] [place], [place] [address]    Report Status PENDING  Incomplete  Resp Panel by RT-PCR (Flu A&B, Covid) Nasopharyngeal Swab     Status: None   Collection Time: 04/02/20 12:56 AM   Specimen: Nasopharyngeal Swab; Nasopharyngeal(NP) swabs in vial transport medium  Result Value Ref Range Status   SARS Coronavirus 2 by RT PCR NEGATIVE NEGATIVE Final    Comment: (NOTE) SARS-CoV-2 target nucleic acids are NOT DETECTED.  The SARS-CoV-2 RNA is generally detectable in upper respiratory specimens during the acute phase of infection. The lowest concentration of SARS-CoV-2 viral copies this assay can detect is 138 copies/mL. A negative result does not preclude SARS-Cov-2 infection and should not be used as the sole basis for treatment or other patient management decisions. A negative result may occur with  improper specimen collection/handling, submission of specimen other than nasopharyngeal swab, presence of viral mutation(s) within the areas targeted by this assay, and inadequate number of viral copies(<138 copies/mL). A negative result must be combined with clinical observations, patient history, and epidemiological information. The expected result is Negative.  Fact Sheet for Patients:  BloggerCourse.comhttps://www.fda.gov/media/152166/download  Fact Sheet for Healthcare Providers:  SeriousBroker.ithttps://www.fda.gov/media/152162/download  This test is no t yet approved or cleared by the [place] FDA and  has been authorized for detection and/or diagnosis of SARS-CoV-2 by FDA under an Emergency Use Authorization (EUA). This EUA will remain  in effect (meaning this test  can be used) for the duration of the COVID-19 declaration under Section 564(b)(1) of the Act, 21 U.S.C.section 360bbb-3(b)(1), unless the authorization is terminated  or revoked sooner.       Influenza A by PCR NEGATIVE NEGATIVE Final   Influenza B by PCR NEGATIVE NEGATIVE Final    Comment: (NOTE) The Xpert Xpress SARS-CoV-2/FLU/RSV plus assay is intended as an aid in the diagnosis of influenza from Nasopharyngeal swab specimens and should not be used as a sole basis for treatment. Nasal washings and aspirates are unacceptable for Xpert Xpress SARS-CoV-2/FLU/RSV testing.  Fact Sheet for Patients: BloggerCourse.comhttps://www.fda.gov/media/152166/download  Fact Sheet for Healthcare Providers: SeriousBroker.ithttps://www.fda.gov/media/152162/download  This test is not yet approved or cleared by the [place] FDA and has been authorized for detection and/or diagnosis of SARS-CoV-2 by FDA under an Emergency Use Authorization (EUA). This EUA will remain in effect (meaning this test can be used) for the duration of the COVID-19 declaration under Section 564(b)(1) of the Act, 21 U.S.C. section 360bbb-3(b)(1), unless the authorization is terminated or revoked.  Performed at [facility], [address] [place], [place] [address]   MRSA PCR Screening     Status: None   Collection Time: 04/02/20  3:55 AM   Specimen: Nasopharyngeal  Result Value Ref Range Status   MRSA by PCR NEGATIVE NEGATIVE Final    Comment:        The GeneXpert MRSA Assay (FDA approved for NASAL specimens only), is one component of a comprehensive MRSA colonization surveillance program.  It is not intended to diagnose MRSA infection nor to guide or monitor treatment for MRSA infections. Performed at [facility], [address] [place], [place] [address]     RADIOLOGY:  CARDIAC CATHETERIZATION  Result Date: 04/03/2020  Prox RCA to Mid RCA lesion is 25% stenosed.  Prox LAD lesion is 50% stenosed.  Prox LAD  to Mid LAD lesion is 10% stenosed.  Widely patent mid RCA stent  Widely patent mid LAD stent  Preserved left ventricular function EF of 55%  2nd Diag lesion is 75% stenosed.  TIMI-3 flow in all vessels  Conclusion Diagnostic cardiac catheter right radial approach Preserved left ventricular function of 55% Large normal left main large LAD with a 50% proximal lesion widely patent mid LAD stent 75% ostial diagonal 1 Circumflex is large and free of disease RCA was large and had a 50% mid stent widely patent right dominant system Intervention deferred FFR and IFR were both deferred Recommend aggressive medical therapy TR band applied   DG Chest Portable 1 View  Result Date: 04/02/2020 CLINICAL DATA:  Dyspnea EXAM: PORTABLE CHEST 1 VIEW COMPARISON:  01/05/2019 FINDINGS: The lungs are stable leak, symmetrically mildly hyperinflated in keeping with changes of underlying COPD. Interstitial prominence likely relates to underlying interstitial lung disease. No pneumothorax or pleural effusion. Cardiac size within normal limits. No acute bone abnormality. IMPRESSION: No active disease.  COPD. Electronically Signed   By: Helyn Numbers MD   On: 04/02/2020 01:20   ECHOCARDIOGRAM COMPLETE  Result Date: 04/02/2020    ECHOCARDIOGRAM REPORT   Patient Name:   ALLYSSIA SKLUZACEK [facility] Date of Exam: 04/02/2020 Medical Rec #:  [id number]            Height:       66.0 in Accession #:    [id number]           Weight:       165.3 lb Date of Birth:  05/12/1935           BSA:          1.844 m Patient Age:    83 years             BP:           167/83 mmHg Patient Gender: F                    HR:           91 bpm. Exam Location:  [facility] Procedure: 2D Echo, Cardiac Doppler and Color Doppler Indications:     Cardiiomyopathy-unspecified 425.9  History:         Patient has prior history of Echocardiogram examinations, most                  recent 02/20/2018. CAD, COPD, Arrythmias:Atrial Fibrillation;                  Risk Factors:Hypertension.   Sonographer:     Cristela Blue RDCS (AE) Referring Phys:  [id number] BRITTON L R[place]CHESTER Diagnosing Phys: Alwyn Pea MD IMPRESSIONS  1. Left ventricular ejection fraction, by estimation, is 60 to 65%. The left ventricle has normal function. The left ventricle has no regional wall motion abnormalities. Left ventricular diastolic parameters were normal.  2. Right ventricular systolic function is normal. The right ventricular size is normal.  3. The mitral valve is normal in structure. No evidence of mitral valve regurgitation.  4. The aortic valve is normal in structure. Aortic  valve regurgitation is not visualized. FINDINGS  Left Ventricle: Left ventricular ejection fraction, by estimation, is 60 to 65%. The left ventricle has normal function. The left ventricle has no regional wall motion abnormalities. The left ventricular internal cavity size was normal in size. There is  borderline left ventricular hypertrophy. Left ventricular diastolic parameters were normal. Right Ventricle: The right ventricular size is normal. No increase in right ventricular wall thickness. Right ventricular systolic function is normal. Left Atrium: Left atrial size was normal in size. Right Atrium: Right atrial size was normal in size. Pericardium: There is no evidence of pericardial effusion. Mitral Valve: The mitral valve is normal in structure. No evidence of mitral valve regurgitation. Tricuspid Valve: The tricuspid valve is normal in structure. Tricuspid valve regurgitation is not demonstrated. Aortic Valve: The aortic valve is normal in structure. Aortic valve regurgitation is not visualized. Aortic valve mean gradient measures 6.5 mmHg. Aortic valve peak gradient measures 10.5 mmHg. Aortic valve area, by VTI measures 2.66 cm. Pulmonic Valve: The pulmonic valve was normal in structure. Pulmonic valve regurgitation is not visualized. Aorta: The aortic arch was not well visualized. IAS/Shunts: No atrial level shunt detected by  color flow Doppler.  LEFT VENTRICLE PLAX 2D LVIDd:         4.83 cm     Diastology LVIDs:         3.44 cm     LV e' medial:    7.83 cm/s LV PW:         1.30 cm     LV E/e' medial:  13.2 LV IVS:        1.45 cm     LV e' lateral:   5.22 cm/s LVOT diam:     2.00 cm     LV E/e' lateral: 19.7 LV SV:         74 LV SV Index:   40 LVOT Area:     3.14 cm  LV Volumes (MOD) LV vol d, MOD A2C: 76.9 ml LV vol d, MOD A4C: 88.7 ml LV vol s, MOD A2C: 48.0 ml LV vol s, MOD A4C: 41.7 ml LV SV MOD A2C:     28.9 ml LV SV MOD A4C:     88.7 ml LV SV MOD BP:      40.8 ml RIGHT VENTRICLE RV Basal diam:  3.13 cm RV S prime:     14.10 cm/s TAPSE (M-mode): 3.8 cm LEFT ATRIUM             Index       RIGHT ATRIUM           Index LA diam:        3.50 cm 1.90 cm/m  RA Area:     15.60 cm LA Vol (A2C):   81.6 ml 44.24 ml/m RA Volume:   38.90 ml  21.09 ml/m LA Vol (A4C):   61.0 ml 33.07 ml/m LA Biplane Vol: 74.4 ml 40.34 ml/m  AORTIC VALVE                    PULMONIC VALVE AV Area (Vmax):    2.37 cm     PV Vmax:        0.72 m/s AV Area (Vmean):   2.31 cm     PV Peak grad:   2.1 mmHg AV Area (VTI):     2.66 cm     RVOT Peak grad: 8 mmHg AV Vmax:  162.00 cm/s AV Vmean:          115.000 cm/s AV VTI:            0.279 m AV Peak Grad:      10.5 mmHg AV Mean Grad:      6.5 mmHg LVOT Vmax:         122.00 cm/s LVOT Vmean:        84.400 cm/s LVOT VTI:          0.236 m LVOT/AV VTI ratio: 0.85  AORTA Ao Root diam: 3.00 cm MITRAL VALVE                TRICUSPID VALVE MV Area (PHT): 5.27 cm     TR Peak grad:   23.8 mmHg MV Decel Time: 144 msec     TR Vmax:        244.00 cm/s MV E velocity: 103.00 cm/s MV A velocity: 98.50 cm/s   SHUNTS MV E/A ratio:  1.05         Systemic VTI:  0.24 m                             Systemic Diam: 2.00 cm Dwayne Salome Arnt MD Electronically signed by Alwyn Pea MD Signature Date/Time: 04/02/2020/1:41:34 PM    Final     Management plans discussed with the patient, family and they are in agreement.  CODE  STATUS:     Code Status Orders  (From admission, onward)         Start     Ordered   04/02/20 0236  Do not attempt resuscitation (DNR)  Continuous       Question Answer Comment  In the event of cardiac or respiratory ARREST Do not call a "code blue"   In the event of cardiac or respiratory ARREST Do not perform Intubation, CPR, defibrillation or ACLS   In the event of cardiac or respiratory ARREST Use medication by any route, position, wound care, and other measures to relive pain and suffering. May use oxygen, suction and manual treatment of airway obstruction as needed for comfort.      04/02/20 0235        Code Status History    Date Active Date Inactive Code Status Order ID Comments User Context   01/06/2019 0601 01/06/2019 2027 DNR [id number]  Arnaldo Natal, MD Inpatient   02/19/2018 2113 02/22/2018 1520 DNR [id number]  Enedina Finner, MD Inpatient   07/29/2015 1141 08/01/2015 1401 Full Code [id number]  Katha Hamming, MD ED   Advance Care Planning Activity      TOTAL TIME TAKING CARE OF THIS PATIENT: 39 minutes.    Alford Highland M.D on 04/03/2020 at 4:54 PM  Between 7am to 6pm - Pager - [phone]  After 6pm go to [email or web address] - password [email or web address]  [facility]  CC: Primary care physician; Erasmo Downer, MD

## 2020-04-03 NOTE — Progress Notes (Signed)
Pt denies pain. VSS. BP elevated but seemed to resolve with BP cuff location change to LUE. Pt ambulated around nurses station with stand by assist and tolerated well. Pulses and sensation intact to extremities. TR band off, level zero, pressure dressing applied. Pt educated on after care and verbalizes understanding. A&OX4 but forgetful. Sees things that are not at times but knows they may not be there and this is due to poor eye site and a glare in her vision per the pt. Per pt she has care givers at home. Family called to transport pt home. Will be wheeled to medical mall and assisted into private vehicle.

## 2020-04-06 ENCOUNTER — Telehealth: Payer: Self-pay

## 2020-04-06 NOTE — Telephone Encounter (Signed)
Transition Care Management Unsuccessful Follow-up Telephone Call  Date of discharge and from where:  [facility] on 04/03/20.  Attempts:  2nd Attempt  Reason for unsuccessful TCM follow-up call:  Unable to leave message

## 2020-04-06 NOTE — Telephone Encounter (Signed)
Transition Care Management Unsuccessful Follow-up Telephone Call  Date of discharge and from where:  [facility] on 04/03/20.  Attempts:  1st Attempt  Reason for unsuccessful TCM follow-up call:  No answer/busy

## 2020-04-06 NOTE — Telephone Encounter (Signed)
No HFU scheduled at this time. 

## 2020-04-07 LAB — CULTURE, BLOOD (ROUTINE X 2)
Culture: NO GROWTH
Culture: NO GROWTH
Special Requests: ADEQUATE
Special Requests: ADEQUATE

## 2020-04-07 NOTE — Telephone Encounter (Signed)
Transition Care Management Unsuccessful Follow-up Telephone Call  Date of discharge and from where:  [facility] on 04/03/20.  Attempts:  3rd Attempt  Reason for unsuccessful TCM follow-up call:  Unable to leave message    No HFU scheduled at this time. FYI!

## 2020-04-10 ENCOUNTER — Other Ambulatory Visit: Payer: Self-pay | Admitting: Family Medicine

## 2020-04-10 ENCOUNTER — Telehealth: Payer: Self-pay

## 2020-04-10 DIAGNOSIS — I10 Essential (primary) hypertension: Secondary | ICD-10-CM

## 2020-04-10 NOTE — Telephone Encounter (Signed)
Copied from CRM [id number]. Topic: Appointment Scheduling - Scheduling Inquiry for Clinic >> Apr 09, 2020  4:34 PM Daphine Deutscher D wrote: Reason for CRM: pt called asking for a HFU.  The first appt is Jan and I made that appt and put her on the wait list but she needs to come in before that.  CB#  [phone]

## 2020-04-13 NOTE — Telephone Encounter (Signed)
NA. Voicemail not set up.  

## 2020-04-13 NOTE — Telephone Encounter (Signed)
I'm sure that someone else has some availability before January

## 2020-04-14 NOTE — Telephone Encounter (Signed)
NA. Voicemail not set up.  

## 2020-04-15 NOTE — Telephone Encounter (Signed)
Patient reports that she is out of town and will keep appointment with cardiology on Friday. Patient refused to schedule another appointment here at [facility] to be seen before 05/04/2020.

## 2020-04-17 ENCOUNTER — Telehealth: Payer: Self-pay

## 2020-04-17 NOTE — Telephone Encounter (Signed)
Copied from CRM [id number]. Topic: General - Other >> Apr 17, 2020  2:36 PM Randol Kern wrote: Reason for CRM: [phone], Pt accidentally missed her appt today with Dr. Renard Hamper (cardiology) and wants help getting rescheduled for as soon as possible. Pt is parked outside of the building and does not want to go in to his office because the "girls at the front are going to tell her she has to wait two months"

## 2020-04-20 NOTE — Telephone Encounter (Signed)
Patient advised to call cardio to reschedule.

## 2020-05-04 ENCOUNTER — Encounter: Payer: Self-pay | Admitting: Family Medicine

## 2020-05-04 ENCOUNTER — Telehealth (INDEPENDENT_AMBULATORY_CARE_PROVIDER_SITE_OTHER): Payer: Medicare HMO | Admitting: Family Medicine

## 2020-05-04 VITALS — Wt 155.0 lb

## 2020-05-04 DIAGNOSIS — R918 Other nonspecific abnormal finding of lung field: Secondary | ICD-10-CM | POA: Diagnosis not present

## 2020-05-04 DIAGNOSIS — K219 Gastro-esophageal reflux disease without esophagitis: Secondary | ICD-10-CM

## 2020-05-04 DIAGNOSIS — G629 Polyneuropathy, unspecified: Secondary | ICD-10-CM | POA: Diagnosis not present

## 2020-05-04 DIAGNOSIS — J441 Chronic obstructive pulmonary disease with (acute) exacerbation: Secondary | ICD-10-CM

## 2020-05-04 DIAGNOSIS — Z720 Tobacco use: Secondary | ICD-10-CM

## 2020-05-04 DIAGNOSIS — I10 Essential (primary) hypertension: Secondary | ICD-10-CM

## 2020-05-04 DIAGNOSIS — E039 Hypothyroidism, unspecified: Secondary | ICD-10-CM | POA: Diagnosis not present

## 2020-05-04 MED ORDER — AMLODIPINE BESYLATE 5 MG PO TABS
5.0000 mg | ORAL_TABLET | Freq: Every day | ORAL | 1 refills | Status: DC
Start: 2020-05-04 — End: 2021-01-10

## 2020-05-04 MED ORDER — ISOSORBIDE MONONITRATE ER 30 MG PO TB24
30.0000 mg | ORAL_TABLET | Freq: Every day | ORAL | 3 refills | Status: DC
Start: 2020-05-04 — End: 2021-02-15

## 2020-05-04 MED ORDER — ALBUTEROL SULFATE HFA 108 (90 BASE) MCG/ACT IN AERS: 2.0000 | INHALATION_SPRAY | Freq: Four times a day (QID) | RESPIRATORY_TRACT | 1 refills | Status: AC | PRN

## 2020-05-04 MED ORDER — GABAPENTIN 600 MG PO TABS: 600.0000 mg | ORAL_TABLET | Freq: Three times a day (TID) | ORAL | 3 refills | Status: DC

## 2020-05-04 NOTE — Assessment & Plan Note (Signed)
Chronic and well cotrolled Continue PPI

## 2020-05-04 NOTE — Assessment & Plan Note (Signed)
Recently started on Amlodipine during hospitalization Advised to check blood pressures at home Continue current medication regimen

## 2020-05-04 NOTE — Progress Notes (Signed)
Virtual telephone visit    Virtual Visit via Telephone Note   This visit type was conducted due to national recommendations for restrictions regarding the COVID-19 Pandemic (e.g. social distancing) in an effort to limit this patient's exposure and mitigate transmission in our community. Due to her co-morbid illnesses, this patient is at least at moderate risk for complications without adequate follow up. This format is felt to be most appropriate for this patient at this time. The patient did not have access to video technology or had technical difficulties with video requiring transitioning to audio format only (telephone). Physical exam was limited to content and character of the telephone converstion.    Patient location: Home  Provider location: [facility]  I discussed the limitations of evaluation and management by telemedicine and the availability of in person appointments. The patient expressed understanding and agreed to proceed.   Visit Date: 05/04/2020  Today's healthcare provider: Shirlee Latch, MD   Chief Complaint  Patient presents with  . Hospitalization Follow-up   Subjective    HPI   Eileen Mack is an 85 y/o with a pmh of COPD, ACS, hyperlipidemia, and HTN who presents for a hospitalization follow up after an admission for treatment of a COPD exacerbation complicated with acute hypoxic respiratory failure and acute coronary syndrome. Upon discharge she continued her home medications and was started on breo-ellipta and amlodipine. In the last month she has been feeling 'okay'. Her joints and legs have been sore. She has had no shortness of breath change from baseline, no chest pain and no N/V/D. She is interested in acquiring home oxygen, and is concerned about prevention of future exacerbations.    Social History   Tobacco Use  . Smoking status: Current Every Day Smoker    Packs/day: 1.00    Years: 62.00    Pack years: 62.00    Types: Cigarettes   . Smokeless tobacco: Never Used  Vaping Use  . Vaping Use: Never used  Substance Use Topics  . Alcohol use: Not Currently  . Drug use: Never      Medications: Outpatient Medications Prior to Visit  Medication Sig  . acetaminophen (ACETAMINOPHEN 8 HOUR) 650 MG CR tablet Take 1,300 mg by mouth every 8 (eight) hours.  Marland Kitchen aspirin EC 81 MG tablet Take 81 mg by mouth daily.  Marland Kitchen atorvastatin (LIPITOR) 40 MG tablet Take 1 tablet (40 mg total) by mouth daily.  . carvedilol (COREG) 25 MG tablet TAKE 1 TABLET BY MOUTH TWICE DAILY WITH MEALS  . cloNIDine (CATAPRES) 0.2 MG tablet Take 1 tablet by mouth twice daily  . ferrous sulfate 325 (65 FE) MG tablet Take 1 tablet (325 mg total) by mouth daily with breakfast.  . fluticasone (FLONASE) 50 MCG/ACT nasal spray Place into the nose.  . fluticasone furoate-vilanterol (BREO ELLIPTA) 200-25 MCG/INH AEPB Inhale 1 puff into the lungs every morning.  Marland Kitchen gemfibrozil (LOPID) 600 MG tablet TAKE 1 TABLET BY MOUTH TWICE DAILY BEFORE A MEAL (Patient taking differently: Take 600 mg by mouth 2 (two) times daily before a meal. )  . levothyroxine (SYNTHROID) 112 MCG tablet Take 1 tablet (112 mcg total) by mouth daily.  . Multiple Vitamins-Minerals (MULTIVITAMIN GUMMIES ADULT PO) Take 1 each by mouth daily.  . nitroGLYCERIN (NITROSTAT) 0.4 MG SL tablet Place 0.4 mg under the tongue every 5 (five) minutes as needed for chest pain.  Marland Kitchen omeprazole (PRILOSEC) 20 MG capsule Take 1 capsule by mouth once daily (Patient taking differently: Take  20 mg by mouth daily. )  . Venlafaxine HCl 225 MG TB24 Take 1 tablet by mouth once daily (Patient taking differently: Take 225 mg by mouth daily. )  . [DISCONTINUED] albuterol (VENTOLIN HFA) 108 (90 Base) MCG/ACT inhaler Inhale 2 puffs into the lungs every 6 (six) hours as needed for wheezing or shortness of breath.  . [DISCONTINUED] amLODipine (NORVASC) 5 MG tablet Take 1 tablet (5 mg total) by mouth daily.  . [DISCONTINUED] gabapentin  (NEURONTIN) 600 MG tablet Take 1 tablet (600 mg total) by mouth 2 (two) times daily.  . [DISCONTINUED] isosorbide mononitrate (IMDUR) 30 MG 24 hr tablet Take 1 tablet (30 mg total) by mouth daily.   No facility-administered medications prior to visit.    Review of Systems  Constitutional: Negative.   HENT: Negative.   Eyes: Negative.   Respiratory: Negative.   Cardiovascular: Negative.   Gastrointestinal: Negative.   Endocrine: Negative.   Genitourinary: Negative.   Musculoskeletal: Positive for arthralgias and back pain.  Skin: Negative.   Neurological: Negative.   Hematological: Negative.   Psychiatric/Behavioral: Negative.       Objective    Wt 155 lb (70.3 kg)   BMI 25.02 kg/m    General: Well spoken in full sentences in NAD. Tangential speech.   Assessment & Plan     Problem List Items Addressed This Visit      Cardiovascular and Mediastinum   Hypertension    Recently started on Amlodipine during hospitalization Advised to check blood pressures at home Continue current medication regimen      Relevant Medications   amLODipine (NORVASC) 5 MG tablet   isosorbide mononitrate (IMDUR) 30 MG 24 hr tablet     Respiratory   COPD with acute exacerbation (HCC)    Chronic, s/p hospitalization 04/2020 No current exacerbation Recently added Breo-ellipta to regimen Albuterol prn for shortness of breath Home O2 sat monitoring Discussed that she does not curently need home O2 Encouraged tobacco cessation      Relevant Medications   albuterol (VENTOLIN HFA) 108 (90 Base) MCG/ACT inhaler     Digestive   GERD (gastroesophageal reflux disease)    Chronic and well cotrolled Continue PPI        Endocrine   Hypothyroidism    Previously well controlled Continue Synthroid at current dose Recheck TSH at next visit and ajust Synthroid as indicated        Nervous and Auditory   Neuropathy    Chronic Previously well controlled with gabapentin Has trouble with  30 day refills, prefers 90 day refills Increase gabapentin to TID dosing       Relevant Medications   gabapentin (NEURONTIN) 600 MG tablet     Other   Tobacco abuse    Patient remains precontemplative      Lung nodules - Primary    Repeat Chest CT for monitoring      Abnormal findings on diagnostic imaging of lung   Relevant Orders   CT Chest Wo Contrast       Return in about 3 months (around 08/02/2020) for chronic disease f/u.    I discussed the assessment and treatment plan with the patient. The patient was provided an opportunity to ask questions and all were answered. The patient agreed with the plan and demonstrated an understanding of the instructions.   The patient was advised to call back or seek an in-person evaluation if the symptoms worsen or if the condition fails to improve as anticipated.  I provided  36 minutes of non-face-to-face time during this encounter.  Patient seen along with MS3 student [facility]. I personally evaluated this patient along with the student, and verified all aspects of the history, physical exam, and medical decision making as documented by the student. I agree with the student's documentation and have made all necessary edits.  Jilene Spohr, Dionne Bucy, MD, MPH [facility]

## 2020-05-04 NOTE — Assessment & Plan Note (Addendum)
Chronic Previously well controlled with gabapentin Has trouble with 30 day refills, prefers 90 day refills Increase gabapentin to TID dosing

## 2020-05-04 NOTE — Assessment & Plan Note (Addendum)
Previously well controlled Continue Synthroid at current dose Recheck TSH at next visit and ajust Synthroid as indicated

## 2020-05-04 NOTE — Assessment & Plan Note (Addendum)
Chronic, s/p hospitalization 04/2020 No current exacerbation Recently added Breo-ellipta to regimen Albuterol prn for shortness of breath Home O2 sat monitoring Discussed that she does not curently need home O2 Encouraged tobacco cessation

## 2020-05-05 NOTE — Assessment & Plan Note (Signed)
Repeat Chest CT for monitoring

## 2020-05-05 NOTE — Assessment & Plan Note (Signed)
Patient remains precontemplative 

## 2020-05-13 ENCOUNTER — Telehealth: Payer: Self-pay | Admitting: Family Medicine

## 2020-05-13 NOTE — Telephone Encounter (Signed)
Dr Edwin Cap Bommiredy from [facility] will be calling you at 11:45 to speak to you for peer to peer.in order to get CT approved Case is set to close today. She said it will show up either an 800 number or [phone].

## 2020-05-14 NOTE — Telephone Encounter (Signed)
Called and spoke to Clio at the 1 [phone]. She reports that case is now closed and considered non-certified. We now have to fax supporting documentation of need for CT scan. She reports that scan was denied due to size not being over a certain number. Fax # [phone] [employer] phone number for questions [phone]. Case # [id number]

## 2020-05-14 NOTE — Telephone Encounter (Signed)
We can fax last CT scan report and last OV note.

## 2020-05-14 NOTE — Telephone Encounter (Signed)
I did not see this until today. I have not spoken to them. Can we reopen a case?

## 2020-05-14 NOTE — Telephone Encounter (Signed)
Copied from CRM [id number]. Topic: Quick Communication - See Telephone Encounter >> May 13, 2020 11:56 AM Aretta Nip wrote: CRM for notification. See Telephone encounter for: 05/13/20. FYI call office/no answer/ a call came in from [place], stating they had an appt with Dr B today at 11:45 re: peer to peer conversation?  Since after 11:55 now apt was 11:45  when they said appt was sch, and Marylene Land is not on sch I took the message as she wanted to resch the appt  Call Back for resc can be done at [phone]

## 2020-05-15 NOTE — Telephone Encounter (Signed)
Office visit and CT scan faxed. [phone]

## 2020-05-19 ENCOUNTER — Other Ambulatory Visit: Payer: Self-pay | Admitting: Family Medicine

## 2020-07-15 ENCOUNTER — Other Ambulatory Visit: Payer: Self-pay | Admitting: Family Medicine

## 2020-07-15 NOTE — Telephone Encounter (Signed)
Requested medication (s) are due for refill today: yes  Requested medication (s) are on the active medication list: yes  Last refill:  05/19/2020  Future visit scheduled: yes  Notes to clinic:  Patient has appointment on 08/07/2020 Review for refill until appt  Protocol failed for refill   Requested Prescriptions  Pending Prescriptions Disp Refills   levothyroxine (SYNTHROID) 112 MCG tablet [Pharmacy Med Name: Levothyroxine Sodium 112 MCG Oral Tablet] 90 tablet 0    Sig: Take 1 tablet by mouth once daily      Endocrinology:  Hypothyroid Agents Failed - 07/15/2020  1:09 PM      Failed - TSH needs to be rechecked within 3 months after an abnormal result. Refill until TSH is due.      Failed - TSH in normal range and within 360 days    TSH  Date Value Ref Range Status  02/12/2020 5.190 (H) 0.450 - 4.500 uIU/mL Final          Passed - Valid encounter within last 12 months    Recent Outpatient Visits           2 months ago Lung nodules   [facility] [place], Marzella Schlein, MD   5 months ago Acquired hypothyroidism   [facility] [place], Marzella Schlein, MD   10 months ago Depression, prolonged   [facility], Marzella Schlein, MD   11 months ago Encounter for annual wellness visit (AWV) in Medicare patient   [facility] [place], Marzella Schlein, MD   1 year ago Chronic right shoulder pain   [facility] [place], Marzella Schlein, MD       Future Appointments             In 3 weeks Bacigalupo, Marzella Schlein, MD [facility]               atorvastatin (LIPITOR) 40 MG tablet [Pharmacy Med Name: Atorvastatin Calcium 40 MG Oral Tablet] 90 tablet 0    Sig: Take 1 tablet by mouth once daily      Cardiovascular:  Antilipid - Statins Failed - 07/15/2020  1:09 PM      Failed - LDL in normal range and within 360 days    LDL Chol Calc (NIH)  Date Value Ref Range Status  02/12/2020 96 0 - 99 mg/dL  Final          Passed - Total Cholesterol in normal range and within 360 days    Cholesterol, Total  Date Value Ref Range Status  02/12/2020 168 100 - 199 mg/dL Final          Passed - HDL in normal range and within 360 days    HDL  Date Value Ref Range Status  02/12/2020 62 >39 mg/dL Final          Passed - Triglycerides in normal range and within 360 days    Triglycerides  Date Value Ref Range Status  02/12/2020 46 0 - 149 mg/dL Final          Passed - Patient is not pregnant      Passed - Valid encounter within last 12 months    Recent Outpatient Visits           2 months ago Lung nodules   [facility] [place], Marzella Schlein, MD   5 months ago Acquired hypothyroidism   [facility] [place], Marzella Schlein, MD   10 months ago Depression, prolonged  [facility] [place], Marzella Schlein, MD   11 months ago Encounter for annual wellness visit (AWV) in Medicare patient   [facility] [place], Marzella Schlein, MD   1 year ago Chronic right shoulder pain   [facility] [place], Marzella Schlein, MD       Future Appointments             In 3 weeks Bacigalupo, Marzella Schlein, MD [facility], [facility]               carvedilol (COREG) 25 MG tablet [Pharmacy Med Name: Carvedilol 25 MG Oral Tablet] 180 tablet 0    Sig: TAKE 1 TABLET BY MOUTH TWICE DAILY WITH MEALS      Cardiovascular:  Beta Blockers Failed - 07/15/2020  1:09 PM      Failed - Last BP in normal range    BP Readings from Last 1 Encounters:  04/03/20 (!) 145/66          Passed - Last Heart Rate in normal range    Pulse Readings from Last 1 Encounters:  04/03/20 (!) 58          Passed - Valid encounter within last 6 months    Recent Outpatient Visits           2 months ago Lung nodules   [facility] [place], Marzella Schlein, MD   5 months ago Acquired hypothyroidism   [facility], Marzella Schlein, MD    10 months ago Depression, prolonged   [facility], Marzella Schlein, MD   11 months ago Encounter for annual wellness visit (AWV) in Medicare patient   [facility], Marzella Schlein, MD   1 year ago Chronic right shoulder pain   [facility] [place], Marzella Schlein, MD       Future Appointments             In 3 weeks Bacigalupo, Marzella Schlein, MD [facility]               cloNIDine (CATAPRES) 0.2 MG tablet [Pharmacy Med Name: cloNIDine HCl 0.2 MG Oral Tablet] 180 tablet 0    Sig: Take 1 tablet by mouth twice daily      Cardiovascular:  Alpha-2 Agonists Failed - 07/15/2020  1:09 PM      Failed - Last BP in normal range    BP Readings from Last 1 Encounters:  04/03/20 (!) 145/66          Passed - Last Heart Rate in normal range    Pulse Readings from Last 1 Encounters:  04/03/20 (!) 58          Passed - Valid encounter within last 6 months    Recent Outpatient Visits           2 months ago Lung nodules   [facility] [place], Marzella Schlein, MD   5 months ago Acquired hypothyroidism   [facility] [place], Marzella Schlein, MD   10 months ago Depression, prolonged   [facility], Marzella Schlein, MD   11 months ago Encounter for annual wellness visit (AWV) in Medicare patient   [facility] [place], Marzella Schlein, MD   1 year ago Chronic right shoulder pain   [facility] [place], Marzella Schlein, MD       Future Appointments             In 3 weeks Bacigalupo,  Marzella Schlein, MD [facility], [facility]               Venlafaxine HCl 225 MG TB24 [Pharmacy Med Name: Venlafaxine HCl ER 225 MG Oral Tablet Extended Release 24 Hour] 90 tablet 0    Sig: Take 1 tablet by mouth once daily      Psychiatry: Antidepressants - SNRI - desvenlafaxine & venlafaxine Failed - 07/15/2020  1:09 PM      Failed - LDL in normal range and within 360  days    LDL Chol Calc (NIH)  Date Value Ref Range Status  02/12/2020 96 0 - 99 mg/dL Final          Failed - Last BP in normal range    BP Readings from Last 1 Encounters:  04/03/20 (!) 145/66          Passed - Total Cholesterol in normal range and within 360 days    Cholesterol, Total  Date Value Ref Range Status  02/12/2020 168 100 - 199 mg/dL Final          Passed - Triglycerides in normal range and within 360 days    Triglycerides  Date Value Ref Range Status  02/12/2020 46 0 - 149 mg/dL Final          Passed - Completed PHQ-2 or PHQ-9 in the last 360 days      Passed - Valid encounter within last 6 months    Recent Outpatient Visits           2 months ago Lung nodules   [facility] [place], Marzella Schlein, MD   5 months ago Acquired hypothyroidism   [facility] [place], Marzella Schlein, MD   10 months ago Depression, prolonged   [facility], Marzella Schlein, MD   11 months ago Encounter for annual wellness visit (AWV) in Medicare patient   [facility] [place], Marzella Schlein, MD   1 year ago Chronic right shoulder pain   [facility] [place], Marzella Schlein, MD       Future Appointments             In 3 weeks Bacigalupo, Marzella Schlein, MD [facility], [facility]

## 2020-07-27 ENCOUNTER — Ambulatory Visit: Payer: Self-pay | Admitting: *Deleted

## 2020-07-27 NOTE — Telephone Encounter (Signed)
Per inititial encounter, " Patient has noticed an increase in swelling in their feet for roughly a month; The swelling has been slow but continuous ; Patient has seen no decrease in the swelling; Patient has attempted to soak their feet as well as elevate them; Patient would like to be contacted to discuss further; (Patient would prefer to be contacted in the afternoon, after they've eaten lunch)"; 3rd attempt to contact pt; message states voicemail has not been set up; will route to office for final disposition.  Reason for Disposition . RN needs further essential information from caller in order to complete triage  Protocols used: INFORMATION ONLY CALL - NO TRIAGE-A-AH

## 2020-07-27 NOTE — Telephone Encounter (Signed)
  Reason for Disposition . Unexplained mild or moderate swelling of one ankle or foot  (Exception: swelling is itchy)  Protocols used: LEG OR FOOT SWELLING-P-AH

## 2020-07-27 NOTE — Telephone Encounter (Signed)
  Reason for Disposition . [1] MILD swelling of both ankles (i.e., pedal edema) AND [2] new-onset or worsening  Protocols used: LEG SWELLING AND EDEMA-A-AH

## 2020-07-29 NOTE — Telephone Encounter (Signed)
Patient was seen at Cardio and was advised to D/C Amlodipine. Patient advised to call back in two weeks if swelling does not improve.

## 2020-08-03 ENCOUNTER — Telehealth: Payer: Self-pay | Admitting: Family Medicine

## 2020-08-03 NOTE — Telephone Encounter (Signed)
Copied from CRM [id number]. Topic: Medicare AWV >> Aug 03, 2020  2:05 PM Geraldine Contras wrote:   Reason for CRM  AWVS scheduled August 19, 2020 has been cancelled.  We will contact patient back to reschedule when schedule opens.

## 2020-08-07 ENCOUNTER — Encounter: Payer: Self-pay | Admitting: Family Medicine

## 2020-09-15 ENCOUNTER — Telehealth: Payer: Self-pay | Admitting: Family Medicine
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
## 2020-09-16 NOTE — Telephone Encounter (Signed)
Patient needs appt to go over forms. They are extensive.

## 2020-09-16 NOTE — Telephone Encounter (Signed)
Pt has appt on 09/18/2020 at 1120.  She states that she only needs one page filled out and it was put I Dr. Senaida Lange box

## 2020-09-16 NOTE — Telephone Encounter (Signed)
Please review?  Does Pt need an appointment for this?  Thanks,   -Vernona Rieger

## 2020-09-16 NOTE — Telephone Encounter (Signed)
Tried calling; Pt's voicemail is not set up.  PEC please schedule an appointment with any available provider.    Thanks,   -Vernona Rieger

## 2020-09-17 NOTE — Telephone Encounter (Signed)
Typically all of the pages need to be completed, but I guess we will see her tomorrow

## 2020-09-17 NOTE — Telephone Encounter (Signed)
FYI see below. KW °

## 2020-09-18 ENCOUNTER — Other Ambulatory Visit: Payer: Self-pay

## 2020-09-18 ENCOUNTER — Encounter: Payer: Self-pay | Admitting: Family Medicine

## 2020-09-18 ENCOUNTER — Ambulatory Visit (INDEPENDENT_AMBULATORY_CARE_PROVIDER_SITE_OTHER): Payer: Medicare HMO | Admitting: Family Medicine

## 2020-09-18 VITALS — BP 133/66 | HR 64 | Temp 99.7°F | Resp 16 | Wt 164.7 lb

## 2020-09-18 DIAGNOSIS — J42 Unspecified chronic bronchitis: Secondary | ICD-10-CM | POA: Diagnosis not present

## 2020-09-18 DIAGNOSIS — F32A Depression, unspecified: Secondary | ICD-10-CM | POA: Diagnosis not present
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
## 2020-09-22 ENCOUNTER — Telehealth: Payer: Self-pay

## 2020-09-22 NOTE — Telephone Encounter (Signed)
Copied from CRM [id number]. Topic: Quick Communication - Office Called Patient (Clinic Use ONLY) >> Sep 22, 2020 10:39 AM Leafy Ro wrote: Reason for CRM: Pt had an appt with dr b on 09-18-2020 and she thinks the office may have tried to call her

## 2020-09-22 NOTE — Telephone Encounter (Signed)
I don't see where anyone here has tried to call Ms Drost.   Thanks,   -Vernona Rieger

## 2020-09-24 ENCOUNTER — Other Ambulatory Visit: Payer: Self-pay | Admitting: *Deleted

## 2020-09-24 MED ORDER — NITROGLYCERIN 0.4 MG SL SUBL
0.4000 mg | SUBLINGUAL_TABLET | SUBLINGUAL | 1 refills | Status: AC | PRN
Start: 2020-09-24 — End: ?

## 2020-09-24 NOTE — Telephone Encounter (Signed)
Please review request and advise number of quantity and refill for medication. KW

## 2020-09-24 NOTE — Telephone Encounter (Signed)
Copied from CRM [id number]. Topic: General - Call Back - No Documentation >> Sep 24, 2020  1:31 PM Randol Kern wrote: Reason for CRM: Pt has lost her heart medication, her nitroglycerin, she needs it asap. Requesting immediate assistance because she needs them today.   [facility] [address], [place] - [address] [place] [address] Phone: [phone] Fax: [phone]

## 2020-09-30 ENCOUNTER — Telehealth: Payer: Self-pay

## 2020-09-30 NOTE — Telephone Encounter (Signed)
Copied from CRM [id number]. Topic: General - Other >> Sep 30, 2020  9:35 AM Marylen Ponto wrote: Reason for CRM: Pt stated 2 pages of the paperwork was not completed so she will be bringing those pages in for Dr. B to complete and fax back. Pt stated she will bring the papers in today or tomorrow.

## 2020-10-01 NOTE — Telephone Encounter (Signed)
I warned her at time of visit that additional forms would need to be completed. Will keep an eye out for them.

## 2020-10-02 NOTE — Telephone Encounter (Signed)
Completed forms faxed. TNP

## 2020-10-16 ENCOUNTER — Other Ambulatory Visit: Payer: Self-pay | Admitting: Family Medicine

## 2020-10-16 NOTE — Telephone Encounter (Signed)
Pt called saying she is out of several of these meds so if she could get them refilled asap she would appreciate that.

## 2020-11-03 ENCOUNTER — Other Ambulatory Visit: Payer: Self-pay | Admitting: Family Medicine

## 2020-11-03 NOTE — Telephone Encounter (Signed)
   Notes to clinic:  Review for refill Rob Bunting is only 3 tabs   Requested Prescriptions  Pending Prescriptions Disp Refills   Venlafaxine HCl 225 MG TB24 [Pharmacy Med Name: Venlafaxine HCl ER 225 MG Oral Tablet Extended Release 24 Hour] 3 tablet 0    Sig: Take 1 tablet by mouth once daily      Psychiatry: Antidepressants - SNRI - desvenlafaxine & venlafaxine Passed - 11/03/2020 11:20 AM      Passed - LDL in normal range and within 360 days    LDL Chol Calc (NIH)  Date Value Ref Range Status  02/12/2020 96 0 - 99 mg/dL Final          Passed - Total Cholesterol in normal range and within 360 days    Cholesterol, Total  Date Value Ref Range Status  02/12/2020 168 100 - 199 mg/dL Final          Passed - Triglycerides in normal range and within 360 days    Triglycerides  Date Value Ref Range Status  02/12/2020 46 0 - 149 mg/dL Final          Passed - Completed PHQ-2 or PHQ-9 in the last 360 days      Passed - Last BP in normal range    BP Readings from Last 1 Encounters:  09/18/20 133/66          Passed - Valid encounter within last 6 months    Recent Outpatient Visits           1 month ago Chronic bronchitis, unspecified chronic bronchitis type [facility])   [facility] [place], Marzella Schlein, MD   6 months ago Lung nodules   [facility], Marzella Schlein, MD   9 months ago Acquired hypothyroidism   [facility], Marzella Schlein, MD   1 year ago Depression, prolonged   [facility] [place], Marzella Schlein, MD   1 year ago Encounter for annual wellness visit (AWV) in Medicare patient   [facility], Marzella Schlein, MD

## 2020-11-03 NOTE — Telephone Encounter (Signed)
  Notes to clinic:  Review for refill Script is different from whats on chart    Requested Prescriptions  Pending Prescriptions Disp Refills   EUTHYROX 112 MCG tablet [Pharmacy Med Name: Euthyrox 112 MCG Oral Tablet] 90 tablet 0    Sig: Take 1 tablet by mouth once daily      Endocrinology:  Hypothyroid Agents Failed - 11/03/2020  9:50 AM      Failed - TSH needs to be rechecked within 3 months after an abnormal result. Refill until TSH is due.      Failed - TSH in normal range and within 360 days    TSH  Date Value Ref Range Status  02/12/2020 5.190 (H) 0.450 - 4.500 uIU/mL Final          Passed - Valid encounter within last 12 months    Recent Outpatient Visits           1 month ago Chronic bronchitis, unspecified chronic bronchitis type [facility])   [facility] [place], Marzella Schlein, MD   6 months ago Lung nodules   [facility] [place], Marzella Schlein, MD   9 months ago Acquired hypothyroidism   [facility], Marzella Schlein, MD   1 year ago Depression, prolonged   [facility], Marzella Schlein, MD   1 year ago Encounter for annual wellness visit (AWV) in Medicare patient   [facility], Marzella Schlein, MD

## 2020-11-11 ENCOUNTER — Other Ambulatory Visit: Payer: Self-pay | Admitting: Family Medicine

## 2020-11-11 NOTE — Telephone Encounter (Signed)
Notes to clinic:  Verify qty for refill Only shows for 3 tabs on each  Requested Prescriptions  Pending Prescriptions Disp Refills   omeprazole (PRILOSEC) 20 MG capsule [Pharmacy Med Name: Omeprazole 20 MG Oral Capsule Delayed Release] 3 capsule 0    Sig: Take 1 capsule by mouth once daily      Gastroenterology: Proton Pump Inhibitors Passed - 11/11/2020  5:46 AM      Passed - Valid encounter within last 12 months    Recent Outpatient Visits           1 month ago Chronic bronchitis, unspecified chronic bronchitis type [facility])   [facility] [place], Marzella Schlein, MD   6 months ago Lung nodules   [facility] [place], Marzella Schlein, MD   9 months ago Acquired hypothyroidism   [facility], Marzella Schlein, MD   1 year ago Depression, prolonged   [facility], Marzella Schlein, MD   1 year ago Encounter for annual wellness visit (AWV) in Medicare patient   [facility] [place], Marzella Schlein, MD                  atorvastatin (LIPITOR) 40 MG tablet [Pharmacy Med Name: Atorvastatin Calcium 40 MG Oral Tablet] 3 tablet 0    Sig: Take 1 tablet by mouth once daily      Cardiovascular:  Antilipid - Statins Passed - 11/11/2020  5:46 AM      Passed - Total Cholesterol in normal range and within 360 days    Cholesterol, Total  Date Value Ref Range Status  02/12/2020 168 100 - 199 mg/dL Final          Passed - LDL in normal range and within 360 days    LDL Chol Calc (NIH)  Date Value Ref Range Status  02/12/2020 96 0 - 99 mg/dL Final          Passed - HDL in normal range and within 360 days    HDL  Date Value Ref Range Status  02/12/2020 62 >39 mg/dL Final          Passed - Triglycerides in normal range and within 360 days    Triglycerides  Date Value Ref Range Status  02/12/2020 46 0 - 149 mg/dL Final          Passed - Patient is not pregnant      Passed - Valid encounter within last 12  months    Recent Outpatient Visits           1 month ago Chronic bronchitis, unspecified chronic bronchitis type [facility])   [facility] [place], Marzella Schlein, MD   6 months ago Lung nodules   [facility], Marzella Schlein, MD   9 months ago Acquired hypothyroidism   [facility], Marzella Schlein, MD   1 year ago Depression, prolonged   [facility], Marzella Schlein, MD   1 year ago Encounter for annual wellness visit (AWV) in Medicare patient   [facility] [place], Marzella Schlein, MD                  EUTHYROX 112 MCG tablet [Pharmacy Med Name: Euthyrox 112 MCG Oral Tablet] 3 tablet 0    Sig: Take 1 tablet by mouth once daily      Endocrinology:  Hypothyroid Agents Failed - 11/11/2020  5:46 AM      Failed - TSH needs to  be rechecked within 3 months after an abnormal result. Refill until TSH is due.      Failed - TSH in normal range and within 360 days    TSH  Date Value Ref Range Status  02/12/2020 5.190 (H) 0.450 - 4.500 uIU/mL Final          Passed - Valid encounter within last 12 months    Recent Outpatient Visits           1 month ago Chronic bronchitis, unspecified chronic bronchitis type [facility])   [facility] [place], Marzella Schlein, MD   6 months ago Lung nodules   [facility] [place], Marzella Schlein, MD   9 months ago Acquired hypothyroidism   [facility], Marzella Schlein, MD   1 year ago Depression, prolonged   [facility], Marzella Schlein, MD   1 year ago Encounter for annual wellness visit (AWV) in Medicare patient   [facility], Marzella Schlein, MD

## 2021-01-10 ENCOUNTER — Other Ambulatory Visit: Payer: Self-pay | Admitting: Family Medicine

## 2021-01-10 NOTE — Telephone Encounter (Signed)
Requested Prescriptions  Pending Prescriptions Disp Refills  . cloNIDine (CATAPRES) 0.2 MG tablet [Pharmacy Med Name: cloNIDine HCl 0.2 MG Oral Tablet] 180 tablet 0    Sig: Take 1 tablet by mouth twice daily     Cardiovascular:  Alpha-2 Agonists Passed - 01/10/2021  7:45 AM      Passed - Last BP in normal range    BP Readings from Last 1 Encounters:  09/18/20 133/66         Passed - Last Heart Rate in normal range    Pulse Readings from Last 1 Encounters:  09/18/20 64         Passed - Valid encounter within last 6 months    Recent Outpatient Visits          3 months ago Chronic bronchitis, unspecified chronic bronchitis type [facility])   [facility] [place], Marzella Schlein, MD   8 months ago Lung nodules   [facility] [place], Marzella Schlein, MD   11 months ago Acquired hypothyroidism   [facility], Marzella Schlein, MD   1 year ago Depression, prolonged   [facility], Marzella Schlein, MD   1 year ago Encounter for annual wellness visit (AWV) in Medicare patient   [facility] [place], Marzella Schlein, MD             . amLODipine (NORVASC) 5 MG tablet [Pharmacy Med Name: amLODIPine Besylate 5 MG Oral Tablet] 30 tablet 0    Sig: Take 1 tablet by mouth once daily     Cardiovascular:  Calcium Channel Blockers Passed - 01/10/2021  7:45 AM      Passed - Last BP in normal range    BP Readings from Last 1 Encounters:  09/18/20 133/66         Passed - Valid encounter within last 6 months    Recent Outpatient Visits          3 months ago Chronic bronchitis, unspecified chronic bronchitis type [facility])   [facility], Marzella Schlein, MD   8 months ago Lung nodules   [facility] [place], Marzella Schlein, MD   11 months ago Acquired hypothyroidism   [facility], Marzella Schlein, MD   1 year ago Depression, prolonged   [facility], Marzella Schlein, MD   1 year ago Encounter for annual wellness visit (AWV) in Medicare patient   [facility] [place], Marzella Schlein, MD             . carvedilol (COREG) 25 MG tablet [Pharmacy Med Name: Carvedilol 25 MG Oral Tablet] 60 tablet 0    Sig: TAKE 1 TABLET BY MOUTH TWICE DAILY WITH MEALS     Cardiovascular:  Beta Blockers Passed - 01/10/2021  7:45 AM      Passed - Last BP in normal range    BP Readings from Last 1 Encounters:  09/18/20 133/66         Passed - Last Heart Rate in normal range    Pulse Readings from Last 1 Encounters:  09/18/20 64         Passed - Valid encounter within last 6 months    Recent Outpatient Visits          3 months ago Chronic bronchitis, unspecified chronic bronchitis type [facility])   [facility] [place], Marzella Schlein, MD   8 months ago Lung nodules   [facility] [place], Marzella Schlein, MD  11 months ago Acquired hypothyroidism   [facility] [place], Marzella Schlein, MD   1 year ago Depression, prolonged   [facility], Marzella Schlein, MD   1 year ago Encounter for annual wellness visit (AWV) in Medicare patient   [facility], Marzella Schlein, MD

## 2021-01-15 ENCOUNTER — Other Ambulatory Visit: Payer: Self-pay | Admitting: Family Medicine

## 2021-01-15 NOTE — Telephone Encounter (Signed)
Requested medication (s) are due for refill today:   Yes  Requested medication (s) are on the active medication list:   Yes  Future visit scheduled:   No  Seen 3 wks ago by Dr. Brita Romp   Last ordered: 01/11/2020 #180, 3 refills  Returned because protocol failed due to lab work being due.      Requested Prescriptions  Pending Prescriptions Disp Refills   gemfibrozil (LOPID) 600 MG tablet [Pharmacy Med Name: Gemfibrozil 600 MG Oral Tablet] 60 tablet 0    Sig: TAKE 1 TABLET BY MOUTH TWICE DAILY BEFORE A MEAL     Cardiovascular:  Antilipid - Fibric Acid Derivatives Failed - 01/15/2021  1:04 PM      Failed - ALT in normal range and within 180 days    ALT  Date Value Ref Range Status  04/02/2020 13 0 - 44 U/L Final          Failed - AST in normal range and within 180 days    AST  Date Value Ref Range Status  04/02/2020 24 15 - 41 U/L Final          Failed - Cr in normal range and within 180 days    Creatinine, Ser  Date Value Ref Range Status  04/03/2020 1.06 (H) 0.44 - 1.00 mg/dL Final          Failed - eGFR in normal range and within 180 days    GFR calc Af Amer  Date Value Ref Range Status  02/12/2020 57 (L) >59 mL/min/1.73 Final    Comment:    **In accordance with recommendations from the NKF-ASN Task force,**   [facility] is in the process of updating its eGFR calculation to the   2021 CKD-EPI creatinine equation that estimates kidney function   without a race variable.    GFR, Estimated  Date Value Ref Range Status  04/03/2020 53 (L) >60 mL/min Final    Comment:    (NOTE) Calculated using the CKD-EPI Creatinine Equation (2021)           Passed - Total Cholesterol in normal range and within 360 days    Cholesterol, Total  Date Value Ref Range Status  02/12/2020 168 100 - 199 mg/dL Final          Passed - LDL in normal range and within 360 days    LDL Chol Calc (NIH)  Date Value Ref Range Status  02/12/2020 96 0 - 99 mg/dL Final          Passed -  HDL in normal range and within 360 days    HDL  Date Value Ref Range Status  02/12/2020 62 >39 mg/dL Final          Passed - Triglycerides in normal range and within 360 days    Triglycerides  Date Value Ref Range Status  02/12/2020 46 0 - 149 mg/dL Final          Passed - Valid encounter within last 12 months    Recent Outpatient Visits           3 months ago Chronic bronchitis, unspecified chronic bronchitis type [facility])   [facility] [place], Dionne Bucy, MD   8 months ago Lung nodules   [place], Dionne Bucy, MD   11 months ago Acquired hypothyroidism   [facility], Dionne Bucy, MD   1 year ago Depression, prolonged   [facility], Dionne Bucy, MD  1 year ago Encounter for annual wellness visit (AWV) in Medicare patient   [facility] [place], Dionne Bucy, MD

## 2021-01-18 ENCOUNTER — Telehealth: Payer: Self-pay

## 2021-01-18 NOTE — Telephone Encounter (Signed)
Copied from CRM [id number]. Topic: Appointment Scheduling - Scheduling Inquiry for Clinic >> Jan 18, 2021  9:40 AM Traci Sermon wrote: Reason for CRM: Pt called in stating she needs an appt asap, she stated it was personal and did not want to give any details as to why she wanted to be seen, please advise.

## 2021-01-18 NOTE — Telephone Encounter (Signed)
Pt calling in again. She states that she just lost her last child and that she is needing help. She states that she only wants to speak with Dr. Ermalene Postin nurse assistance and she states that she would just like to have Dr. B call her. Please advise.

## 2021-01-27 NOTE — Telephone Encounter (Signed)
Attempted to reach back out to patient to see if she has spoken with a member of our staff and voicemail box is not set up. Please advise. KW

## 2021-01-27 NOTE — Telephone Encounter (Signed)
Pt returning your call.  Would like a call back please

## 2021-01-28 NOTE — Telephone Encounter (Signed)
Pt called in to scheduled an apt. Advised pt of next opening, pt says that she need to be seen sooner than January. Pt says that she would like to speak with provider about some emotional concerns that she has since losing her daughter. Pt says that she would also like to discuss her medications.   Please assist pt with sooner apt if possible.    Phone: [phone]

## 2021-01-28 NOTE — Telephone Encounter (Signed)
Attempted call. NA, Voicemail is not set up.  PEC please advise patient will need appt for proper treatment.

## 2021-02-02 ENCOUNTER — Ambulatory Visit (INDEPENDENT_AMBULATORY_CARE_PROVIDER_SITE_OTHER): Payer: Medicare HMO | Admitting: Family Medicine

## 2021-02-02 DIAGNOSIS — H9209 Otalgia, unspecified ear: Secondary | ICD-10-CM | POA: Diagnosis not present

## 2021-02-02 DIAGNOSIS — F32A Depression, unspecified: Secondary | ICD-10-CM | POA: Diagnosis not present

## 2021-02-02 DIAGNOSIS — R519 Headache, unspecified: Secondary | ICD-10-CM | POA: Diagnosis not present

## 2021-02-02 NOTE — Progress Notes (Signed)
MyChart Video Visit    Virtual Visit via Video Note   This visit type was conducted due to national recommendations for restrictions regarding the COVID-19 Pandemic (e.g. social distancing) in an effort to limit this patient's exposure and mitigate transmission in our community. This patient is at least at moderate risk for complications without adequate follow up. This format is felt to be most appropriate for this patient at this time. Physical exam was limited by quality of the video and audio technology used for the visit.   Patient location: home Provider location: [facility]  I discussed the limitations of evaluation and management by telemedicine and the availability of in person appointments. The patient expressed understanding and agreed to proceed.  Patient: Eileen Mack   DOB: 08-Nov-1935   85 y.o. Female  MRN: [id number] Visit Date: 02/02/2021  Today's healthcare provider: Mila Merry, MD   Chief Complaint  Patient presents with   Depression   Subjective    Depression        This is a chronic problem.  The problem occurs constantly.  The problem has been gradually worsening since onset.  Associated symptoms include decreased concentration and decreased interest.  Associated symptoms include no fatigue, no appetite change and no suicidal ideas.     The symptoms are aggravated by family issues (death of her child 1 month ago).    Depression screen [facility] 2/9 02/02/2021 01/30/2020 09/19/2019  Decreased Interest 2 1 0  Down, Depressed, Hopeless 1 0 1  PHQ - 2 Score 3 1 1   Altered sleeping 0 0 0  Tired, decreased energy 1 3 2   Change in appetite 1 3 0  Feeling bad or failure about yourself  0 1 0  Trouble concentrating 3 2 3   Moving slowly or fidgety/restless 0 0 0  Suicidal thoughts 0 0 0  PHQ-9 Score 8 10 6   Difficult doing work/chores Not difficult at all Not difficult at all Not difficult at all    She feels like the depression is getting better and doesn't feel  like she needs any change in her medications.  States she is having pains in her head and ears off and on for the last few months. Was seen at [facility] urgent care and prescribed cefdinir which made no difference at all. She reports she has younger sister recently diagnosed with brain cancer. She is concerned and would like to have head CT.    Medications: Outpatient Medications Prior to Visit  Medication Sig   albuterol (VENTOLIN HFA) 108 (90 Base) MCG/ACT inhaler Inhale 2 puffs into the lungs every 6 (six) hours as needed for wheezing or shortness of breath.   amLODipine (NORVASC) 5 MG tablet Take 1 tablet by mouth once daily   aspirin EC 81 MG tablet Take 81 mg by mouth daily.   atorvastatin (LIPITOR) 40 MG tablet Take 1 tablet (40 mg total) by mouth daily.   carvedilol (COREG) 25 MG tablet TAKE 1 TABLET BY MOUTH TWICE DAILY WITH MEALS   cloNIDine (CATAPRES) 0.2 MG tablet Take 1 tablet by mouth twice daily   ferrous sulfate 325 (65 FE) MG tablet Take 1 tablet (325 mg total) by mouth daily with breakfast.   fluticasone furoate-vilanterol (BREO ELLIPTA) 200-25 MCG/INH AEPB Inhale 1 puff into the lungs every morning.   gabapentin (NEURONTIN) 600 MG tablet Take 1 tablet (600 mg total) by mouth 3 (three) times daily.   gemfibrozil (LOPID) 600 MG tablet TAKE 1 TABLET BY MOUTH TWICE  DAILY BEFORE A MEAL   isosorbide mononitrate (IMDUR) 30 MG 24 hr tablet Take 1 tablet (30 mg total) by mouth daily.   levothyroxine (EUTHYROX) 112 MCG tablet Take 1 tablet (112 mcg total) by mouth daily.   Multiple Vitamins-Minerals (MULTIVITAMIN GUMMIES ADULT PO) Take 1 each by mouth daily.   nitroGLYCERIN (NITROSTAT) 0.4 MG SL tablet Place 1 tablet (0.4 mg total) under the tongue every 5 (five) minutes as needed for chest pain.   omeprazole (PRILOSEC) 20 MG capsule Take 1 capsule (20 mg total) by mouth daily.   Venlafaxine HCl 225 MG TB24 Take 1 tablet by mouth once daily   acetaminophen (TYLENOL) 650 MG CR tablet Take  1,300 mg by mouth every 8 (eight) hours. (Patient not taking: Reported on 02/02/2021)   fluticasone (FLONASE) 50 MCG/ACT nasal spray Place into the nose. (Patient not taking: Reported on 02/02/2021)   No facility-administered medications prior to visit.    Review of Systems  Constitutional:  Negative for appetite change, chills, fatigue and fever.  Respiratory:  Negative for chest tightness and shortness of breath.   Cardiovascular:  Negative for chest pain and palpitations.  Gastrointestinal:  Negative for abdominal pain, nausea and vomiting.  Neurological:  Negative for dizziness and weakness.  Psychiatric/Behavioral:  Positive for decreased concentration, depression and dysphoric mood. Negative for suicidal ideas.      Objective    There were no vitals taken for this visit.   Physical Exam   Awake, alert, oriented x 3. In no apparent distress   Assessment & Plan     1. Depression, prolonged She feels her current medications are effective and wishes to continue unchanged.   2. Nonintractable headache, unspecified chronicity pattern, unspecified headache type   3. Ear ache  She is concerned about possible tumor as her sister recently found to have brain tumor.   MRI was ordered, but patient was seen ER for torticollis prior to MRI being scheduled and had CT of head and neck revealing mastoid effusions  No follow-ups on file.     I discussed the assessment and treatment plan with the patient. The patient was provided an opportunity to ask questions and all were answered. The patient agreed with the plan and demonstrated an understanding of the instructions.   The patient was advised to call back or seek an in-person evaluation if the symptoms worsen or if the condition fails to improve as anticipated.  I provided 12 minutes of non-face-to-face time during this encounter.  The entirety of the information documented in the History of Present Illness, Review of Systems and  Physical Exam were personally obtained by me. Portions of this information were initially documented by the CMA and reviewed by me for thoroughness and accuracy.    Mila Merry, MD [facility] [phone] (phone) [phone] (fax)  [facility]

## 2021-02-05 ENCOUNTER — Telehealth: Payer: Self-pay

## 2021-02-05 NOTE — Telephone Encounter (Signed)
Pt was just checking on her referral for a MRI.  I advised pt it was still pending and we would call her once it was schedule.     Thanks,   -Vernona Rieger

## 2021-02-05 NOTE — Telephone Encounter (Signed)
Copied from CRM [id number]. Topic: General - Other >> Feb 05, 2021  9:40 AM Jaquita Rector A wrote: Reason for CRM: Patient called in to inquire of Dr Sherrie Mustache about something that was to be done post virtual visit that was had on 02/02/21. Would like a call back please at Ph# [phone]

## 2021-02-09 ENCOUNTER — Emergency Department: Payer: Medicare HMO

## 2021-02-09 ENCOUNTER — Emergency Department
Admission: EM | Admit: 2021-02-09 | Discharge: 2021-02-09 | Disposition: A | Discharge status: Home / Self Care | Payer: Medicare HMO | Attending: Emergency Medicine | Admitting: Emergency Medicine

## 2021-02-09 ENCOUNTER — Other Ambulatory Visit: Payer: Self-pay

## 2021-02-09 DIAGNOSIS — Z7982 Long term (current) use of aspirin: Secondary | ICD-10-CM | POA: Diagnosis not present

## 2021-02-09 DIAGNOSIS — I4891 Unspecified atrial fibrillation: Secondary | ICD-10-CM | POA: Insufficient documentation

## 2021-02-09 DIAGNOSIS — J441 Chronic obstructive pulmonary disease with (acute) exacerbation: Secondary | ICD-10-CM | POA: Insufficient documentation

## 2021-02-09 DIAGNOSIS — I10 Essential (primary) hypertension: Secondary | ICD-10-CM | POA: Insufficient documentation

## 2021-02-09 DIAGNOSIS — I25119 Atherosclerotic heart disease of native coronary artery with unspecified angina pectoris: Secondary | ICD-10-CM | POA: Insufficient documentation

## 2021-02-09 DIAGNOSIS — M436 Torticollis: Secondary | ICD-10-CM | POA: Diagnosis not present

## 2021-02-09 DIAGNOSIS — R519 Headache, unspecified: Secondary | ICD-10-CM | POA: Diagnosis not present

## 2021-02-09 DIAGNOSIS — Z7951 Long term (current) use of inhaled steroids: Secondary | ICD-10-CM | POA: Diagnosis not present

## 2021-02-09 DIAGNOSIS — M62838 Other muscle spasm: Secondary | ICD-10-CM

## 2021-02-09 DIAGNOSIS — Z955 Presence of coronary angioplasty implant and graft: Secondary | ICD-10-CM | POA: Insufficient documentation

## 2021-02-09 DIAGNOSIS — F17210 Nicotine dependence, cigarettes, uncomplicated: Secondary | ICD-10-CM | POA: Diagnosis not present

## 2021-02-09 DIAGNOSIS — M62830 Muscle spasm of back: Secondary | ICD-10-CM | POA: Diagnosis not present

## 2021-02-09 DIAGNOSIS — J45909 Unspecified asthma, uncomplicated: Secondary | ICD-10-CM | POA: Insufficient documentation

## 2021-02-09 DIAGNOSIS — M542 Cervicalgia: Secondary | ICD-10-CM | POA: Insufficient documentation

## 2021-02-09 DIAGNOSIS — Z79899 Other long term (current) drug therapy: Secondary | ICD-10-CM | POA: Diagnosis not present

## 2021-02-09 LAB — CBC
HCT: 28.7 % — ABNORMAL LOW (ref 36.0–46.0)
Hemoglobin: 9.9 g/dL — ABNORMAL LOW (ref 12.0–15.0)
MCH: 31.4 pg (ref 26.0–34.0)
MCHC: 34.5 g/dL (ref 30.0–36.0)
MCV: 91.1 fL (ref 80.0–100.0)
Platelets: 226 10*3/uL (ref 150–400)
RBC: 3.15 MIL/uL — ABNORMAL LOW (ref 3.87–5.11)
RDW: 13.4 % (ref 11.5–15.5)
WBC: 5.3 10*3/uL (ref 4.0–10.5)
nRBC: 0 % (ref 0.0–0.2)

## 2021-02-09 LAB — BASIC METABOLIC PANEL
Anion gap: 13 (ref 5–15)
BUN: 20 mg/dL (ref 8–23)
CO2: 27 mmol/L (ref 22–32)
Calcium: 7.7 mg/dL — ABNORMAL LOW (ref 8.9–10.3)
Chloride: 98 mmol/L (ref 98–111)
Creatinine, Ser: 0.97 mg/dL (ref 0.44–1.00)
GFR, Estimated: 58 mL/min — ABNORMAL LOW (ref >60)
Glucose, Bld: 192 mg/dL — ABNORMAL HIGH (ref 70–99)
Potassium: 3.5 mmol/L (ref 3.5–5.1)
Sodium: 138 mmol/L (ref 135–145)

## 2021-02-09 MED ORDER — LIDOCAINE 5 % EX PTCH: 1.0000 | MEDICATED_PATCH | Freq: Two times a day (BID) | CUTANEOUS | 0 refills | Status: AC

## 2021-02-09 MED ORDER — LIDOCAINE 5 % EX PTCH
1.0000 | MEDICATED_PATCH | Freq: Once | CUTANEOUS | Status: DC
  Administered 2021-02-09: 1 via TRANSDERMAL
  Filled 2021-02-09: qty 1

## 2021-02-09 MED ORDER — LIDOCAINE HCL (PF) 1 % IJ SOLN
10.0000 mL | Freq: Once | INTRAMUSCULAR | Status: AC
  Administered 2021-02-09: 10 mL
  Filled 2021-02-09: qty 10

## 2021-02-09 MED ORDER — ACETAMINOPHEN 500 MG PO TABS
1000.0000 mg | ORAL_TABLET | Freq: Once | ORAL | Status: AC
  Administered 2021-02-09: 1000 mg via ORAL
  Filled 2021-02-09: qty 2

## 2021-02-09 MED ORDER — OXYCODONE-ACETAMINOPHEN 5-325 MG PO TABS
1.0000 | ORAL_TABLET | Freq: Once | ORAL | Status: AC
  Administered 2021-02-09: 1 via ORAL
  Filled 2021-02-09: qty 1

## 2021-02-09 NOTE — ED Provider Notes (Signed)
Emergency Medicine Provider Triage Evaluation Note  MARYSSA GIAMPIETRO , a 85 y.o. female  was evaluated in triage.  Pt complains of headache.  Review of Systems  Positive: Headache, neck pain, ear pain bilaterally Negative: No fever, chills, chest pain, shortness of breath, dizziness  Physical Exam  BP 109/65 (BP Location: Left Arm)   Pulse 69   Temp 98.3 F (36.8 C) (Oral)   Resp 18   Ht 5\' 7"  (1.702 m)   Wt 72.6 kg   SpO2 100%   BMI 25.06 kg/m  Gen:   Awake, no distress   Resp:  Normal effort  MSK:   Moves extremities without difficulty  Other:  Cranial nerves II through XII grossly intact  Medical Decision Making  Medically screening exam initiated at 11:45 AM.  Appropriate orders placed.  Charliee J Lenderman was informed that the remainder of the evaluation will be completed by another provider, this initial triage assessment does not replace that evaluation, and the importance of remaining in the ED until their evaluation is complete.  Imaging ordered, lab work ordered by nursing staff   Sherrie Mustache, PA-C 02/09/21 1146    04/11/21, MD 02/09/21 04/11/21

## 2021-02-09 NOTE — ED Notes (Signed)
Received a call from patient's grand daughter, who is listed on her contacts list. She states she dropped patient off at clinic visit this morning and is trying to figure out why patient is now in Emergency Department. When questioned, patient will not say why she came here after office visit. I asked if she would reach out to her grand daughter to speak with her.

## 2021-02-09 NOTE — ED Triage Notes (Addendum)
Pt via POV from home. Pt c/o neck pain and headache since Friday that has progressively gotten worse. Denies dizziness or lightheadedness. States she recently had fluid in both of her ears that were infected in September but since then has completed the treatment for it. Denies NVD. Denies head injury. Denies blood thinner use.  Pt is A&OX4 and NAD.

## 2021-02-09 NOTE — ED Notes (Signed)
Patient given discharge instructions, all questions answered. Patient in possession of all belongings, directed to the discharge area  

## 2021-02-09 NOTE — ED Provider Notes (Signed)
[facility] Emergency Department Provider Note ____________________________________________   Event Date/Time   First MD Initiated Contact with Patient 02/09/21 1413     (approximate)  I have reviewed the triage vital signs and the nursing notes.  HISTORY  Chief Complaint Headache and Neck Pain   HPI Eileen Mack is a 85 y.o. femalewho presents to the ED for evaluation of atraumatic neck and shoulder pain.   Chart review indicates HTN, HLD, CAD.   Patient presents to the ED for evaluation of subacute atraumatic pain to her left shoulder and neck.  She reports aching pain that started on her right shoulder, then progressed to her left shoulder and then escalated from her left shoulder up towards her left neck and occiput.  She denies any inciting trauma, injuries or falls.  She reports significant psychosocial stressors and trauma with her daughter recently passing.  She reports that "after she got over this" she thought she needed to get evaluated.  She has been using an OTC lidocaine cream with some improvement of her symptoms, but it keeps coming back.  Denies fevers, trauma, focal ear ache or ear discharge, vision changes, syncope, urinary retention or stool retention, saddle anesthesias.  Past Medical History:  Diagnosis Date   AAA (abdominal aortic aneurysm) (HCC)    Allergy    Anemia    Atrial fibrillation (HCC)    Blood transfusion without reported diagnosis    CAD (coronary artery disease)    s/p PCI to LAD & RCA   COPD (chronic obstructive pulmonary disease) (HCC)    Depression    Emphysema of lung (HCC)    GERD (gastroesophageal reflux disease)    History of blood clots    Hyperlipidemia    Hypertension    Personal history of tobacco use, presenting hazards to health 07/01/2015   Renal disorder    Thyroid disease     Patient Active Problem List   Diagnosis Date Noted   Lung nodules 05/04/2020   Abnormal findings on diagnostic  imaging of lung 05/04/2020   Hypertension 05/04/2020   ACS (acute coronary syndrome) (HCC)    Hallucinations    Acute respiratory failure (HCC) 04/02/2020   OA (osteoarthritis) 09/19/2019   History of recurrent ear infection 09/19/2019   Prediabetes 07/31/2019   Chronic right shoulder pain 02/26/2019   Senile purpura (HCC) 02/26/2019   Breast pain, left 02/26/2019   Renal mass 02/26/2019   COPD with acute exacerbation (HCC) 01/06/2019   AAA (abdominal aortic aneurysm) without rupture 10/10/2017   Varicose veins of both lower extremities with pain 10/10/2017   Claudication (HCC) 10/10/2017   Hyperlipemia, mixed 08/14/2017   Neuropathy 08/14/2017   Depression, prolonged 08/14/2017   Cervical disc disease 08/14/2017   Diarrhea 08/14/2017   Tobacco abuse 08/14/2017   Multiple pulmonary nodules 04/29/2016   Iron deficiency anemia due to chronic blood loss 07/29/2015   Tobacco dependence 07/01/2015   PUD (peptic ulcer disease) 06/18/2014   Bigeminy 09/03/2013   Onychomycosis 02/25/2013   Hallux valgus with bunions 01/14/2013   Hammertoe 01/14/2013   DDD (degenerative disc disease), lumbar 08/09/2012   Atherosclerosis of native coronary artery of native heart with stable angina pectoris (HCC) 08/03/2012   Hypothyroidism 07/02/2012   GERD (gastroesophageal reflux disease) 07/02/2012   Cervical spondylosis without myelopathy 07/21/2011   Sciatica 07/21/2011   Accelerated hypertension 11/26/2010   Paroxysmal atrial fibrillation (HCC) 11/26/2010    Past Surgical History:  Procedure Laterality Date   ABDOMINAL HYSTERECTOMY  APPENDECTOMY     CARDIAC SURGERY     CHOLECYSTECTOMY     COLONOSCOPY WITH PROPOFOL Bilateral 08/01/2015   Procedure: COLONOSCOPY WITH PROPOFOL;  Surgeon: Scot Jun, MD;  Location: [facility] ENDOSCOPY;  Service: Endoscopy;  Laterality: Bilateral;   ENDOVASCULAR REPAIR/STENT GRAFT N/A 02/21/2018   Procedure: ENDOVASCULAR REPAIR/STENT GRAFT;  Surgeon: Renford Dills, MD;  Location: [facility] INVASIVE CV LAB;  Service: Cardiovascular;  Laterality: N/A;   ESOPHAGOGASTRODUODENOSCOPY N/A 07/31/2015   Procedure: ESOPHAGOGASTRODUODENOSCOPY (EGD);  Surgeon: Scot Jun, MD;  Location: [facility] ENDOSCOPY;  Service: Endoscopy;  Laterality: N/A;   EYE SURGERY     LEFT HEART CATH AND CORONARY ANGIOGRAPHY N/A 04/03/2020   Procedure: LEFT HEART CATH AND CORONARY ANGIOGRAPHY possible PCI and stent;  Surgeon: Alwyn Pea, MD;  Location: [facility] INVASIVE CV LAB;  Service: Cardiovascular;  Laterality: N/A;    Prior to Admission medications   Medication Sig Start Date End Date Taking? Authorizing Provider  lidocaine (LIDODERM) 5 % Place 1 patch onto the skin every 12 (twelve) hours. Remove & Discard patch within 12 hours or as directed by MD 02/09/21 02/09/22 Yes Delton Prairie, MD  acetaminophen (TYLENOL) 650 MG CR tablet Take 1,300 mg by mouth every 8 (eight) hours. Patient not taking: Reported on 02/02/2021    [provider]  albuterol (VENTOLIN HFA) 108 (90 Base) MCG/ACT inhaler Inhale 2 puffs into the lungs every 6 (six) hours as needed for wheezing or shortness of breath. 05/04/20   Erasmo Downer, MD  amLODipine (NORVASC) 5 MG tablet Take 1 tablet by mouth once daily 01/10/21   Erasmo Downer, MD  aspirin EC 81 MG tablet Take 81 mg by mouth daily.    [provider]  atorvastatin (LIPITOR) 40 MG tablet Take 1 tablet (40 mg total) by mouth daily. 11/11/20   Erasmo Downer, MD  carvedilol (COREG) 25 MG tablet TAKE 1 TABLET BY MOUTH TWICE DAILY WITH MEALS 01/10/21   Bacigalupo, Marzella Schlein, MD  cloNIDine (CATAPRES) 0.2 MG tablet Take 1 tablet by mouth twice daily 01/10/21   Erasmo Downer, MD  ferrous sulfate 325 (65 FE) MG tablet Take 1 tablet (325 mg total) by mouth daily with breakfast. 08/01/15   Adrian Saran, MD  fluticasone (FLONASE) 50 MCG/ACT nasal spray Place into the nose. Patient not taking: Reported on 02/02/2021 08/15/19    [provider]  fluticasone furoate-vilanterol (BREO ELLIPTA) 200-25 MCG/INH AEPB Inhale 1 puff into the lungs every morning. 04/03/20   Alford Highland, MD  gabapentin (NEURONTIN) 600 MG tablet Take 1 tablet (600 mg total) by mouth 3 (three) times daily. 05/04/20   Erasmo Downer, MD  gemfibrozil (LOPID) 600 MG tablet TAKE 1 TABLET BY MOUTH TWICE DAILY BEFORE A MEAL 01/18/21   Merita Norton T, FNP  isosorbide mononitrate (IMDUR) 30 MG 24 hr tablet Take 1 tablet (30 mg total) by mouth daily. 05/04/20   Erasmo Downer, MD  levothyroxine (EUTHYROX) 112 MCG tablet Take 1 tablet (112 mcg total) by mouth daily. 11/11/20   Erasmo Downer, MD  Multiple Vitamins-Minerals (MULTIVITAMIN GUMMIES ADULT PO) Take 1 each by mouth daily.    [provider]  nitroGLYCERIN (NITROSTAT) 0.4 MG SL tablet Place 1 tablet (0.4 mg total) under the tongue every 5 (five) minutes as needed for chest pain. 09/24/20   Erasmo Downer, MD  omeprazole (PRILOSEC) 20 MG capsule Take 1 capsule (20 mg total) by mouth daily. 11/11/20   Shirlee Latch  M, MD  Venlafaxine HCl 225 MG TB24 Take 1 tablet by mouth once daily 11/06/20   Erasmo Downer, MD    Allergies Iodinated diagnostic agents and Sulfa antibiotics  Family History  Problem Relation Age of Onset   Heart attack Father 35   Brain cancer Sister    Lung cancer Sister    Breast cancer Neg Hx    Colon cancer Neg Hx     Social History Social History   Tobacco Use   Smoking status: Every Day    Packs/day: 1.00    Years: 62.00    Pack years: 62.00    Types: Cigarettes   Smokeless tobacco: Never  Vaping Use   Vaping Use: Never used  Substance Use Topics   Alcohol use: Not Currently   Drug use: Never    Review of Systems  Constitutional: No fever/chills Eyes: No visual changes. ENT: No sore throat. Cardiovascular: Denies chest pain. Respiratory: Denies shortness of breath. Gastrointestinal: No abdominal pain.  No  nausea, no vomiting.  No diarrhea.  No constipation. Genitourinary: Negative for dysuria. Musculoskeletal: Negative for back pain. Positive for atraumatic left shoulder and left neck pain. Skin: Negative for rash. Neurological: Negative for headaches, focal weakness or numbness.  ____________________________________________   PHYSICAL EXAM:  VITAL SIGNS: Vitals:   02/09/21 1120  BP: 109/65  Pulse: 69  Resp: 18  Temp: 98.3 F (36.8 C)  SpO2: 100%     Constitutional: Alert and oriented. Well appearing and in no acute distress.  Ambulatory independently around the room, seems to refuse to rotate her neck and prefers to turn her whole body to look towards the side at me. Eyes: Conjunctivae are normal. PERRL. EOMI. Head: Atraumatic. Bilateral mastoid processes are nontender without overlying erythema Clear TMs bilaterally Nose: No congestion/rhinnorhea. Mouth/Throat: Mucous membranes are moist.  Oropharynx non-erythematous. Neck: No stridor. No cervical spine tenderness to palpation. Cardiovascular: Normal rate, regular rhythm. Grossly normal heart sounds.  Good peripheral circulation. Respiratory: Normal respiratory effort.  No retractions. Lungs CTAB. Gastrointestinal: Soft , nondistended, nontender to palpation. No CVA tenderness. Musculoskeletal: No lower extremity tenderness nor edema.  No joint effusions. No signs of acute trauma. No midline spinal tenderness throughout the cervical, thoracic and lumbar spine. Point tenderness to palpation to her left trapezius musculature with palpable muscular cord associated with this.  Similarly, point tenderness to her left-sided paraspinal cervical musculature about halfway up from her trapezius to her occiput. No overlying skin changes to either of these sites. Neurologic:  Normal speech and language. No gross focal neurologic deficits are appreciated. No gait instability noted. Skin:  Skin is warm, dry and intact. No rash  noted. Psychiatric: Mood and affect are normal. Speech and behavior are normal.  ____________________________________________   LABS (all labs ordered are listed, but only abnormal results are displayed)  Labs Reviewed  CBC - Abnormal; Notable for the following components:      Result Value   RBC 3.15 (*)    Hemoglobin 9.9 (*)    HCT 28.7 (*)    All other components within normal limits  BASIC METABOLIC PANEL - Abnormal; Notable for the following components:   Glucose, Bld 192 (*)    Calcium 7.7 (*)    GFR, Estimated 58 (*)    All other components within normal limits   ____________________________________________  12 Lead EKG  Twelve-lead EKG reviewed by me with sinus rhythm, rate of 69 bpm.  Leftward axis.  Left bundle.  No evidence of acute  ischemia per Sgarbossa criteria. ____________________________________________  RADIOLOGY  ED MD interpretation: CT head reviewed by me with mastoid effusions  Official radiology report(s): CT HEAD WO CONTRAST ( )  Result Date: 02/09/2021 CLINICAL DATA:  Head trauma, moderate to severe.  Headache. EXAM: CT HEAD WITHOUT CONTRAST TECHNIQUE: Contiguous axial images were obtained from the base of the skull through the vertex without intravenous contrast. COMPARISON:  None. FINDINGS: Brain: There is atrophy and chronic small vessel disease changes. No acute intracranial abnormality. Specifically, no hemorrhage, hydrocephalus, mass lesion, acute infarction, or significant intracranial injury. Vascular: No hyperdense vessel or unexpected calcification. Skull: No acute calvarial abnormality. Sinuses/Orbits: Bilateral mastoid effusions. No air-fluid levels in the paranasal sinuses. Orbital soft tissues unremarkable. Other: None IMPRESSION: Atrophy, chronic microvascular disease. No acute intracranial abnormality. Bilateral mastoid effusions. Electronically Signed   By: Charlett Nose M.D.   On: 02/09/2021 12:53   CT Cervical Spine Wo  Contrast  Result Date: 02/09/2021 CLINICAL DATA:  Neck pain EXAM: CT CERVICAL SPINE WITHOUT CONTRAST TECHNIQUE: Multidetector CT imaging of the cervical spine was performed without intravenous contrast. Multiplanar CT image reconstructions were also generated. COMPARISON:  None. FINDINGS: Alignment: No subluxation Skull base and vertebrae: No acute fracture. No primary bone lesion or focal pathologic process. Soft tissues and spinal canal: No prevertebral fluid or swelling. No visible canal hematoma. Disc levels: Diffuse advanced degenerative facet disease bilaterally. Moderate to advanced degenerative disc disease most pronounced from C5-6 through C6-7. Upper chest: No acute findings.  Biapical scarring. Other: None IMPRESSION: Moderate to advanced degenerative disc and facet disease. No acute bony abnormality. Electronically Signed   By: Charlett Nose M.D.   On: 02/09/2021 12:55    ____________________________________________   PROCEDURES and INTERVENTIONS  Procedure(s) performed (including Critical Care):  Procedures  Trigger point injection After discussing risks versus benefits, patient is agreeable to proceed with trigger point injection for treatment of acute muscular spasm. Risks discussed include worsening pain, infection, neurovascular damage and pneumothorax.  Point of maximal tenderness was identified at left trapezius and left-sided paraspinal cervical musculature. Overlying skin was cleaned with alcohol swabs. 1% lidocaine without epinephrine was incrementally injected, after confirming negative drawback, into this musculature. Total of 4 milliliters injected. Well-tolerated without any apparent complications.    Medications  lidocaine (LIDODERM) 5 % 1 patch (has no administration in time range)  oxyCODONE-acetaminophen (PERCOCET/ROXICET) 5-325 MG per tablet 1 tablet (1 tablet Oral Given 02/09/21 1140)  lidocaine (PF) (XYLOCAINE) 1 % injection 10 mL (10 mLs Infiltration Given  02/09/21 1453)  acetaminophen (TYLENOL) tablet 1,000 mg (1,000 mg Oral Given 02/09/21 1452)    ____________________________________________   MDM / ED COURSE   85 year old woman presents to the ED with subacute pain to her left shoulder and neck, with evidence of muscular spasm as a likely source of her symptoms, and amenable to outpatient management.  Normal vitals and generally reassuring examination.  She is quite tender to her left trapezius musculature and left-sided paraspinal cervical musculature, with a palpable cord, but she otherwise looks well without evidence of systemic illness, neurologic deficits or trauma.  Blood work is benign evidence of acute derangements.  Imaging of her head and neck without evidence of cervical fracture or intracranial pathology.  Mastoid effusions are noted bilaterally, but she has no evidence of mastoiditis on examination and this is clinically unlikely.  Resolving symptoms with management of her muscular spasms.  We will discharge with lidocaine patches and with return precautions for the ED.  Clinical Course as of 02/09/21  2952  Tue Feb 09, 2021  1503 She report injection performed to left-sided trapezius and left-sided paraspinal cervical musculature.  4 cc plain lidocaine applied.  Well-tolerated. [DS]  1518 Reassessed.  Patient reports feeling little bit better.  She is up and ambulating around the room and has put her close back on.  She is requesting discharge as her granddaughter has arrived.  We discussed management at home and return precautions for the ED. [DS]    Clinical Course User Index [DS] Delton Prairie, MD    ____________________________________________   FINAL CLINICAL IMPRESSION(S) / ED DIAGNOSES  Final diagnoses:  Torticollis  Neck pain  Trapezius muscle spasm     ED Discharge Orders          Ordered    lidocaine (LIDODERM) 5 %  Every 12 hours        02/09/21 1518             Eileen Mack   Note:  This  document was prepared using Dragon voice recognition software and may include unintentional dictation errors.    Delton Prairie, MD 02/09/21 1520

## 2021-02-09 NOTE — Discharge Instructions (Signed)
Use Tylenol for pain and fevers.  Up to 1000 mg per dose, up to 4 times per day.  Do not take more than 4000 mg of Tylenol/acetaminophen within 24 hours..  Please use lidocaine patches and your site of pain.  Apply 1 patch at a time, leave on for 12 hours, then remove for 12 hours.  12 hours on, 12 hours off.  Do not apply more than 1 patch at a time.  

## 2021-02-15 ENCOUNTER — Other Ambulatory Visit: Payer: Self-pay | Admitting: Family Medicine

## 2021-02-15 NOTE — Telephone Encounter (Signed)
Requested medications are due for refill today some are  Requested medications are on the active medication list yes  Last visit seen 02/02/21 for psychiatric med, seen 09/18/20 for respiratory med, not seen for BP in over 12 months  Future visit scheduled no  Notes to clinic has had curtesy refill on Coreg and Norvasc, no upcoming appt scheduled. Too soon for Catapress, [facility] has refills, labs over 360 days for Lopid and Venlafaxine. Please assess.  Requested Prescriptions  Pending Prescriptions Disp Refills   carvedilol (COREG) 25 MG tablet 60 tablet 0    Sig: Take 1 tablet (25 mg total) by mouth 2 (two) times daily with a meal.     Cardiovascular:  Beta Blockers Passed - 02/15/2021  4:48 PM      Passed - Last BP in normal range    BP Readings from Last 1 Encounters:  02/09/21 109/65          Passed - Last Heart Rate in normal range    Pulse Readings from Last 1 Encounters:  02/09/21 69          Passed - Valid encounter within last 6 months    Recent Outpatient Visits           1 week ago Depression, prolonged   [facility] Birdie Sons, MD   5 months ago Chronic bronchitis, unspecified chronic bronchitis type [facility])   [facility], Dionne Bucy, MD   9 months ago Lung nodules   [facility] [place], Dionne Bucy, MD   1 year ago Acquired hypothyroidism   [facility] [place], Dionne Bucy, MD   1 year ago Depression, prolonged   [facility], Dionne Bucy, MD               cloNIDine (CATAPRES) 0.2 MG tablet 180 tablet 0    Sig: Take 1 tablet (0.2 mg total) by mouth 2 (two) times daily.     Cardiovascular:  Alpha-2 Agonists Passed - 02/15/2021  4:48 PM      Passed - Last BP in normal range    BP Readings from Last 1 Encounters:  02/09/21 109/65          Passed - Last Heart Rate in normal range    Pulse Readings from Last 1 Encounters:  02/09/21 69          Passed -  Valid encounter within last 6 months    Recent Outpatient Visits           1 week ago Depression, prolonged   [facility] Birdie Sons, MD   5 months ago Chronic bronchitis, unspecified chronic bronchitis type [facility])   [facility], Dionne Bu[facility], MD   9 months ago Lung nodules   [facility] [place], Dionne Bucy, MD   1 year ago Acquired hypothyroidism   [facility] [place], Dionne Bucy, MD   1 year ago Depression, prolonged   [facility], Dionne Bucy, MD               gemfibrozil (LOPID) 600 MG tablet 60 tablet 0    Sig: Take 1 tablet (600 mg total) by mouth 2 (two) times daily before a meal.     Cardiovascular:  Antilipid - Fibric Acid Derivatives Failed - 02/15/2021  4:48 PM      Failed - Total Cholesterol in normal range and within 360 days    Cholesterol, Total  Date Value Ref Range Status  02/12/2020 168 100 - 199 mg/dL Final          Failed - LDL in normal range and within 360 days    LDL Chol Calc (NIH)  Date Value Ref Range Status  02/12/2020 96 0 - 99 mg/dL Final          Failed - HDL in normal range and within 360 days    HDL  Date Value Ref Range Status  02/12/2020 62 >39 mg/dL Final          Failed - Triglycerides in normal range and within 360 days    Triglycerides  Date Value Ref Range Status  02/12/2020 46 0 - 149 mg/dL Final          Failed - ALT in normal range and within 180 days    ALT  Date Value Ref Range Status  04/02/2020 13 0 - 44 U/L Final          Failed - AST in normal range and within 180 days    AST  Date Value Ref Range Status  04/02/2020 24 15 - 41 U/L Final          Failed - eGFR in normal range and within 180 days    GFR calc Af Amer  Date Value Ref Range Status  02/12/2020 57 (L) >59 mL/min/1.73 Final    Comment:    **In accordance with recommendations from the NKF-ASN Task force,**   [facility] is in the process of  updating its eGFR calculation to the   2021 CKD-EPI creatinine equation that estimates kidney function   without a race variable.    GFR, Estimated  Date Value Ref Range Status  02/09/2021 58 (L) >60 mL/min Final    Comment:    (NOTE) Calculated using the CKD-EPI Creatinine Equation (2021)           Passed - Cr in normal range and within 180 days    Creatinine, Ser  Date Value Ref Range Status  02/09/2021 0.97 0.44 - 1.00 mg/dL Final          Passed - Valid encounter within last 12 months    Recent Outpatient Visits           1 week ago Depression, prolonged   [facility] Birdie Sons, MD   5 months ago Chronic bronchitis, unspecified chronic bronchitis type [facility])   [facility], Dionne Bucy, MD   9 months ago Lung nodules   [facility] [place], Dionne Bucy, MD   1 year ago Acquired hypothyroidism   [facility] [place], Dionne Bucy, MD   1 year ago Depression, prolonged   [facility], Dionne Bucy, MD               amLODipine (NORVASC) 5 MG tablet 30 tablet 0    Sig: Take 1 tablet (5 mg total) by mouth daily.     Cardiovascular:  Calcium Channel Blockers Passed - 02/15/2021  4:48 PM      Passed - Last BP in normal range    BP Readings from Last 1 Encounters:  02/09/21 109/65          Passed - Valid encounter within last 6 months    Recent Outpatient Visits           1 week ago Depression, prolonged   [place], [place]   5 months ago Chronic  bronchitis, unspecified chronic bronchitis type [facility])   [facility] [place], Dionne Bucy, MD   9 months ago Lung nodules   [facility] [address], Dionne Bucy, MD   1 year ago Acquired hypothyroidism   [facility], Dionne Bucy, MD   1 year ago Depression, prolonged   [facility], Dionne Bucy, MD                Venlafaxine HCl 225 MG TB24 90 tablet 1    Sig: Take 1 tablet (225 mg total) by mouth daily.     Psychiatry: Antidepressants - SNRI - desvenlafaxine & venlafaxine Failed - 02/15/2021  4:48 PM      Failed - LDL in normal range and within 360 days    LDL Chol Calc (NIH)  Date Value Ref Range Status  02/12/2020 96 0 - 99 mg/dL Final          Failed - Total Cholesterol in normal range and within 360 days    Cholesterol, Total  Date Value Ref Range Status  02/12/2020 168 100 - 199 mg/dL Final          Failed - Triglycerides in normal range and within 360 days    Triglycerides  Date Value Ref Range Status  02/12/2020 46 0 - 149 mg/dL Final          Passed - Completed PHQ-2 or PHQ-9 in the last 360 days      Passed - Last BP in normal range    BP Readings from Last 1 Encounters:  02/09/21 109/65          Passed - Valid encounter within last 6 months    Recent Outpatient Visits           1 week ago Depression, prolonged   [facility] Bird[facility] Sons, MD   5 months ago Chronic bronchitis, unspecified chronic bronchitis type [facility])   [facility], Dionne Bucy, MD   9 months ago Lung nodules   [facility] [place], Dionne Bucy, MD   1 year ago Acquired hypothyroidism   [place], Dionne Bucy, MD   1 year ago Depression, prolonged   [facility], Dionne Bucy, MD               omeprazole (PRILOSEC) 20 MG capsule 90 capsule 1    Sig: Take 1 capsule (20 mg total) by mouth daily.     Gastroenterology: Proton Pump Inhibitors Passed - 02/15/2021  4:48 PM      Passed - Valid encounter within last 12 months    Recent Outpatient Visits           1 week ago Depression, prolonged   [facility] Birdie Sons, MD   5 months ago Chronic bronchitis, unspecified chronic bronchitis type [facility])   [facility], Dionne Bucy, MD   9 months ago  Lung nodules   [facility] [place], Dionne Bucy, MD   1 year ago Acquired hypothyroidism   [facility] [place], Dionne Bucy, MD   1 year ago Depression, prolonged   [facility], Dionne Bucy, MD               isosorbide mononitrate (IMDUR) 30 MG 24 hr tablet 90 tablet 3    Sig: Take 1 tablet (30 mg total) by mouth daily.     Cardiovascular:  Nitrates Passed - 02/15/2021  4:48 PM      Passed - Last BP in normal range    BP Readings from Last 1 Encounters:  02/09/21 109/65          Passed - Last Heart Rate in normal range    Pulse Readings from Last 1 Encounters:  02/09/21 69          Passed - Valid encounter within last 12 months    Recent Outpatient Visits           1 week ago Depression, prolonged   [facility] Birdie Sons, MD   5 months ago Chronic bronchitis, unspecified chronic bronchitis type [facility])   [facility], Dionne Bucy, MD   9 months ago Lung nodules   [facility] [place], Dionne Bucy, MD   1 year ago Acquired hypothyroidism   [facility], Dionne Bucy, MD   1 year ago Depression, prolonged   [facility], Dionne Bucy, MD               fluticasone furoate-vilanterol (BREO ELLIPTA) 200-25 MCG/INH AEPB 60 each 0    Sig: Inhale 1 puff into the lungs every morning.     Pulmonology:  Combination Products Passed - 02/15/2021  4:48 PM      Passed - Valid encounter within last 12 months    Recent Outpatient Visits           1 week ago Depression, prolonged   [facility] Bird[facility], MD   5 months ago Chronic bronchitis, unspecified chronic bronchitis type [facility])   [facility], Dionne Bucy, MD   9 months ago Lung nodules   [facility] [place], Dionne Bucy, MD   1 year ago Acquired hypothyroidism   [facility] [place],  Dionne Bucy, MD   1 year ago Depression, prolonged   [facility], Dionne Bucy, MD

## 2021-02-15 NOTE — Telephone Encounter (Signed)
Medication Refill - Medication:  Carvedilol Clonidine Gemfibozil Amlodipine  Venlafaxine Omeprazole  Isosorbide Mononitrate  Fluticasone Furoate Vilanterol     Has the patient contacted their pharmacy? No. (Agent: If no, request that the patient contact the pharmacy for the refill.) (Agent: If yes, when and what did the pharmacy advise?) she stated that the pharmacy doesn't do that when I asked her if she has reached out to the pharmacy/ she said they usually have her call the pcp office   Preferred Pharmacy (with phone number or street name): [facility] [address], [place] - [address]  [address] [place] [address]  Phone:  [phone]  Fax:  [phone]  Has the patient been seen for an appointment in the last year OR does the patient have an upcoming appointment? Yes.    Agent: Please be advised that RX refills may take up to 3 business days. We ask that you follow-up with your pharmacy.

## 2021-02-16 MED ORDER — CARVEDILOL 25 MG PO TABS: 25.0000 mg | ORAL_TABLET | Freq: Two times a day (BID) | ORAL | 0 refills | Status: DC

## 2021-02-16 MED ORDER — OMEPRAZOLE 20 MG PO CPDR: 20.0000 mg | DELAYED_RELEASE_CAPSULE | Freq: Every day | ORAL | 1 refills | Status: DC

## 2021-02-16 MED ORDER — VENLAFAXINE HCL ER 225 MG PO TB24: 1.0000 | ORAL_TABLET | Freq: Every day | ORAL | 1 refills | Status: DC

## 2021-02-16 MED ORDER — GEMFIBROZIL 600 MG PO TABS: 600.0000 mg | ORAL_TABLET | Freq: Two times a day (BID) | ORAL | 0 refills | Status: DC

## 2021-02-16 MED ORDER — FLUTICASONE FUROATE-VILANTEROL 200-25 MCG/INH IN AEPB: 1.0000 | INHALATION_SPRAY | Freq: Every morning | RESPIRATORY_TRACT | 0 refills | Status: DC

## 2021-02-16 MED ORDER — ISOSORBIDE MONONITRATE ER 30 MG PO TB24: 30.0000 mg | ORAL_TABLET | Freq: Every day | ORAL | 3 refills | Status: DC

## 2021-02-16 MED ORDER — AMLODIPINE BESYLATE 5 MG PO TABS: 5.0000 mg | ORAL_TABLET | Freq: Every day | ORAL | 0 refills | Status: DC

## 2021-02-16 MED ORDER — CLONIDINE HCL 0.2 MG PO TABS: 0.2000 mg | ORAL_TABLET | Freq: Two times a day (BID) | ORAL | 0 refills | Status: DC

## 2021-03-05 ENCOUNTER — Other Ambulatory Visit: Payer: Self-pay | Admitting: Family Medicine

## 2021-03-15 ENCOUNTER — Telehealth: Payer: Self-pay | Admitting: Family Medicine

## 2021-03-15 NOTE — Telephone Encounter (Signed)
Copied from CRM [id number]. Topic: Medicare AWV >> Mar 15, 2021  2:40 PM Claudette Laws R wrote: Reason for CRM:  No answer unable to leave a  message for patient to call back and schedule Medicare Annual Wellness Visit (AWV) in office.   If not able to come in office, please offer to do virtually or by telephone.   Last AWV: 07/31/2019  Please schedule at anytime with [facility] Advisor.  If any questions, please contact me at [phone]

## 2021-03-16 ENCOUNTER — Other Ambulatory Visit: Payer: Self-pay | Admitting: Family Medicine

## 2021-03-16 NOTE — Telephone Encounter (Signed)
Requested medications are due for refill today yes  Requested medications are on the active medication list yes  Last refill 02/16/21  Last visit 09/01/20  Future visit scheduled NO  Notes to clinic failed protocol of labs more than 180 days, and 360 days ago, please assess. Requested Prescriptions  Pending Prescriptions Disp Refills   gemfibrozil (LOPID) 600 MG tablet [Pharmacy Med Name: Gemfibrozil 600 MG Oral Tablet] 60 tablet 0    Sig: TAKE 1 TABLET BY MOUTH TWICE DAILY BEFORE A MEAL     Cardiovascular:  Antilipid - Fibric Acid Derivatives Failed - 03/16/2021  3:04 AM      Failed - Total Cholesterol in normal range and within 360 days    Cholesterol, Total  Date Value Ref Range Status  02/12/2020 168 100 - 199 mg/dL Final          Failed - LDL in normal range and within 360 days    LDL Chol Calc (NIH)  Date Value Ref Range Status  02/12/2020 96 0 - 99 mg/dL Final          Failed - HDL in normal range and within 360 days    HDL  Date Value Ref Range Status  02/12/2020 62 >39 mg/dL Final          Failed - Triglycerides in normal range and within 360 days    Triglycerides  Date Value Ref Range Status  02/12/2020 46 0 - 149 mg/dL Final          Failed - ALT in normal range and within 180 days    ALT  Date Value Ref Range Status  04/02/2020 13 0 - 44 U/L Final          Failed - AST in normal range and within 180 days    AST  Date Value Ref Range Status  04/02/2020 24 15 - 41 U/L Final          Failed - eGFR in normal range and within 180 days    GFR calc Af Amer  Date Value Ref Range Status  02/12/2020 57 (L) >59 mL/min/1.73 Final    Comment:    **In accordance with recommendations from the NKF-ASN Task force,**   [facility] is in the process of updating its eGFR calculation to the   2021 CKD-EPI creatinine equation that estimates kidney function   without a race variable.    GFR, Estimated  Date Value Ref Range Status  02/09/2021 58 (L) >60 mL/min  Final    Comment:    (NOTE) Calculated using the CKD-EPI Creatinine Equation (2021)           Passed - Cr in normal range and within 180 days    Creatinine, Ser  Date Value Ref Range Status  02/09/2021 0.97 0.44 - 1.00 mg/dL Final          Passed - Valid encounter within last 12 months    Recent Outpatient Visits           1 month ago Depression, prolonged   [facility], [facility]   5 months ago Chronic bronchitis, unspecified chronic bronchitis type [facility])   [facility], Dionne Bucy, MD   10 months ago Lung nodules   [facility] [place], Dionne Bucy, MD   1 year ago Acquired hypothyroidism   [facility], Dionne Bucy, MD   1 year ago Depression, prolonged   [facility], Dionne Bucy, MD

## 2021-03-16 NOTE — Telephone Encounter (Signed)
Requested medication (s) are due for refill today:   Yes  Requested medication (s) are on the active medication list:   Yes  Future visit scheduled:   Not sure   Last ordered: 02/16/2021 #60, 0 refills   Returned because there isn't protocol information on this medication  Requested Prescriptions  Pending Prescriptions Disp Refills   BREO ELLIPTA 200-25 MCG/ACT AEPB [Pharmacy Med Name: Earlie Server 458-59 MCG/INH Inhalation Aerosol Powder Breath Activated] 60 each 0    Sig: INHALE 1 PUFF IN THE MORNING     There is no refill protocol information for this order

## 2021-03-30 ENCOUNTER — Ambulatory Visit: Payer: Self-pay | Admitting: *Deleted

## 2021-03-30 ENCOUNTER — Telehealth: Payer: Self-pay

## 2021-03-30 ENCOUNTER — Other Ambulatory Visit: Payer: Self-pay | Admitting: Family Medicine

## 2021-03-30 NOTE — Telephone Encounter (Signed)
I returned her call.   She had called in earlier requesting clinical advice but did not leave any information what it was about.  I let her know who I was and that I was returning her call.   At first she didn't say anything and I mentioned I had a message to call her and asked how I could help her.     She asked if she could call me back.   I said,   "That would be fine".     She said,  "It might be tomorrow but I'll get back with you later".     I said,  "ok".

## 2021-03-30 NOTE — Telephone Encounter (Signed)
Copied from CRM [id number]. Topic: Appointment Scheduling - Scheduling Inquiry for Clinic >> Mar 30, 2021  9:37 AM Fanny Bien wrote: Reason for CRM: Pt called and stated that she would like an appointment with Dr B for 68mo follow up. Please advise

## 2021-03-31 ENCOUNTER — Ambulatory Visit: Payer: Self-pay | Admitting: *Deleted

## 2021-03-31 NOTE — Telephone Encounter (Signed)
Requested Prescriptions  Pending Prescriptions Disp Refills  . carvedilol (COREG) 25 MG tablet [Pharmacy Med Name: Carvedilol 25 MG Oral Tablet] 60 tablet 0    Sig: TAKE 1 TABLET BY MOUTH TWICE DAILY WITH MEALS     Cardiovascular:  Beta Blockers Passed - 03/30/2021 10:20 AM      Passed - Last BP in normal range    BP Readings from Last 1 Encounters:  02/09/21 109/65         Passed - Last Heart Rate in normal range    Pulse Readings from Last 1 Encounters:  02/09/21 69         Passed - Valid encounter within last 6 months    Recent Outpatient Visits          1 month ago Depression, prolonged   [facility] Malva Limes, MD   6 months ago Chronic bronchitis, unspecified chronic bronchitis type [facility])   [facility], Marzella Schlein, MD   11 months ago Lung nodules   [facility] [place], Marzella Schlein, MD   1 year ago Acquired hypothyroidism   [facility], Marzella Schlein, MD   1 year ago Depression, prolonged   [facility], Marzella Schlein, MD

## 2021-03-31 NOTE — Telephone Encounter (Signed)
Opened chart to answer a question for the agent.   Did not need to speak with pt.    The office left message to schedule her an appt when she calls back in.   I let the agent know this.   There are no appts so sending it to the practice for scheduling.

## 2021-03-31 NOTE — Telephone Encounter (Signed)
Lmtcb to schedule appt. PEC please schedule when patient calls back.

## 2021-04-01 ENCOUNTER — Ambulatory Visit (INDEPENDENT_AMBULATORY_CARE_PROVIDER_SITE_OTHER): Payer: Medicare HMO | Admitting: Family Medicine

## 2021-04-01 ENCOUNTER — Other Ambulatory Visit: Payer: Self-pay

## 2021-04-01 ENCOUNTER — Encounter: Payer: Self-pay | Admitting: Family Medicine

## 2021-04-01 ENCOUNTER — Telehealth: Payer: Self-pay | Admitting: *Deleted

## 2021-04-01 VITALS — BP 165/76 | HR 85 | Temp 98.3°F | Resp 20 | Wt 163.0 lb

## 2021-04-01 DIAGNOSIS — D692 Other nonthrombocytopenic purpura: Secondary | ICD-10-CM

## 2021-04-01 DIAGNOSIS — J9601 Acute respiratory failure with hypoxia: Secondary | ICD-10-CM

## 2021-04-01 DIAGNOSIS — Z23 Encounter for immunization: Secondary | ICD-10-CM | POA: Diagnosis not present

## 2021-04-01 DIAGNOSIS — K219 Gastro-esophageal reflux disease without esophagitis: Secondary | ICD-10-CM

## 2021-04-01 DIAGNOSIS — F4321 Adjustment disorder with depressed mood: Secondary | ICD-10-CM

## 2021-04-01 DIAGNOSIS — I480 Paroxysmal atrial fibrillation: Secondary | ICD-10-CM

## 2021-04-01 DIAGNOSIS — G629 Polyneuropathy, unspecified: Secondary | ICD-10-CM

## 2021-04-01 DIAGNOSIS — F32A Depression, unspecified: Secondary | ICD-10-CM

## 2021-04-01 DIAGNOSIS — Z604 Social exclusion and rejection: Secondary | ICD-10-CM

## 2021-04-01 DIAGNOSIS — I25118 Atherosclerotic heart disease of native coronary artery with other forms of angina pectoris: Secondary | ICD-10-CM

## 2021-04-01 DIAGNOSIS — R41840 Attention and concentration deficit: Secondary | ICD-10-CM

## 2021-04-01 DIAGNOSIS — E039 Hypothyroidism, unspecified: Secondary | ICD-10-CM

## 2021-04-01 DIAGNOSIS — I714 Abdominal aortic aneurysm, without rupture, unspecified: Secondary | ICD-10-CM

## 2021-04-01 DIAGNOSIS — R630 Anorexia: Secondary | ICD-10-CM | POA: Diagnosis not present

## 2021-04-01 DIAGNOSIS — Z634 Disappearance and death of family member: Secondary | ICD-10-CM

## 2021-04-01 DIAGNOSIS — E785 Hyperlipidemia, unspecified: Secondary | ICD-10-CM | POA: Insufficient documentation

## 2021-04-01 DIAGNOSIS — I10 Essential (primary) hypertension: Secondary | ICD-10-CM

## 2021-04-01 MED ORDER — LEVOTHYROXINE SODIUM 112 MCG PO TABS: 112.0000 ug | ORAL_TABLET | Freq: Every day | ORAL | 3 refills | Status: AC

## 2021-04-01 MED ORDER — GABAPENTIN 600 MG PO TABS: 600.0000 mg | ORAL_TABLET | Freq: Three times a day (TID) | ORAL | 3 refills | Status: AC

## 2021-04-01 MED ORDER — ATORVASTATIN CALCIUM 40 MG PO TABS: 40.0000 mg | ORAL_TABLET | Freq: Every day | ORAL | 3 refills | Status: AC

## 2021-04-01 MED ORDER — CARVEDILOL 25 MG PO TABS
25.0000 mg | ORAL_TABLET | Freq: Two times a day (BID) | ORAL | 3 refills | Status: AC
Start: 2021-04-01 — End: ?

## 2021-04-01 MED ORDER — VENLAFAXINE HCL ER 225 MG PO TB24: 1.0000 | ORAL_TABLET | Freq: Every day | ORAL | 3 refills | Status: AC

## 2021-04-01 MED ORDER — OMEPRAZOLE 20 MG PO CPDR: 20.0000 mg | DELAYED_RELEASE_CAPSULE | Freq: Every day | ORAL | 3 refills | Status: DC

## 2021-04-01 MED ORDER — ISOSORBIDE MONONITRATE ER 30 MG PO TB24: 30.0000 mg | ORAL_TABLET | Freq: Every day | ORAL | 3 refills | Status: DC

## 2021-04-01 MED ORDER — GEMFIBROZIL 600 MG PO TABS: 600.0000 mg | ORAL_TABLET | Freq: Two times a day (BID) | ORAL | 3 refills | Status: DC

## 2021-04-01 MED ORDER — BUPROPION HCL ER (XL) 150 MG PO TB24: 150.0000 mg | ORAL_TABLET | Freq: Every day | ORAL | 3 refills | Status: AC

## 2021-04-01 MED ORDER — AMLODIPINE BESYLATE 5 MG PO TABS: 5.0000 mg | ORAL_TABLET | Freq: Every day | ORAL | 3 refills | Status: DC

## 2021-04-01 NOTE — Assessment & Plan Note (Signed)
Denies need for additional nitro SL tabs; denies CP despite circumstances

## 2021-04-01 NOTE — Assessment & Plan Note (Signed)
SR on exam; no complaints of palpitations

## 2021-04-01 NOTE — Assessment & Plan Note (Signed)
Weight remains stable Does not want to cook/eat

## 2021-04-01 NOTE — Chronic Care Management (AMB) (Signed)
  Chronic Care Management   Note  04/01/2021 Name: Eileen Mack MRN: [id number] DOB: 1935/10/06  Eileen Mack is a 85 y.o. year old female who is a primary care patient of Bacigalupo, Dionne Bucy, MD. I reached out to Charna Busman by phone today in response to a referral sent by Eileen Mack's PCP.  Ms. Khatoon was given information about Chronic Care Management services today including:  CCM service includes personalized support from designated clinical staff supervised by her physician, including individualized plan of care and coordination with other care providers 24/7 contact phone numbers for assistance for urgent and routine care needs. Service will only be billed when office clinical staff spend 20 minutes or more in a month to coordinate care. Only one practitioner may furnish and bill the service in a calendar month. The patient may stop CCM services at any time (effective at the end of the month) by phone call to the office staff. The patient is responsible for co-pay (up to 20% after annual deductible is met) if co-pay is required by the individual health plan.   Patient agreed to services and verbal consent obtained.   Follow up plan: Telephone appointment with care management team member scheduled for: 04/07/2021  Julian Hy, [place] Management  Direct Dial: [phone]

## 2021-04-01 NOTE — Assessment & Plan Note (Signed)
Likely BP elevated d/t acute stress Pt endorses taking all medications

## 2021-04-01 NOTE — Assessment & Plan Note (Signed)
Chronic, stable rx refilled

## 2021-04-01 NOTE — Assessment & Plan Note (Signed)
High dose provided

## 2021-04-01 NOTE — Assessment & Plan Note (Signed)
recommend diet low in saturated fat and regular exercise - 30 min at least 5 times per week Repeat current rx

## 2021-04-01 NOTE — Assessment & Plan Note (Signed)
Patient very emotional and upset about the ongoing isolation and loneliness that she faces since passing of her daughter However, pt was with family over the holiday and while in office got phone call from another family member NP encouraged pt to seek connections with family and be transparent with her needs/concerns as her isolation seems self imposed at this time

## 2021-04-01 NOTE — Chronic Care Management (AMB) (Signed)
  Chronic Care Management   Outreach Note  04/01/2021 Name: [name] MRN: [id number] DOB: 04/07/1936  [name] is a 85 y.o. year old female who is a primary care patient of Bacigalupo, Marzella Schlein, MD. I reached out to [name] by phone today in response to a referral sent by Ms. Velora J Bicknell's primary care provider.  An unsuccessful telephone outreach was attempted today. The patient was referred to the case management team for assistance with care management and care coordination.   Follow Up Plan: A HIPAA compliant phone message was left for the patient providing contact information and requesting a return call.  If patient returns call to provider office, please advise to call Embedded Care Management Care Guide Lunetta Marina at [phone]  Burman Nieves, CCMA Care Guide, Embedded Care Coordination [facility]  Care Management  Direct Dial: [phone]

## 2021-04-01 NOTE — Assessment & Plan Note (Signed)
Pt reported recently dx'd with 'copd' despite longstanding tobacco hx and inhaler use Continue to encourage wean of products

## 2021-04-01 NOTE — Assessment & Plan Note (Signed)
Chronic, stable Refill rx sent

## 2021-04-01 NOTE — Assessment & Plan Note (Signed)
Request for change to pill pack to assist with medication compliance and prevent lack of medication use d/t concentration deficits

## 2021-04-01 NOTE — Assessment & Plan Note (Signed)
Chronic; unstable Likely elevated in light of ongoing stress/grief

## 2021-04-01 NOTE — Assessment & Plan Note (Signed)
Ongoing skin concerns r/t age; continue to manage as they are needed

## 2021-04-01 NOTE — Progress Notes (Signed)
Established patient visit   Patient: Eileen Mack   DOB: 1936-01-04   85 y.o. Female  MRN: [id number] Visit Date: 04/01/2021  Today's healthcare provider: Jacky Kindle, FNP   Chief Complaint  Patient presents with   Depression    Subjective    HPI  Depression, Follow-up  She  was last seen for this on 02/02/2021 (seen by Dr. Sherrie Mustache).  Changes made at last visit include none; patient felt that her medications were working well at the time.   She reports good compliance with treatment. She is not having side effects.   She reports good tolerance of treatment. Current symptoms include: depressed mood She feels she is Worse since last visit. Patient states her whole family is gone and she has no one. Her youngest daughter passed away and she feels alone. Patient states that she just wants to stay in bed all day and sleep.   Depression screen [facility] 2/9 04/01/2021 02/02/2021 01/30/2020  Decreased Interest 3 2 1   Down, Depressed, Hopeless 3 1 0  PHQ - 2 Score 6 3 1   Altered sleeping 3 0 0  Tired, decreased energy 3 1 3   Change in appetite 3 1 3   Feeling bad or failure about yourself  3 0 1  Trouble concentrating 3 3 2   Moving slowly or fidgety/restless 3 0 0  Suicidal thoughts 0 0 0  PHQ-9 Score 24 8 10   Difficult doing work/chores Extremely dIfficult Not difficult at all Not difficult at all    -----------------------------------------------------------------------------------------     Medications: Outpatient Medications Prior to Visit  Medication Sig   acetaminophen (TYLENOL) 650 MG CR tablet Take 1,300 mg by mouth every 8 (eight) hours.   albuterol (VENTOLIN HFA) 108 (90 Base) MCG/ACT inhaler Inhale 2 puffs into the lungs every 6 (six) hours as needed for wheezing or shortness of breath.   aspirin EC 81 MG tablet Take 81 mg by mouth daily.   cloNIDine (CATAPRES) 0.2 MG tablet Take 1 tablet (0.2 mg total) by mouth 2 (two) times daily.   ferrous sulfate 325  (65 FE) MG tablet Take 1 tablet (325 mg total) by mouth daily with breakfast.   fluticasone (FLONASE) 50 MCG/ACT nasal spray Place into the nose.   fluticasone furoate-vilanterol (BREO ELLIPTA) 200-25 MCG/ACT AEPB INHALE 1 PUFF IN THE MORNING   lidocaine (LIDODERM) 5 % Place 1 patch onto the skin every 12 (twelve) hours. Remove & Discard patch within 12 hours or as directed by MD   Multiple Vitamins-Minerals (MULTIVITAMIN GUMMIES ADULT PO) Take 1 each by mouth daily.   nitroGLYCERIN (NITROSTAT) 0.4 MG SL tablet Place 1 tablet (0.4 mg total) under the tongue every 5 (five) minutes as needed for chest pain.   [DISCONTINUED] amLODipine (NORVASC) 5 MG tablet Take 1 tablet (5 mg total) by mouth daily.   [DISCONTINUED] atorvastatin (LIPITOR) 40 MG tablet Take 1 tablet (40 mg total) by mouth daily.   [DISCONTINUED] carvedilol (COREG) 25 MG tablet TAKE 1 TABLET BY MOUTH TWICE DAILY WITH MEALS   [DISCONTINUED] gabapentin (NEURONTIN) 600 MG tablet Take 1 tablet (600 mg total) by mouth 3 (three) times daily.   [DISCONTINUED] gemfibrozil (LOPID) 600 MG tablet TAKE 1 TABLET BY MOUTH TWICE DAILY BEFORE A MEAL   [DISCONTINUED] isosorbide mononitrate (IMDUR) 30 MG 24 hr tablet Take 1 tablet (30 mg total) by mouth daily.   [DISCONTINUED] levothyroxine (EUTHYROX) 112 MCG tablet Take 1 tablet (112 mcg total) by mouth daily.   [  DISCONTINUED] omeprazole (PRILOSEC) 20 MG capsule Take 1 capsule (20 mg total) by mouth daily.   [DISCONTINUED] Venlafaxine HCl 225 MG TB24 Take 1 tablet (225 mg total) by mouth daily.   No facility-administered medications prior to visit.    Review of Systems  Constitutional:  Negative for appetite change, chills, fatigue and fever.  Respiratory:  Negative for chest tightness and shortness of breath.   Cardiovascular:  Negative for chest pain and palpitations.  Gastrointestinal:  Negative for abdominal pain, nausea and vomiting.  Neurological:  Negative for dizziness and weakness.   Psychiatric/Behavioral:  Positive for dysphoric mood and sleep disturbance. Negative for suicidal ideas.       Objective    BP (!) 165/76 (BP Location: Right Arm, Patient Position: Sitting, Cuff Size: Normal)   Pulse 85   Temp 98.3 F (36.8 C) (Oral)   Resp 20   Wt 163 lb (73.9 kg)   SpO2 100% Comment: room air  BMI 25.53 kg/m  {Show previous vital signs (optional):23777}  Physical Exam Vitals and nursing note reviewed.  Constitutional:      Appearance: Normal appearance. She is normal weight.  HENT:     Head: Normocephalic and atraumatic.     Right Ear: Decreased hearing noted.     Left Ear: Decreased hearing noted.  Eyes:     Comments: Decreased vision per pt report; not directly measured during visit  Cardiovascular:     Rate and Rhythm: Normal rate.     Pulses: Normal pulses.  Pulmonary:     Effort: Pulmonary effort is normal.  Neurological:     Mental Status: She is alert.  Psychiatric:        Attention and Perception: Attention normal.        Mood and Affect: Mood is depressed. Affect is tearful.        Speech: Speech normal.        Behavior: Behavior normal.        Thought Content: Thought content normal. Thought content is not paranoid or delusional. Thought content does not include homicidal or suicidal ideation. Thought content does not include homicidal or suicidal plan.        Cognition and Memory: Cognition normal.        Judgment: Judgment normal.      No results found for any visits on 04/01/21.  Assessment & Plan     Problem List Items Addressed This Visit       Cardiovascular and Mediastinum   AAA (abdominal aortic aneurysm) without rupture    Likely BP elevated d/t acute stress Pt endorses taking all medications      Relevant Medications   amLODipine (NORVASC) 5 MG tablet   atorvastatin (LIPITOR) 40 MG tablet   carvedilol (COREG) 25 MG tablet   gemfibrozil (LOPID) 600 MG tablet   isosorbide mononitrate (IMDUR) 30 MG 24 hr tablet    Paroxysmal atrial fibrillation (HCC)    SR on exam; no complaints of palpitations      Relevant Medications   amLODipine (NORVASC) 5 MG tablet   atorvastatin (LIPITOR) 40 MG tablet   carvedilol (COREG) 25 MG tablet   gemfibrozil (LOPID) 600 MG tablet   isosorbide mononitrate (IMDUR) 30 MG 24 hr tablet   Senile purpura (HCC)    Ongoing skin concerns r/t age; continue to manage as they are needed      Relevant Medications   amLODipine (NORVASC) 5 MG tablet   atorvastatin (LIPITOR) 40 MG tablet   carvedilol (COREG)  25 MG tablet   gemfibrozil (LOPID) 600 MG tablet   isosorbide mononitrate (IMDUR) 30 MG 24 hr tablet   Atherosclerosis of native coronary artery of native heart with stable angina pectoris (HCC)    Denies need for additional nitro SL tabs; denies CP despite circumstances      Relevant Medications   buPROPion (WELLBUTRIN XL) 150 MG 24 hr tablet   amLODipine (NORVASC) 5 MG tablet   atorvastatin (LIPITOR) 40 MG tablet   carvedilol (COREG) 25 MG tablet   gabapentin (NEURONTIN) 600 MG tablet   gemfibrozil (LOPID) 600 MG tablet   isosorbide mononitrate (IMDUR) 30 MG 24 hr tablet   Venlafaxine HCl 225 MG TB24   Hypertension    Chronic; unstable Likely elevated in light of ongoing stress/grief      Relevant Medications   amLODipine (NORVASC) 5 MG tablet   atorvastatin (LIPITOR) 40 MG tablet   carvedilol (COREG) 25 MG tablet   gemfibrozil (LOPID) 600 MG tablet   isosorbide mononitrate (IMDUR) 30 MG 24 hr tablet     Respiratory   Acute respiratory failure (HCC)    Pt reported recently dx'd with 'copd' despite longstanding tobacco hx and inhaler use Continue to encourage wean of products        Digestive   GERD (gastroesophageal reflux disease)    Chronic stable rx refilled      Relevant Medications   omeprazole (PRILOSEC) 20 MG capsule     Endocrine   Hypothyroidism    Chronic, stable rx refilled      Relevant Medications   carvedilol (COREG) 25 MG  tablet   levothyroxine (EUTHYROX) 112 MCG tablet     Nervous and Auditory   Neuropathy    Chronic, stable Refill rx sent      Relevant Medications   gabapentin (NEURONTIN) 600 MG tablet     Other   Depression, prolonged - Primary    Patient very emotional and upset about the ongoing isolation and loneliness that she faces since passing of her daughter However, pt was with family over the holiday and while in office got phone call from another family member NP encouraged pt to seek connections with family and be transparent with her needs/concerns as her isolation seems self imposed at this time      Relevant Medications   buPROPion (WELLBUTRIN XL) 150 MG 24 hr tablet   Venlafaxine HCl 225 MG TB24   Other Relevant Orders   AMB Referral to [facility] Coordin[facility]   Ambulatory referral to Psychiatry   Grief at loss of child    Recently loss last child, a daughter      Loss of appetite    Weight remains stable Does not want to cook/eat      Isolation (social)    Appears self imposed       Lack of concentration    Request for change to pill pack to assist with medication compliance and prevent lack of medication use d/t concentration deficits       Relevant Medications   buPROPion (WELLBUTRIN XL) 150 MG 24 hr tablet   Hyperlipidemia    recommend diet low in saturated fat and regular exercise - 30 min at least 5 times per week Repeat current rx      Relevant Medications   amLODipine (NORVASC) 5 MG tablet   atorvastatin (LIPITOR) 40 MG tablet   carvedilol (COREG) 25 MG tablet   gemfibrozil (LOPID) 600 MG tablet   isosorbide mononitrate (IMDUR) 30 MG  24 hr tablet   Need for influenza vaccination    High dose provided      Relevant Orders   Flu Vaccine QUAD High Dose IM (Fluad) (Completed)     Return in about 4 weeks (around 04/29/2021) for anxiety and depression.     Leilani Merl, FNP, have reviewed all documentation for this visit. The documentation  on 04/01/21 for the exam, diagnosis, procedures, and orders are all accurate and complete.    Jacky Kindle, FNP  [facility] [phone] (phone) [phone] (fax)  [facility]

## 2021-04-01 NOTE — Assessment & Plan Note (Signed)
Recently loss last child, a daughter

## 2021-04-01 NOTE — Assessment & Plan Note (Signed)
Appears self imposed

## 2021-04-01 NOTE — Assessment & Plan Note (Signed)
Chronic stable rx refilled

## 2021-04-03 ENCOUNTER — Emergency Department: Payer: Medicare HMO

## 2021-04-03 ENCOUNTER — Inpatient Hospital Stay
Admission: EM | Admit: 2021-04-03 | Discharge: 2021-04-07 | DRG: 392 | Disposition: A | Discharge status: Home / Self Care | Payer: Medicare HMO | Attending: Student in an Organized Health Care Education/Training Program | Admitting: Student in an Organized Health Care Education/Training Program

## 2021-04-03 DIAGNOSIS — Z882 Allergy status to sulfonamides status: Secondary | ICD-10-CM

## 2021-04-03 DIAGNOSIS — Z888 Allergy status to other drugs, medicaments and biological substances status: Secondary | ICD-10-CM

## 2021-04-03 DIAGNOSIS — I4891 Unspecified atrial fibrillation: Secondary | ICD-10-CM | POA: Diagnosis present

## 2021-04-03 DIAGNOSIS — E860 Dehydration: Secondary | ICD-10-CM | POA: Diagnosis present

## 2021-04-03 DIAGNOSIS — Z79890 Hormone replacement therapy (postmenopausal): Secondary | ICD-10-CM

## 2021-04-03 DIAGNOSIS — Z66 Do not resuscitate: Secondary | ICD-10-CM | POA: Diagnosis present

## 2021-04-03 DIAGNOSIS — I8393 Asymptomatic varicose veins of bilateral lower extremities: Secondary | ICD-10-CM | POA: Diagnosis present

## 2021-04-03 DIAGNOSIS — Z79899 Other long term (current) drug therapy: Secondary | ICD-10-CM

## 2021-04-03 DIAGNOSIS — A084 Viral intestinal infection, unspecified: Secondary | ICD-10-CM

## 2021-04-03 DIAGNOSIS — Z7901 Long term (current) use of anticoagulants: Secondary | ICD-10-CM

## 2021-04-03 DIAGNOSIS — K529 Noninfective gastroenteritis and colitis, unspecified: Secondary | ICD-10-CM | POA: Diagnosis present

## 2021-04-03 DIAGNOSIS — K219 Gastro-esophageal reflux disease without esophagitis: Secondary | ICD-10-CM | POA: Diagnosis present

## 2021-04-03 DIAGNOSIS — D500 Iron deficiency anemia secondary to blood loss (chronic): Secondary | ICD-10-CM | POA: Diagnosis present

## 2021-04-03 DIAGNOSIS — Z8249 Family history of ischemic heart disease and other diseases of the circulatory system: Secondary | ICD-10-CM

## 2021-04-03 DIAGNOSIS — F32A Depression, unspecified: Secondary | ICD-10-CM | POA: Diagnosis present

## 2021-04-03 DIAGNOSIS — R0602 Shortness of breath: Secondary | ICD-10-CM

## 2021-04-03 DIAGNOSIS — R739 Hyperglycemia, unspecified: Secondary | ICD-10-CM | POA: Diagnosis present

## 2021-04-03 DIAGNOSIS — N179 Acute kidney failure, unspecified: Secondary | ICD-10-CM | POA: Diagnosis present

## 2021-04-03 DIAGNOSIS — J449 Chronic obstructive pulmonary disease, unspecified: Secondary | ICD-10-CM | POA: Diagnosis present

## 2021-04-03 DIAGNOSIS — E876 Hypokalemia: Secondary | ICD-10-CM | POA: Diagnosis present

## 2021-04-03 DIAGNOSIS — E039 Hypothyroidism, unspecified: Secondary | ICD-10-CM | POA: Diagnosis present

## 2021-04-03 DIAGNOSIS — Z720 Tobacco use: Secondary | ICD-10-CM | POA: Diagnosis present

## 2021-04-03 DIAGNOSIS — A0839 Other viral enteritis: Principal | ICD-10-CM | POA: Diagnosis present

## 2021-04-03 DIAGNOSIS — J439 Emphysema, unspecified: Secondary | ICD-10-CM | POA: Diagnosis present

## 2021-04-03 DIAGNOSIS — I480 Paroxysmal atrial fibrillation: Secondary | ICD-10-CM | POA: Diagnosis present

## 2021-04-03 DIAGNOSIS — N1831 Chronic kidney disease, stage 3a: Secondary | ICD-10-CM | POA: Diagnosis present

## 2021-04-03 DIAGNOSIS — Z20822 Contact with and (suspected) exposure to covid-19: Secondary | ICD-10-CM | POA: Diagnosis present

## 2021-04-03 DIAGNOSIS — F17210 Nicotine dependence, cigarettes, uncomplicated: Secondary | ICD-10-CM | POA: Diagnosis present

## 2021-04-03 DIAGNOSIS — I129 Hypertensive chronic kidney disease with stage 1 through stage 4 chronic kidney disease, or unspecified chronic kidney disease: Secondary | ICD-10-CM | POA: Diagnosis present

## 2021-04-03 DIAGNOSIS — Z801 Family history of malignant neoplasm of trachea, bronchus and lung: Secondary | ICD-10-CM

## 2021-04-03 DIAGNOSIS — I2510 Atherosclerotic heart disease of native coronary artery without angina pectoris: Secondary | ICD-10-CM | POA: Diagnosis present

## 2021-04-03 DIAGNOSIS — Z7982 Long term (current) use of aspirin: Secondary | ICD-10-CM

## 2021-04-03 DIAGNOSIS — I10 Essential (primary) hypertension: Secondary | ICD-10-CM | POA: Diagnosis present

## 2021-04-03 DIAGNOSIS — Z90710 Acquired absence of both cervix and uterus: Secondary | ICD-10-CM

## 2021-04-03 DIAGNOSIS — R6510 Systemic inflammatory response syndrome (SIRS) of non-infectious origin without acute organ dysfunction: Secondary | ICD-10-CM | POA: Diagnosis present

## 2021-04-03 DIAGNOSIS — E785 Hyperlipidemia, unspecified: Secondary | ICD-10-CM | POA: Diagnosis present

## 2021-04-03 DIAGNOSIS — Z808 Family history of malignant neoplasm of other organs or systems: Secondary | ICD-10-CM

## 2021-04-03 DIAGNOSIS — I714 Abdominal aortic aneurysm, without rupture, unspecified: Secondary | ICD-10-CM | POA: Diagnosis present

## 2021-04-03 DIAGNOSIS — R0603 Acute respiratory distress: Secondary | ICD-10-CM | POA: Diagnosis not present

## 2021-04-03 DIAGNOSIS — R7303 Prediabetes: Secondary | ICD-10-CM | POA: Diagnosis present

## 2021-04-03 DIAGNOSIS — Z7951 Long term (current) use of inhaled steroids: Secondary | ICD-10-CM

## 2021-04-03 LAB — COMPREHENSIVE METABOLIC PANEL
ALT: 20 U/L (ref 0–44)
AST: 37 U/L (ref 15–41)
Albumin: 3.3 g/dL — ABNORMAL LOW (ref 3.5–5.0)
Alkaline Phosphatase: 117 U/L (ref 38–126)
Anion gap: 16 — ABNORMAL HIGH (ref 5–15)
BUN: 40 mg/dL — ABNORMAL HIGH (ref 8–23)
CO2: 25 mmol/L (ref 22–32)
Calcium: 8.4 mg/dL — ABNORMAL LOW (ref 8.9–10.3)
Chloride: 94 mmol/L — ABNORMAL LOW (ref 98–111)
Creatinine, Ser: 1.77 mg/dL — ABNORMAL HIGH (ref 0.44–1.00)
GFR, Estimated: 28 mL/min — ABNORMAL LOW (ref >60)
Glucose, Bld: 257 mg/dL — ABNORMAL HIGH (ref 70–99)
Potassium: 3 mmol/L — ABNORMAL LOW (ref 3.5–5.1)
Sodium: 135 mmol/L (ref 135–145)
Total Bilirubin: 0.7 mg/dL (ref 0.3–1.2)
Total Protein: 7.6 g/dL (ref 6.5–8.1)

## 2021-04-03 LAB — CBC WITH DIFFERENTIAL/PLATELET
Abs Immature Granulocytes: 0.08 10*3/uL — ABNORMAL HIGH (ref 0.00–0.07)
Basophils Absolute: 0.1 10*3/uL (ref 0.0–0.1)
Basophils Relative: 0 %
Eosinophils Absolute: 0.1 10*3/uL (ref 0.0–0.5)
Eosinophils Relative: 0 %
HCT: 40.2 % (ref 36.0–46.0)
Hemoglobin: 13.5 g/dL (ref 12.0–15.0)
Immature Granulocytes: 1 %
Lymphocytes Relative: 6 %
Lymphs Abs: 0.8 10*3/uL (ref 0.7–4.0)
MCH: 29.2 pg (ref 26.0–34.0)
MCHC: 33.6 g/dL (ref 30.0–36.0)
MCV: 86.8 fL (ref 80.0–100.0)
Monocytes Absolute: 0.8 10*3/uL (ref 0.1–1.0)
Monocytes Relative: 5 %
Neutro Abs: 12 10*3/uL — ABNORMAL HIGH (ref 1.7–7.7)
Neutrophils Relative %: 88 %
Platelets: 409 10*3/uL — ABNORMAL HIGH (ref 150–400)
RBC: 4.63 MIL/uL (ref 3.87–5.11)
RDW: 14.2 % (ref 11.5–15.5)
WBC: 13.8 10*3/uL — ABNORMAL HIGH (ref 4.0–10.5)
nRBC: 0 % (ref 0.0–0.2)

## 2021-04-03 LAB — RESP PANEL BY RT-PCR (FLU A&B, COVID) ARPGX2
Influenza A by PCR: NEGATIVE
Influenza B by PCR: NEGATIVE
SARS Coronavirus 2 by RT PCR: NEGATIVE

## 2021-04-03 LAB — LIPASE, BLOOD: Lipase: 30 U/L (ref 11–51)

## 2021-04-03 MED ORDER — MORPHINE SULFATE (PF) 4 MG/ML IV SOLN
4.0000 mg | Freq: Once | INTRAVENOUS | Status: AC
  Administered 2021-04-03: 4 mg via INTRAVENOUS
  Filled 2021-04-03: qty 1

## 2021-04-03 MED ORDER — ONDANSETRON HCL 4 MG/2ML IJ SOLN
4.0000 mg | Freq: Once | INTRAMUSCULAR | Status: AC
  Administered 2021-04-03: 4 mg via INTRAVENOUS
  Filled 2021-04-03: qty 2

## 2021-04-03 MED ORDER — SODIUM CHLORIDE 0.9 % IV BOLUS
1000.0000 mL | Freq: Once | INTRAVENOUS | Status: AC
  Administered 2021-04-03: 1000 mL via INTRAVENOUS

## 2021-04-03 NOTE — ED Triage Notes (Signed)
Pt arrives today from home with [facility] with N/V/D since Tuesday. Pt endorsing that family was visiting and they got sick from their family. Pt arrives moaning "help me Im so sick.Marland KitchenMarland Kitchen"

## 2021-04-04 ENCOUNTER — Other Ambulatory Visit: Payer: Self-pay

## 2021-04-04 ENCOUNTER — Encounter: Payer: Self-pay | Admitting: Emergency Medicine

## 2021-04-04 DIAGNOSIS — N179 Acute kidney failure, unspecified: Secondary | ICD-10-CM

## 2021-04-04 DIAGNOSIS — K529 Noninfective gastroenteritis and colitis, unspecified: Secondary | ICD-10-CM | POA: Diagnosis present

## 2021-04-04 DIAGNOSIS — F32A Depression, unspecified: Secondary | ICD-10-CM | POA: Diagnosis present

## 2021-04-04 DIAGNOSIS — K219 Gastro-esophageal reflux disease without esophagitis: Secondary | ICD-10-CM

## 2021-04-04 DIAGNOSIS — I4891 Unspecified atrial fibrillation: Secondary | ICD-10-CM

## 2021-04-04 DIAGNOSIS — R7303 Prediabetes: Secondary | ICD-10-CM

## 2021-04-04 DIAGNOSIS — R6510 Systemic inflammatory response syndrome (SIRS) of non-infectious origin without acute organ dysfunction: Secondary | ICD-10-CM | POA: Diagnosis present

## 2021-04-04 DIAGNOSIS — N1831 Chronic kidney disease, stage 3a: Secondary | ICD-10-CM | POA: Diagnosis present

## 2021-04-04 DIAGNOSIS — I10 Essential (primary) hypertension: Secondary | ICD-10-CM

## 2021-04-04 DIAGNOSIS — J449 Chronic obstructive pulmonary disease, unspecified: Secondary | ICD-10-CM | POA: Diagnosis present

## 2021-04-04 DIAGNOSIS — E039 Hypothyroidism, unspecified: Secondary | ICD-10-CM

## 2021-04-04 DIAGNOSIS — E876 Hypokalemia: Secondary | ICD-10-CM

## 2021-04-04 LAB — GLUCOSE, CAPILLARY
Glucose-Capillary: 131 mg/dL — ABNORMAL HIGH (ref 70–99)
Glucose-Capillary: 162 mg/dL — ABNORMAL HIGH (ref 70–99)

## 2021-04-04 LAB — CBG MONITORING, ED: Glucose-Capillary: 180 mg/dL — ABNORMAL HIGH (ref 70–99)

## 2021-04-04 LAB — MAGNESIUM: Magnesium: 1.1 mg/dL — ABNORMAL LOW (ref 1.7–2.4)

## 2021-04-04 LAB — PROTIME-INR
INR: 1.3 — ABNORMAL HIGH (ref 0.8–1.2)
Prothrombin Time: 15.7 seconds — ABNORMAL HIGH (ref 11.4–15.2)

## 2021-04-04 LAB — PROCALCITONIN: Procalcitonin: 0.35 ng/mL

## 2021-04-04 LAB — LACTIC ACID, PLASMA
Lactic Acid, Venous: 3.8 mmol/L (ref 0.5–1.9)
Lactic Acid, Venous: 2.2 mmol/L (ref 0.5–1.9)
Lactic Acid, Venous: 2 mmol/L (ref 0.5–1.9)
Lactic Acid, Venous: 1.8 mmol/L (ref 0.5–1.9)

## 2021-04-04 MED ORDER — ACETAMINOPHEN 325 MG PO TABS
650.0000 mg | ORAL_TABLET | Freq: Four times a day (QID) | ORAL | Status: DC | PRN
  Administered 2021-04-07: 650 mg via ORAL
  Filled 2021-04-04: qty 2

## 2021-04-04 MED ORDER — GABAPENTIN 600 MG PO TABS
600.0000 mg | ORAL_TABLET | Freq: Three times a day (TID) | ORAL | Status: DC
  Administered 2021-04-04 – 2021-04-07 (×11): 600 mg via ORAL
  Filled 2021-04-04 (×13): qty 1

## 2021-04-04 MED ORDER — INSULIN ASPART 100 UNIT/ML IJ SOLN: 0.0000 [IU] | Freq: Every day | INTRAMUSCULAR | Status: DC

## 2021-04-04 MED ORDER — LEVOTHYROXINE SODIUM 112 MCG PO TABS
112.0000 ug | ORAL_TABLET | Freq: Every day | ORAL | Status: DC
  Administered 2021-04-05 – 2021-04-07 (×3): 112 ug via ORAL
  Filled 2021-04-04 (×4): qty 1

## 2021-04-04 MED ORDER — ALBUTEROL SULFATE (2.5 MG/3ML) 0.083% IN NEBU
3.0000 mL | INHALATION_SOLUTION | RESPIRATORY_TRACT | Status: DC | PRN
  Filled 2021-04-04: qty 3

## 2021-04-04 MED ORDER — NICOTINE 21 MG/24HR TD PT24
21.0000 mg | MEDICATED_PATCH | Freq: Every day | TRANSDERMAL | Status: DC
  Filled 2021-04-04 (×3): qty 1

## 2021-04-04 MED ORDER — FLUTICASONE FUROATE-VILANTEROL 200-25 MCG/ACT IN AEPB
1.0000 | INHALATION_SPRAY | Freq: Every day | RESPIRATORY_TRACT | Status: DC
  Administered 2021-04-04 – 2021-04-07 (×4): 1 via RESPIRATORY_TRACT
  Filled 2021-04-04 (×3): qty 28

## 2021-04-04 MED ORDER — POTASSIUM CHLORIDE CRYS ER 20 MEQ PO TBCR
40.0000 meq | EXTENDED_RELEASE_TABLET | Freq: Once | ORAL | Status: AC
  Administered 2021-04-04: 02:00:00 40 meq via ORAL
  Filled 2021-04-04: qty 2

## 2021-04-04 MED ORDER — ASPIRIN EC 81 MG PO TBEC
81.0000 mg | DELAYED_RELEASE_TABLET | Freq: Every day | ORAL | Status: DC
  Administered 2021-04-04 – 2021-04-07 (×4): 81 mg via ORAL
  Filled 2021-04-04 (×4): qty 1

## 2021-04-04 MED ORDER — FERROUS SULFATE 325 (65 FE) MG PO TABS
325.0000 mg | ORAL_TABLET | Freq: Every day | ORAL | Status: DC
  Administered 2021-04-04 – 2021-04-07 (×4): 325 mg via ORAL
  Filled 2021-04-04 (×4): qty 1

## 2021-04-04 MED ORDER — NITROGLYCERIN 0.4 MG SL SUBL: 0.4000 mg | SUBLINGUAL_TABLET | SUBLINGUAL | Status: DC | PRN

## 2021-04-04 MED ORDER — DM-GUAIFENESIN ER 30-600 MG PO TB12
1.0000 | ORAL_TABLET | Freq: Two times a day (BID) | ORAL | Status: DC | PRN
  Filled 2021-04-04: qty 1

## 2021-04-04 MED ORDER — ADULT MULTIVITAMIN W/MINERALS CH
ORAL_TABLET | Freq: Every day | ORAL | Status: DC
  Administered 2021-04-04 – 2021-04-07 (×3): 1 via ORAL
  Filled 2021-04-04 (×3): qty 1

## 2021-04-04 MED ORDER — CARVEDILOL 12.5 MG PO TABS
25.0000 mg | ORAL_TABLET | Freq: Two times a day (BID) | ORAL | Status: DC
  Administered 2021-04-04 – 2021-04-07 (×8): 25 mg via ORAL
  Filled 2021-04-04: qty 2
  Filled 2021-04-04 (×6): qty 1
  Filled 2021-04-04: qty 2

## 2021-04-04 MED ORDER — BUPROPION HCL ER (XL) 150 MG PO TB24
150.0000 mg | ORAL_TABLET | Freq: Every day | ORAL | Status: DC
  Administered 2021-04-04 – 2021-04-07 (×4): 150 mg via ORAL
  Filled 2021-04-04 (×4): qty 1

## 2021-04-04 MED ORDER — ENOXAPARIN SODIUM 30 MG/0.3ML IJ SOSY
30.0000 mg | PREFILLED_SYRINGE | INTRAMUSCULAR | Status: DC
  Administered 2021-04-04 – 2021-04-05 (×2): 30 mg via SUBCUTANEOUS
  Filled 2021-04-04 (×2): qty 0.3

## 2021-04-04 MED ORDER — INSULIN ASPART 100 UNIT/ML IJ SOLN
0.0000 [IU] | Freq: Three times a day (TID) | INTRAMUSCULAR | Status: DC
  Administered 2021-04-04: 17:00:00 1 [IU] via SUBCUTANEOUS
  Administered 2021-04-05: 3 [IU] via SUBCUTANEOUS
  Administered 2021-04-06: 1 [IU] via SUBCUTANEOUS
  Administered 2021-04-06: 2 [IU] via SUBCUTANEOUS
  Administered 2021-04-07: 1 [IU] via SUBCUTANEOUS
  Filled 2021-04-04 (×5): qty 1

## 2021-04-04 MED ORDER — ONDANSETRON 4 MG PO TBDP: 4.0000 mg | ORAL_TABLET | Freq: Three times a day (TID) | ORAL | 0 refills | Status: AC | PRN

## 2021-04-04 MED ORDER — DIPHENHYDRAMINE HCL 50 MG/ML IJ SOLN
12.5000 mg | Freq: Three times a day (TID) | INTRAMUSCULAR | Status: DC | PRN
  Administered 2021-04-06: 12.5 mg via INTRAVENOUS
  Filled 2021-04-04: qty 1

## 2021-04-04 MED ORDER — MAGNESIUM SULFATE 2 GM/50ML IV SOLN
2.0000 g | Freq: Once | INTRAVENOUS | Status: AC
  Administered 2021-04-04: 02:00:00 2 g via INTRAVENOUS
  Filled 2021-04-04: qty 50

## 2021-04-04 MED ORDER — PANTOPRAZOLE SODIUM 40 MG PO TBEC
40.0000 mg | DELAYED_RELEASE_TABLET | Freq: Every day | ORAL | Status: DC
  Administered 2021-04-04 – 2021-04-07 (×4): 40 mg via ORAL
  Filled 2021-04-04 (×4): qty 1

## 2021-04-04 MED ORDER — GEMFIBROZIL 600 MG PO TABS
600.0000 mg | ORAL_TABLET | Freq: Two times a day (BID) | ORAL | Status: DC
  Administered 2021-04-04 – 2021-04-07 (×8): 600 mg via ORAL
  Filled 2021-04-04 (×9): qty 1

## 2021-04-04 MED ORDER — VENLAFAXINE HCL ER 75 MG PO CP24
225.0000 mg | ORAL_CAPSULE | Freq: Every day | ORAL | Status: DC
  Administered 2021-04-04 – 2021-04-07 (×4): 225 mg via ORAL
  Filled 2021-04-04 (×5): qty 3

## 2021-04-04 MED ORDER — SODIUM CHLORIDE 0.9 % IV BOLUS
1000.0000 mL | Freq: Once | INTRAVENOUS | Status: AC
  Administered 2021-04-04: 02:00:00 1000 mL via INTRAVENOUS

## 2021-04-04 MED ORDER — FAMOTIDINE 20 MG PO TABS: 20.0000 mg | ORAL_TABLET | Freq: Two times a day (BID) | ORAL | 0 refills | Status: AC

## 2021-04-04 MED ORDER — HYDRALAZINE HCL 20 MG/ML IJ SOLN
5.0000 mg | INTRAMUSCULAR | Status: DC | PRN
  Filled 2021-04-04: qty 1

## 2021-04-04 MED ORDER — POTASSIUM CHLORIDE 10 MEQ/100ML IV SOLN
10.0000 meq | Freq: Once | INTRAVENOUS | Status: AC
  Administered 2021-04-04: 02:00:00 10 meq via INTRAVENOUS
  Filled 2021-04-04: qty 100

## 2021-04-04 MED ORDER — SODIUM CHLORIDE 0.9 % IV SOLN: INTRAVENOUS | Status: DC

## 2021-04-04 MED ORDER — PIPERACILLIN-TAZOBACTAM 3.375 G IVPB
3.3750 g | Freq: Three times a day (TID) | INTRAVENOUS | Status: DC
  Administered 2021-04-04 – 2021-04-06 (×6): 3.375 g via INTRAVENOUS
  Filled 2021-04-04 (×6): qty 50

## 2021-04-04 MED ORDER — ONDANSETRON HCL 4 MG/2ML IJ SOLN: 4.0000 mg | Freq: Three times a day (TID) | INTRAMUSCULAR | Status: DC | PRN

## 2021-04-04 MED ORDER — LOPERAMIDE HCL 2 MG PO TABS: 4.0000 mg | ORAL_TABLET | Freq: Four times a day (QID) | ORAL | 0 refills | Status: AC | PRN

## 2021-04-04 MED ORDER — ATORVASTATIN CALCIUM 20 MG PO TABS
40.0000 mg | ORAL_TABLET | Freq: Every day | ORAL | Status: DC
  Administered 2021-04-04 – 2021-04-07 (×4): 40 mg via ORAL
  Filled 2021-04-04 (×4): qty 2

## 2021-04-04 MED ORDER — PIPERACILLIN-TAZOBACTAM 3.375 G IVPB: 3.3750 g | Freq: Once | INTRAVENOUS | Status: DC

## 2021-04-04 NOTE — ED Provider Notes (Signed)
[facility] Emergency Department Provider Note  ____________________________________________  Time seen: Approximately 12:11 AM  I have reviewed the triage vital signs and the nursing notes.   HISTORY  Chief Complaint Nausea    HPI Eileen Mack is a 85 y.o. female with a history of atrial fibrillation COPD GERD hypertension who comes to the ED complaining of nausea vomiting diarrhea for the past 4 days with generalized abdominal pain which is crampy and nonradiating.  Reports that family members have recently been sick with similar symptoms.  Denies chest pain shortness of breath fever or syncope.  Symptoms are waxing and waning, constant, no aggravating or alleviating factors.  Not able to eat or drink today.    Past Medical History:  Diagnosis Date   AAA (abdominal aortic aneurysm)    Allergy    Anemia    Atrial fibrillation (HCC)    Blood transfusion without reported diagnosis    CAD (coronary artery disease)    s/p PCI to LAD & RCA   COPD (chronic obstructive pulmonary disease) (HCC)    Depression    Emphysema of lung (HCC)    GERD (gastroesophageal reflux disease)    History of blood clots    Hyperlipidemia    Hypertension    Personal history of tobacco use, presenting hazards to health 07/01/2015   Renal disorder    Thyroid disease      Patient Active Problem List   Diagnosis Date Noted   Grief at loss of child 04/01/2021   Loss of appetite 04/01/2021   Isolation (social) 04/01/2021   Lack of concentration 04/01/2021   Hyperlipidemia 04/01/2021   Need for influenza vaccination 04/01/2021   Lung nodules 05/04/2020   Abnormal findings on diagnostic imaging of lung 05/04/2020   Hypertension 05/04/2020   ACS (acute coronary syndrome) (HCC)    Hallucinations    Acute respiratory failure (HCC) 04/02/2020   OA (osteoarthritis) 09/19/2019   History of recurrent ear infection 09/19/2019   Prediabetes 07/31/2019   Chronic right  shoulder pain 02/26/2019   Senile purpura (HCC) 02/26/2019   Breast pain, left 02/26/2019   Renal mass 02/26/2019   COPD with acute exacerbation (HCC) 01/06/2019   AAA (abdominal aortic aneurysm) without rupture 10/10/2017   Varicose veins of both lower extremities with pain 10/10/2017   Hyperlipemia, mixed 08/14/2017   Neuropathy 08/14/2017   Depression, prolonged 08/14/2017   Cervical disc disease 08/14/2017   Diarrhea 08/14/2017   Tobacco abuse 08/14/2017   Multiple pulmonary nodules 04/29/2016   Iron deficiency anemia due to chronic blood loss 07/29/2015   Tobacco dependence 07/01/2015   PUD (peptic ulcer disease) 06/18/2014   Bigeminy 09/03/2013   Onychomycosis 02/25/2013   Hallux valgus with bunions 01/14/2013   Hammertoe 01/14/2013   DDD (degenerative disc disease), lumbar 08/09/2012   Atherosclerosis of native coronary artery of native heart with stable angina pectoris (HCC) 08/03/2012   Hypothyroidism 07/02/2012   GERD (gastroesophageal reflux disease) 07/02/2012   Cervical spondylosis without myelopathy 07/21/2011   Sciatica 07/21/2011   Accelerated hypertension 11/26/2010   Paroxysmal atrial fibrillation (HCC) 11/26/2010     Past Surgical History:  Procedure Laterality Date   ABDOMINAL HYSTERECTOMY     APPENDECTOMY     CARDIAC SURGERY     CHOLECYSTECTOMY     COLONOSCOPY WITH PROPOFOL Bilateral 08/01/2015   Procedure: COLONOSCOPY WITH PROPOFOL;  Surgeon: Scot Jun, MD;  Location[facility] ENDOSCOPY;  Service: Endoscopy;  Laterality: Bilateral;   ENDOVASCULAR REPAIR/STENT GRAFT N/A 02/21/2018  Procedure: ENDOVASCULAR REPAIR/STENT GRAFT;  Surgeon: Renford Dills, MD;  Location: [facility] INVASIVE CV LAB;  Service: Cardiovascular;  Laterality: N/A;   ESOPHAGOGASTRODUODENOSCOPY N/A 07/31/2015   Procedure: ESOPHAGOGASTRODUODENOSCOPY (EGD);  Surgeon: Scot Jun, MD;  Location: [facility] ENDOSCOPY;  Service: Endoscopy;  Laterality: N/A;   EYE SURGERY     LEFT HEART  CATH AND CORONARY ANGIOGRAPHY N/A 04/03/2020   Procedure: LEFT HEART CATH AND CORONARY ANGIOGRAPHY possible PCI and stent;  Surgeon: Alwyn Pea, MD;  Location: [facility] INVASIVE CV LAB;  Service: Cardiovascular;  Laterality: N/A;     Prior to Admission medications   Medication Sig Start Date End Date Taking? Authorizing Provider  acetaminophen (TYLENOL) 650 MG CR tablet Take 1,300 mg by mouth every 8 (eight) hours.   Yes [provider]  albuterol (VENTOLIN HFA) 108 (90 Base) MCG/ACT inhaler Inhale 2 puffs into the lungs every 6 (six) hours as needed for wheezing or shortness of breath. 05/04/20  Yes Bacigalupo, Marzella Schlein, MD  amLODipine (NORVASC) 5 MG tablet Take 1 tablet (5 mg total) by mouth daily. 04/01/21  Yes Jacky Kindle, FNP  aspirin EC 81 MG tablet Take 81 mg by mouth daily.   Yes [provider]  atorvastatin (LIPITOR) 40 MG tablet Take 1 tablet (40 mg total) by mouth daily. 04/01/21  Yes Merita Norton T, FNP  buPROPion (WELLBUTRIN XL) 150 MG 24 hr tablet Take 1 tablet (150 mg total) by mouth daily. 04/01/21  Yes Jacky Kindle, FNP  carvedilol (COREG) 25 MG tablet Take 1 tablet (25 mg total) by mouth 2 (two) times daily with a meal. 04/01/21  Yes Jacky Kindle, FNP  cloNIDine (CATAPRES) 0.2 MG tablet Take 1 tablet (0.2 mg total) by mouth 2 (two) times daily. 02/16/21  Yes Bacigalupo, Marzella Schlein, MD  famotidine (PEPCID) 20 MG tablet Take 1 tablet (20 mg total) by mouth 2 (two) times daily. 04/04/21  Yes Sharman Cheek, MD  ferrous sulfate 325 (65 FE) MG tablet Take 1 tablet (325 mg total) by mouth daily with breakfast. 08/01/15  Yes Mody, Sital, MD  fluticasone (FLONASE) 50 MCG/ACT nasal spray Place into the nose. 08/15/19  Yes [provider]  fluticasone furoate-vilanterol (BREO ELLIPTA) 200-25 MCG/ACT AEPB INHALE 1 PUFF IN THE MORNING 03/19/21  Yes Bacigalupo, Marzella Schlein, MD  gabapentin (NEURONTIN) 600 MG tablet Take 1 tablet (600 mg total) by mouth 3 (three) times  daily. 04/01/21  Yes Jacky Kindle, FNP  gemfibrozil (LOPID) 600 MG tablet Take 1 tablet (600 mg total) by mouth 2 (two) times daily before a meal. 04/01/21  Yes Jacky Kindle, FNP  isosorbide mononitrate (IMDUR) 30 MG 24 hr tablet Take 1 tablet (30 mg total) by mouth daily. 04/01/21  Yes Jacky Kindle, FNP  levothyroxine (EUTHYROX) 112 MCG tablet Take 1 tablet (112 mcg total) by mouth daily. OK for change in manufacturer 04/01/21  Yes Merita Norton T, FNP  lidocaine (LIDODERM) 5 % Place 1 patch onto the skin every 12 (twelve) hours. Remove & Discard patch within 12 hours or as directed by MD 02/09/21 02/09/22 Yes Delton Prairie, MD  loperamide (IMODIUM A-D) 2 MG tablet Take 2 tablets (4 mg total) by mouth 4 (four) times daily as needed for diarrhea or loose stools. 04/04/21  Yes Sharman Cheek, MD  Multiple Vitamins-Minerals (MULTIVITAMIN GUMMIES ADULT PO) Take 1 each by mouth daily.   Yes [provider]  omeprazole (PRILOSEC) 20 MG capsule Take 1 capsule (20 mg total)  by mouth daily. 04/01/21  Yes Jacky Kindle, FNP  ondansetron (ZOFRAN-ODT) 4 MG disintegrating tablet Take 1 tablet (4 mg total) by mouth every 8 (eight) hours as needed for nausea or vomiting. 04/04/21  Yes Sharman Cheek, MD  Venlafaxine HCl 225 MG TB24 Take 1 tablet (225 mg total) by mouth daily. 04/01/21  Yes Jacky Kindle, FNP  nitroGLYCERIN (NITROSTAT) 0.4 MG SL tablet Place 1 tablet (0.4 mg total) under the tongue every 5 (five) minutes as needed for chest pain. 09/24/20   Erasmo Downer, MD     Allergies Iodinated diagnostic agents and Sulfa antibiotics   Family History  Problem Relation Age of Onset   Heart attack Father 53   Brain cancer Sister    Lung cancer Sister    Breast cancer Neg Hx    Colon cancer Neg Hx     Social History Social History   Tobacco Use   Smoking status: Every Day    Packs/day: 1.00    Years: 62.00    Pack years: 62.00    Types: Cigarettes   Smokeless tobacco: Never   Vaping Use   Vaping Use: Never used  Substance Use Topics   Alcohol use: Not Currently   Drug use: Never    Review of Systems  Constitutional:   No fever or chills.  ENT:   No sore throat. No rhinorrhea. Cardiovascular:   No chest pain or syncope. Respiratory:   No dyspnea or cough. Gastrointestinal:   Positive as above for abdominal pain, vomiting and diarrhea.  Musculoskeletal:   Negative for focal pain or swelling All other systems reviewed and are negative except as documented above in ROS and HPI.  ____________________________________________   PHYSICAL EXAM:  VITAL SIGNS: ED Triage Vitals  Enc Vitals Group     BP 04/03/21 2222 (!) 186/83     Pulse Rate 04/03/21 2222 (!) 106     Resp 04/03/21 2222 20     Temp 04/03/21 2222 97.8 F (36.6 C)     Temp src --      SpO2 04/03/21 2222 98 %     Weight 04/03/21 2219 150 lb (68 kg)     Height --      Head Circumference --      Peak Flow --      Pain Score 04/03/21 2218 0     Pain Loc --      Pain Edu? --      Excl. in GC? --     Vital signs reviewed, nursing assessments reviewed.   Constitutional:   Alert and oriented. Non-toxic appearance. Eyes:   Conjunctivae are normal. EOMI. PERRL. ENT      Head:   Normocephalic and atraumatic.      Nose:   Normal      Mouth/Throat:   Dry mucous membranes.      Neck:   No meningismus. Full ROM. Hematological/Lymphatic/Immunilogical:   No cervical lymphadenopathy. Cardiovascular:   Irregularly irregular rhythm, heart rate 70-110. Symmetric bilateral radial and DP pulses.  No murmurs. Cap refill less than 2 seconds. Respiratory:   Normal respiratory effort without tachypnea/retractions. Breath sounds are clear and equal bilaterally. No wheezes/rales/rhonchi. Gastrointestinal:   Soft with mild generalized abdominal tenderness. Non distended. There is no CVA tenderness.  No rebound, rigidity, or guarding. Genitourinary:   deferred Musculoskeletal:   Normal range of motion in all  extremities. No joint effusions.  No lower extremity tenderness.  No edema. Neurologic:   Normal  speech and language.  Motor grossly intact. No acute focal neurologic deficits are appreciated.  Skin:    Skin is warm, dry and intact. No rash noted.  No petechiae, purpura, or bullae.  ____________________________________________    LABS (pertinent positives/negatives) (all labs ordered are listed, but only abnormal results are displayed) Labs Reviewed  COMPREHENSIVE METABOLIC PANEL - Abnormal; Notable for the following components:      Result Value   Potassium 3.0 (*)    Chloride 94 (*)    Glucose, Bld 257 (*)    BUN 40 (*)    Creatinine, Ser 1.77 (*)    Calcium 8.4 (*)    Albumin 3.3 (*)    GFR, Estimated 28 (*)    Anion gap 16 (*)    All other components within normal limits  CBC WITH DIFFERENTIAL/PLATELET - Abnormal; Notable for the following components:   WBC 13.8 (*)    Platelets 409 (*)    Neutro Abs 12.0 (*)    Abs Immature Granulocytes 0.08 (*)    All other components within normal limits  RESP PANEL BY RT-PCR (FLU A&B, COVID) [id number]  LIPASE, BLOOD  URINALYSIS, ROUTINE W REFLEX MICROSCOPIC   ____________________________________________   EKG Interpreted by me Sinus tachycardia, rate of 109. normal axis, left bundle branch block, no acute ischemic changes.   ____________________________________________    RADIOLOGY  CT ABDOMEN PELVIS WO CONTRAST  Result Date: 04/03/2021 CLINICAL DATA:  Abdominal pain, acute, nonlocalized. Nausea, vomiting, diarrhea. EXAM: CT ABDOMEN AND PELVIS WITHOUT CONTRAST TECHNIQUE: Multidetector CT imaging of the abdomen and pelvis was performed following the standard protocol without IV contrast. COMPARISON:  02/19/2018 FINDINGS: Lower chest: 9 mm ground-glass pulmonary nodule is seen within the visualized right lower lobe, incompletely evaluated on this examination, new since prior examination. Coronary artery calcification noted.  Cardiac size within normal limits. Hepatobiliary: No focal liver abnormality is seen. Status post cholecystectomy. No biliary dilatation. Pancreas: Unremarkable Spleen: Unremarkable Adrenals/Urinary Tract: The adrenal glands are unremarkable. The kidneys are normal in size and position. Vascular calcifications noted within the hila bilaterally. Parapelvic cyst noted within the right kidney. No hydronephrosis. The bladder is decompressed and is unremarkable. Stomach/Bowel: Mild sigmoid diverticulosis. Stomach, small bowel, and large bowel are otherwise unremarkable. No evidence of obstruction or focal inflammation. Appendix absent. No free intraperitoneal gas or fluid. Vascular/Lymphatic: Status post endovascular repair of an infrarenal abdominal aortic aneurysm utilizing a bifurcated stent graft. This is not well assessed on this noncontrast examination. Aneurysm sac measures 4.1 x 4.6 cm, decreased when compared to prior examination. No pathologic adenopathy within the abdomen and pelvis. Reproductive: Status post hysterectomy. No adnexal masses. Other: Tiny fat containing umbilical hernia.  Rectum unremarkable. Musculoskeletal: Osseous structures are diffusely osteopenic. No acute bone abnormality. Degenerative changes seen within the lumbar spine. No lytic or blastic bone lesions. IMPRESSION: No acute intra-abdominal pathology. No definite radiographic explanation for the patient's reported symptoms. 9 mm ground-glass pulmonary nodule within the right lower lobe, incompletely evaluated on this examination. Nonemergent dedicated CT imaging of the chest is recommended for further evaluation. Mild sigmoid diverticulosis. Interval endovascular stent graft repair of known infrarenal abdominal aortic aneurysm with interval decrease in size of aneurysm sac now measuring 4.6 cm in greatest dimension. This is not optimally assessed on this noncontrast examination. Aortic aneurysm NOS (ICD10-I71.9). Electronically Signed    By: Helyn Numbers M.D.   On: 04/03/2021 23:11    ____________________________________________   PROCEDURES Procedures  ____________________________________________  DIFFERENTIAL DIAGNOSIS   Diverticulitis, viral  gastroenteritis, dehydration, electrolyte abnormality  CLINICAL IMPRESSION / ASSESSMENT AND PLAN / ED COURSE  Medications ordered in the ED: Medications  morphine 4 MG/ML injection 4 mg (4 mg Intravenous Given 04/03/21 2233)  sodium chloride 0.9 % bolus 1,000 mL (1,000 mLs Intravenous New Bag/Given 04/03/21 2234)  ondansetron (ZOFRAN) injection 4 mg (4 mg Intravenous Given 04/03/21 2233)    Pertinent labs & imaging results that were available during my care of the patient were reviewed by me and considered in my medical decision making (see chart for details).  Eileen Mack was evaluated in Emergency Department on 04/04/2021 for the symptoms described in the history of present illness. She was evaluated in the context of the global COVID-19 pandemic, which necessitated consideration that the patient might be at risk for infection with the SARS-CoV-2 virus that causes COVID-19. Institutional protocols and algorithms that pertain to the evaluation of patients at risk for COVID-19 are in a state of rapid change based on information released by regulatory bodies including the CDC and federal and state organizations. These policies and algorithms were followed during the patient's care in the ED.   Patient presents with abdominal pain vomiting diarrhea.  Vital signs are unremarkable, elevated heart rate rate controlled atrial fibrillation.  She is not septic.  Labs show mild leukocytosis of 14,000, mild renal insufficiency with a creatinine of 1.7 and BUN of 40 consistent with dehydration.  COVID and flu negative.  CT abdomen pelvis unremarkable.    ----------------------------------------- 12:15 AM on 04/04/2021 ----------------------------------------- On reassessment,  patient states she is feeling better.  She is tolerating ice chips.  Offered admission but she wants to go home if at all possible.  Will p.o. trial while completing IV fluid bolus.  With reassuring CT and history very suspicious for viral gastroenteritis, I think she stable for discharge home if able to tolerate oral intake and stay hydrated at home.  Care signed out to Dr. Don Perking to reassess.      ____________________________________________   FINAL CLINICAL IMPRESSION(S) / ED DIAGNOSES    Final diagnoses:  Viral gastroenteritis  Dehydration     ED Discharge Orders          Ordered    famotidine (PEPCID) 20 MG tablet  2 times daily        04/04/21 0010    ondansetron (ZOFRAN-ODT) 4 MG disintegrating tablet  Every 8 hours PRN        04/04/21 0010    loperamide (IMODIUM A-D) 2 MG tablet  4 times daily PRN        04/04/21 0010            Portions of this note were generated with dragon dictation software. Dictation errors may occur despite best attempts at proofreading.    Sharman Cheek, MD 04/04/21 [id number]

## 2021-04-04 NOTE — H&P (Signed)
History and Physical    Eileen Mack [id number] DOB: 02-14-1936 DOA: 04/03/2021  Referring MD/NP/PA:   PCP: Virginia Crews, MD   Patient coming from:  The patient is coming from home.  At baseline, pt is independent for most of ADL.        Chief Complaint: Nausea, vomiting, diarrhea and abdominal pain  HPI: Eileen Mack is a 85 y.o. female with medical history significant of hypertension, hyperlipidemia, prediabetes, COPD, GERD, hypothyroidism, depression, CKD-3A, tobacco abuse, CAD, atrial fibrillation on anticoagulants (s/p ablation), AAA, varicose vein of legs, iron deficiency anemia, left bundle blockage, who presents with nausea vomiting, diarrhea and abdominal pain.  Patient states that her symptoms has been going on for more than 4 days, including nausea vomiting, diarrhea and abdominal pain.  She states that she has multiple episodes of watery diarrhea each day.  She also has multiple nonbilious nonbloody vomiting each day.  Her abdominal pain is diffuse, cramping-like pain, nonradiating, mild to moderate, currently abdominal pain is mild.  No fever or chills.  Patient has mild dry cough, mild shortness breath which she attributes to COPD.  No chest pain.  No symptoms of UTI.  Patient was initially found to have A. fib with RVR with heart rate up to 135, which has converted in ED.  Currently heart rate is at 90s.  ED Course: pt was found to have WBC 13.8, negative COVID PCR, AKI with creatinine 1.77, BUN 40 (baseline creatinine 0.97 on 02/09/2021), lipase 30, temperature normal, blood pressure 165/85, heart rate 135, 99, RR 22, oxygen saturation 93-97% on room air.  Patient is placed on MedSurg bed for observation.  Review of Systems:   General: no fevers, chills, no body weight gain, has poor appetite, has fatigue HEENT: no blurry vision, hearing changes or sore throat Respiratory: no dyspnea, coughing, wheezing CV: no chest pain, no palpitations GI:  has nausea, vomiting, abdominal pain, diarrhea,  GU: no dysuria, burning on urination, increased urinary frequency, hematuria  Ext: no leg edema Neuro: no unilateral weakness, numbness, or tingling, no vision change or hearing loss Skin: no rash, no skin tear. MSK: No muscle spasm, no deformity, no limitation of range of movement in spin Heme: No easy bruising.  Travel history: No recent long distant travel.  Allergy:  Allergies  Allergen Reactions   Iodinated Diagnostic Agents Hives, Itching and Rash    She states she has to be premedicated. SPM   Sulfa Antibiotics Rash    Past Medical History:  Diagnosis Date   AAA (abdominal aortic aneurysm)    Allergy    Anemia    Atrial fibrillation ([place])    Blood transfusion without reported diagnosis    CAD (coronary artery disease)    s/p PCI to LAD & RCA   COPD (chronic obstructive pulmonary disease) (HCC)    Depression    Emphysema of lung (HCC)    GERD (gastroesophageal reflux disease)    History of blood clots    Hyperlipidemia    Hypertension    Personal history of tobacco use, presenting hazards to health 07/01/2015   Renal disorder    Thyroid disease     Past Surgical History:  Procedure Laterality Date   ABDOMINAL HYSTERECTOMY     APPENDECTOMY     CARDIAC SURGERY     CHOLECYSTECTOMY     COLONOSCOPY WITH PROPOFOL Bilateral 08/01/2015   Procedure: COLONOSCOPY WITH PROPOFOL;  Surgeon: Manya Silvas, MD;  Location: [facility];  Service: Endoscopy;  Laterality: Bilateral;   ENDOVASCULAR REPAIR/STENT GRAFT N/A 02/21/2018   Procedure: ENDOVASCULAR REPAIR/STENT GRAFT;  Surgeon: Renford Dills, MD;  Location: [facility] IN[facility]ASIVE [facility];  Service: Cardiovascular;  Laterality: N/A;   ESOPHAGOGASTRODUODENOSCOPY N/A 07/31/2015   Procedure: ESOPHAGOGASTRODUODENOSCOPY (EGD);  Surgeon: Scot Jun, MD;  Location: [facility] ENDOSCOPY;  Service: Endoscopy;  Laterality: N/A;   EYE SURGERY     LEFT HEART CATH AND CORONARY ANGIOGRAPHY  N/A 04/03/2020   Procedure: LEFT HEART CATH AND CORONARY ANGIOGRAPHY possible PCI and stent;  Surgeon: Alwyn Pea, MD;  Location: [facility] INVASIVE CV LAB;  Service: Cardiovascular;  Laterality: N/A;    Social History:  reports that she has been smoking cigarettes. She has a 62.00 pack-year smoking history. She has never used smokeless tobacco. She reports that she does not currently use alcohol. She reports that she does not use drugs.  Family History:  Family History  Problem Relation Age of Onset   Heart attack Father 51   Brain cancer Sister    Lung cancer Sister    Breast cancer Neg Hx    Colon cancer Neg Hx      Prior to Admission medications   Medication Sig Start Date End Date Taking? Authorizing Provider  acetaminophen (TYLENOL) 650 MG CR tablet Take 1,300 mg by mouth every 8 (eight) hours.   Yes [provider]  albuterol (VENTOLIN HFA) 108 (90 Base) MCG/ACT inhaler Inhale 2 puffs into the lungs every 6 (six) hours as needed for wheezing or shortness of breath. 05/04/20  Yes Bacigalupo, Marzella Schlein, MD  amLODipine (NORVASC) 5 MG tablet Take 1 tablet (5 mg total) by mouth daily. 04/01/21  Yes Jacky Kindle, FNP  aspirin EC 81 MG tablet Take 81 mg by mouth daily.   Yes [provider]  atorvastatin (LIPITOR) 40 MG tablet Take 1 tablet (40 mg total) by mouth daily. 04/01/21  Yes Merita Norton T, FNP  buPROPion (WELLBUTRIN XL) 150 MG 24 hr tablet Take 1 tablet (150 mg total) by mouth daily. 04/01/21  Yes Jacky Kindle, FNP  carvedilol (COREG) 25 MG tablet Take 1 tablet (25 mg total) by mouth 2 (two) times daily with a meal. 04/01/21  Yes Jacky Kindle, FNP  cloNIDine (CATAPRES) 0.2 MG tablet Take 1 tablet (0.2 mg total) by mouth 2 (two) times daily. 02/16/21  Yes Bacigalupo, Marzella Schlein, MD  famotidine (PEPCID) 20 MG tablet Take 1 tablet (20 mg total) by mouth 2 (two) times daily. 04/04/21  Yes Sharman Cheek, MD  ferrous sulfate 325 (65 FE) MG tablet Take 1 tablet (325  mg total) by mouth daily with breakfast. 08/01/15  Yes Mody, Sital, MD  fluticasone (FLONASE) 50 MCG/ACT nasal spray Place into the nose. 08/15/19  Yes [provider]  fluticasone furoate-vilanterol (BREO ELLIPTA) 200-25 MCG/ACT AEPB INHALE 1 PUFF IN THE MORNING 03/19/21  Yes Bacigalupo, Marzella Schlein, MD  gabapentin (NEURONTIN) 600 MG tablet Take 1 tablet (600 mg total) by mouth 3 (three) times daily. 04/01/21  Yes Jacky Kindle, FNP  gemfibrozil (LOPID) 600 MG tablet Take 1 tablet (600 mg total) by mouth 2 (two) times daily before a meal. 04/01/21  Yes Jacky Kindle, FNP  isosorbide mononitrate (IMDUR) 30 MG 24 hr tablet Take 1 tablet (30 mg total) by mouth daily. 04/01/21  Yes Jacky Kindle, FNP  levothyroxine (EUTHYROX) 112 MCG tablet Take 1 tablet (112 mcg total) by mouth daily. OK for change in manufacturer 04/01/21  Yes Merita Norton  T, FNP  lidocaine (LIDODERM) 5 % Place 1 patch onto the skin every 12 (twelve) hours. Remove & Discard patch within 12 hours or as directed by MD 02/09/21 02/09/22 Yes Vladimir Crofts, MD  loperamide (IMODIUM A-D) 2 MG tablet Take 2 tablets (4 mg total) by mouth 4 (four) times daily as needed for diarrhea or loose stools. 04/04/21  Yes Carrie Mew, MD  Multiple Vitamins-Minerals (MULTIVITAMIN GUMMIES ADULT PO) Take 1 each by mouth daily.   Yes [provider]  omeprazole (PRILOSEC) 20 MG capsule Take 1 capsule (20 mg total) by mouth daily. 04/01/21  Yes Gwyneth Sprout, FNP  ondansetron (ZOFRAN-ODT) 4 MG disintegrating tablet Take 1 tablet (4 mg total) by mouth every 8 (eight) hours as needed for nausea or vomiting. 04/04/21  Yes Carrie Mew, MD  Venlafaxine HCl 225 MG TB24 Take 1 tablet (225 mg total) by mouth daily. 04/01/21  Yes Gwyneth Sprout, FNP  nitroGLYCERIN (NITROSTAT) 0.4 MG SL tablet Place 1 tablet (0.4 mg total) under the tongue every 5 (five) minutes as needed for chest pain. 09/24/20   Virginia Crews, MD    Physical Exam: Vitals:    04/04/21 1050 04/04/21 1251 04/04/21 1354 04/04/21 1532  BP:  138/72 132/66 (!) 115/52  Pulse:  84 89 75  Resp:  17 16 15   Temp:  98 F (36.7 C) (!) 97.1 F (36.2 C) 98.5 F (36.9 C)  TempSrc:  Oral    SpO2:  98% 96% 97%  Weight: 68 kg     Height: 5\' 7"  (1.702 m)      General: Not in acute distress.  Dry mucous membrane HEENT:       Eyes: PERRL, EOMI, no scleral icterus.       ENT: No discharge from the ears and nose, no pharynx injection, no tonsillar enlargement.        Neck: No JVD, no bruit, no mass felt. Heme: No neck lymph node enlargement. Cardiac: S1/S2, RRR, No murmurs, No gallops or rubs. Respiratory: No rales, wheezing, rhonchi or rubs. GI: Soft, nondistended, has mild diffuse tenderness, no rebound pain, no organomegaly, BS present. GU: No hematuria Ext: No pitting leg edema bilaterally. 1+DP/PT pulse bilaterally. Musculoskeletal: No joint deformities, No joint redness or warmth, no limitation of ROM in spin. Skin: No rashes.  Neuro: Alert, oriented X3, cranial nerves II-XII grossly intact, moves all extremities normally.  Psych: Patient is not psychotic, no suicidal or hemocidal ideation.  Labs on Admission: I have personally reviewed following labs and imaging studies  CBC: Recent Labs  Lab 04/03/21 2230  WBC 13.8*  NEUTROABS 12.0*  HGB 13.5  HCT 40.2  MCV 86.8  PLT [id number]*   Basic Metabolic Panel: Recent Labs  Lab 04/03/21 2230  NA 135  K 3.0*  CL 94*  CO2 25  GLUCOSE 257*  BUN 40*  CREATININE 1.77*  CALCIUM 8.4*   GFR: Estimated Creatinine Clearance: 23.8 mL/min (A) (by C-G formula based on SCr of 1.77 mg/dL (H)). Liver Function Tests: Recent Labs  Lab 04/03/21 2230  AST 37  ALT 20  ALKPHOS 117  BILITOT 0.7  PROT 7.6  ALBUMIN 3.3*   Recent Labs  Lab 04/03/21 2230  LIPASE 30   No results for input(s): AMMONIA in the last 168 hours. Coagulation Profile: Recent Labs  Lab 04/04/21 1028  INR 1.3*   Cardiac Enzymes: No results  for input(s): CKTOTAL, CKMB, CKMBINDEX, TROPONINI in the last 168 hours. BNP (last 3 results) No  results for input(s): PROBNP in the last 8760 hours. HbA1C: No results for input(s): HGBA1C in the last 72 hours. CBG: Recent Labs  Lab 04/04/21 1148 04/04/21 1648  GLUCAP 180* 131*   Lipid Profile: No results for input(s): CHOL, HDL, LDLCALC, TRIG, CHOLHDL, LDLDIRECT in the last 72 hours. Thyroid Function Tests: No results for input(s): TSH, T4TOTAL, FREET4, T3FREE, THYROIDAB in the last 72 hours. Anemia Panel: No results for input(s): VITAMINB12, FOLATE, FERRITIN, TIBC, IRON, RETICCTPCT in the last 72 hours. Urine analysis:    Component Value Date/Time   COLORURINE STRAW (A) 01/05/2019 2345   APPEARANCEUR CLEAR (A) 01/05/2019 2345   LABSPEC 1.008 01/05/2019 2345   PHURINE 6.0 01/05/2019 2345   GLUCOSEU NEGATIVE 01/05/2019 2345   HGBUR NEGATIVE 01/05/2019 2345   BILIRUBINUR NEGATIVE 01/05/2019 2345   KETONESUR NEGATIVE 01/05/2019 2345   PROTEINUR NEGATIVE 01/05/2019 2345   NITRITE NEGATIVE 01/05/2019 2345   LEUKOCYTESUR NEGATIVE 01/05/2019 2345   Sepsis Labs: @LABRCNTIP (procalcitonin:4,lacticidven:4) ) Recent Results (from the past 240 hour(s))  Resp Panel by RT-PCR (Flu A&B, Covid) Nasopharyngeal Swab     Status: None   Collection Time: 04/03/21 10:38 PM   Specimen: Nasopharyngeal Swab; Nasopharyngeal(NP) swabs in vial transport medium  Result Value Ref Range Status   SARS Coronavirus 2 by RT PCR NEGATIVE NEGATIVE Final    Comment: (NOTE) SARS-CoV-2 target nucleic acids are NOT DETECTED.  The SARS-CoV-2 RNA is generally detectable in upper respiratory specimens during the acute phase of infection. The lowest concentration of SARS-CoV-2 viral copies this assay can detect is 138 copies/mL. A negative result does not preclude SARS-Cov-2 infection and should not be used as the sole basis for treatment or other patient management decisions. A negative result may occur with   improper specimen collection/handling, submission of specimen other than nasopharyngeal swab, presence of viral mutation(s) within the areas targeted by this assay, and inadequate number of viral copies(<138 copies/mL). A negative result must be combined with clinical observations, patient history, and epidemiological information. The expected result is Negative.  Fact Sheet for Patients:  EntrepreneurPulse.com.au  Fact Sheet for Healthcare Providers:  IncredibleEmployment.be  This test is no t yet approved or cleared by the [place] FDA and  has been authorized for detection and/or diagnosis of SARS-CoV-2 by FDA under an Emergency Use Authorization (EUA). This EUA will remain  in effect (meaning this test can be used) for the duration of the COVID-19 declaration under Section 564(b)(1) of the Act, 21 U.S.C.section 360bbb-3(b)(1), unless the authorization is terminated  or revoked sooner.       Influenza A by PCR NEGATIVE NEGATIVE Final   Influenza B by PCR NEGATIVE NEGATIVE Final    Comment: (NOTE) The Xpert Xpress SARS-CoV-2/FLU/RSV plus assay is intended as an aid in the diagnosis of influenza from Nasopharyngeal swab specimens and should not be used as a sole basis for treatment. Nasal washings and aspirates are unacceptable for Xpert Xpress SARS-CoV-2/FLU/RSV testing.  Fact Sheet for Patients: EntrepreneurPulse.com.au  Fact Sheet for Healthcare Providers: IncredibleEmployment.be  This test is not yet approved or cleared by the [place] FDA and has been authorized for detection and/or diagnosis of SARS-CoV-2 by FDA under an Emergency Use Authorization (EUA). This EUA will remain in effect (meaning this test can be used) for the duration of the COVID-19 declaration under Section 564(b)(1) of the Act, 21 U.S.C. section 360bbb-3(b)(1), unless the authorization is terminated  or revoked.  Performed at [facility], [address], [place], [place] [address]  Radiological Exams on Admission: CT ABDOMEN PELVIS WO CONTRAST  Result Date: 04/03/2021 CLINICAL DATA:  Abdominal pain, acute, nonlocalized. Nausea, vomiting, diarrhea. EXAM: CT ABDOMEN AND PELVIS WITHOUT CONTRAST TECHNIQUE: Multidetector CT imaging of the abdomen and pelvis was performed following the standard protocol without IV contrast. COMPARISON:  02/19/2018 FINDINGS: Lower chest: 9 mm ground-glass pulmonary nodule is seen within the visualized right lower lobe, incompletely evaluated on this examination, new since prior examination. Coronary artery calcification noted. Cardiac size within normal limits. Hepatobiliary: No focal liver abnormality is seen. Status post cholecystectomy. No biliary dilatation. Pancreas: Unremarkable Spleen: Unremarkable Adrenals/Urinary Tract: The adrenal glands are unremarkable. The kidneys are normal in size and position. Vascular calcifications noted within the hila bilaterally. Parapelvic cyst noted within the right kidney. No hydronephrosis. The bladder is decompressed and is unremarkable. Stomach/Bowel: Mild sigmoid diverticulosis. Stomach, small bowel, and large bowel are otherwise unremarkable. No evidence of obstruction or focal inflammation. Appendix absent. No free intraperitoneal gas or fluid. Vascular/Lymphatic: Status post endovascular repair of an infrarenal abdominal aortic aneurysm utilizing a bifurcated stent graft. This is not well assessed on this noncontrast examination. Aneurysm sac measures 4.1 x 4.6 cm, decreased when compared to prior examination. No pathologic adenopathy within the abdomen and pelvis. Reproductive: Status post hysterectomy. No adnexal masses. Other: Tiny fat containing umbilical hernia.  Rectum unremarkable. Musculoskeletal: Osseous structures are diffusely osteopenic. No acute bone abnormality. Degenerative changes seen within the  lumbar spine. No lytic or blastic bone lesions. IMPRESSION: No acute intra-abdominal pathology. No definite radiographic explanation for the patient's reported symptoms. 9 mm ground-glass pulmonary nodule within the right lower lobe, incompletely evaluated on this examination. Nonemergent dedicated CT imaging of the chest is recommended for further evaluation. Mild sigmoid diverticulosis. Interval endovascular stent graft repair of known infrarenal abdominal aortic aneurysm with interval decrease in size of aneurysm sac now measuring 4.6 cm in greatest dimension. This is not optimally assessed on this noncontrast examination. Aortic aneurysm NOS (ICD10-I71.9). Electronically Signed   By: Fidela Salisbury M.D.   On: 04/03/2021 23:11     EKG: I have personally reviewed.  Atrial fibrillation with RVR, QTC 529, LAD, poor R wave progression.  The repeated EKG showed sinus rhythm, PAC, left bundle blockage which is old.   Assessment/Plan Principal Problem:   Acute gastroenteritis Active Problems:   Iron deficiency anemia due to chronic blood loss   Atrial fibrillation with RVR (HCC)   Hypothyroidism   GERD (gastroesophageal reflux disease)   Tobacco abuse   Prediabetes   Hypertension   Hyperlipidemia   COPD (chronic obstructive pulmonary disease) (HCC)   Depression   Acute renal failure superimposed on stage 3a chronic kidney disease (HCC)   SIRS (systemic inflammatory response syndrome) (HCC)   Hypokalemia  Acute gastroenteritis: Etiology is not clear.  Patient does not have fever, but has leukocytosis.  She meets criteria for SIRS.  Will start antibiotics. -Placed on MedSurg bed for position -IV Zosyn -Follow-up of blood culture -Follow-up C. difficile and GI pathogen panel -IVF: 2L of NS bolus in ED, followed by 75 cc/h   SIRS (systemic inflammatory response syndrome) [facility]): Patient meets criteria for SIRS with WBC 13.8, tachycardia with heart rate up to 135, tachypnea with RR 22.  Lactic  acid is elevated 3.8 --> 2.2.  Since no clear organism identified for infection, will treat patient as SIRS now.  If specific infection source identified, will change to sepsis. -will get Procalcitonin and trend lactic acid levels per sepsis protocol. -IVF: 2L  of NS bolus in ED, followed by 75 cc/h   Iron deficiency anemia due to chronic blood loss: Hemoglobin stable, 13.5 -Continue iron supplement  Atrial fibrillation with RVR ([place]): Initially heart rate up to 135, converted to sinus rhythm currently.  Heart rate 75 now.  Patient is not taking anticoagulants currently -Continue Coreg  Hypothyroidism -Synthroid  GERD (gastroesophageal reflux disease) -Protonix  Tobacco abuse -Nicotine patch  Prediabetes: Recent A1c 6.4, patient's not taking medications currently.  Blood sugar elevated, 257 -Sliding scale insulin  Hypertension -Hold amlodipine, clonidine due to high risk of developing hypotension -Continue Coreg -IV hydralazine as needed  Hyperlipidemia -Lopid, Lipitor  COPD (chronic obstructive pulmonary disease) (HCC): Stable -Bronchodilators  Depression -Continue home medications  Acute renal failure superimposed on stage 3a chronic kidney disease ([place]): Likely due to dehydration. -Avoid using renal toxic medications -Fluid as above  Hypokalemia: Potassium 3.0 -Repleted potassium -Check magnesium level -Patient was already given 2 g of magnesium sulfate in ED          DVT ppx:  SQ Lovenox Code Status: DNR per her granddaughter Family Communication: Yes, patient's granddaughter by phone Disposition Plan:  Anticipate discharge back to previous environment Consults called:  none Admission status and Level of care: Telemetry Medical:  for obs    Status is: Observation  The patient remains OBS appropriate and will d/c before 2 midnights.         Date of Service 04/04/2021    Ivor Costa [facility]   If 7PM-7AM, please contact  night-coverage [email or web address] 04/04/2021, 6:13 PM

## 2021-04-04 NOTE — ED Notes (Signed)
Pt tolerating PO fluids and crackers

## 2021-04-04 NOTE — ED Notes (Signed)
Ambulatory to toilet. Voided, but missed collection tray so unable to send urine to lab at this time.

## 2021-04-04 NOTE — Consult Note (Signed)
Pharmacy Antibiotic Note  Eileen Mack is a 85 y.o. female admitted on 04/03/2021 with  Acute Gastroenteritis .  Pharmacy has been consulted for Zosyn dosing.  Plan: Zosyn 3.375g IV q8h (4 hour infusion).  Height: 5\' 7"  (170.2 cm) Weight: 68 kg (150 lb) IBW/kg (Calculated) : 61.6  Temp (24hrs), Avg:98 F (36.7 C), Min:97.1 F (36.2 C), Max:98.7 F (37.1 C)  Recent Labs  Lab 04/03/21 2230 04/04/21 1028 04/04/21 1359 04/04/21 1831 04/04/21 2058  WBC 13.8*  --   --   --   --   CREATININE 1.77*  --   --   --   --   LATICACIDVEN  --  3.8* 2.2* 2.0* 1.8    Estimated Creatinine Clearance: 23.8 mL/min (A) (by C-G formula based on SCr of 1.77 mg/dL (H)).    Allergies  Allergen Reactions   Iodinated Diagnostic Agents Hives, Itching and Rash    She states she has to be premedicated. SPM   Sulfa Antibiotics Rash    Antimicrobials this admission: 12/4 Zosyn >>   Dose adjustments this admission: none  Microbiology results: 12/4 BCx: pending 12/4 GI panel: pending  Thank you for allowing pharmacy to be a part of this patient's care.  Zhara Gieske Rodriguez-Guzman PharmD, BCPS 04/04/2021 10:11 PM

## 2021-04-04 NOTE — ED Provider Notes (Signed)
Accepted care of this patient from Dr. Scotty Court at shift change.  Patient presented with 4 days of abdominal pain, vomiting and diarrhea.  CT showing no acute findings.  Viral swab was negative.  Patient wanted to go home and the plan was to IV hydrate her and p.o. challenge for discharge.  Patient then went into A. fib with ventricular rate in the 120s.  Patient has a history of A. fib and underwent 2 ablations with the last one in 2011 per review of patient's cardiologist note from March 2022.  Since then patient has been on normal sinus rhythm and she was taken off of her blood thinners.  Review of labs here show the patient was hypokalemic.  Also showed the patient had AKI.  She was given 2 L of fluid, 2 g of IV magnesium, 40 mEq of p.o. potassium and 10 of IV potassium.  Patient then converted back into normal sinus rhythm.  She continues to have pretty severe nausea although she is no longer vomiting.  Therefore we will consult the hospitalist service for admission.  Her COVID and flu are negative.  CT found a an indeterminate 9 mm groundglass pulmonary nodule which was discussed with patient.  She will need a nonemergent CT imaging      I have personally reviewed the images performed during this visit and I agree with the Radiologist's read.   Interpretation by Radiologist:  CT ABDOMEN PELVIS WO CONTRAST  Result Date: 04/03/2021 CLINICAL DATA:  Abdominal pain, acute, nonlocalized. Nausea, vomiting, diarrhea. EXAM: CT ABDOMEN AND PELVIS WITHOUT CONTRAST TECHNIQUE: Multidetector CT imaging of the abdomen and pelvis was performed following the standard protocol without IV contrast. COMPARISON:  02/19/2018 FINDINGS: Lower chest: 9 mm ground-glass pulmonary nodule is seen within the visualized right lower lobe, incompletely evaluated on this examination, new since prior examination. Coronary artery calcification noted. Cardiac size within normal limits. Hepatobiliary: No focal liver abnormality is  seen. Status post cholecystectomy. No biliary dilatation. Pancreas: Unremarkable Spleen: Unremarkable Adrenals/Urinary Tract: The adrenal glands are unremarkable. The kidneys are normal in size and position. Vascular calcifications noted within the hila bilaterally. Parapelvic cyst noted within the right kidney. No hydronephrosis. The bladder is decompressed and is unremarkable. Stomach/Bowel: Mild sigmoid diverticulosis. Stomach, small bowel, and large bowel are otherwise unremarkable. No evidence of obstruction or focal inflammation. Appendix absent. No free intraperitoneal gas or fluid. Vascular/Lymphatic: Status post endovascular repair of an infrarenal abdominal aortic aneurysm utilizing a bifurcated stent graft. This is not well assessed on this noncontrast examination. Aneurysm sac measures 4.1 x 4.6 cm, decreased when compared to prior examination. No pathologic adenopathy within the abdomen and pelvis. Reproductive: Status post hysterectomy. No adnexal masses. Other: Tiny fat containing umbilical hernia.  Rectum unremarkable. Musculoskeletal: Osseous structures are diffusely osteopenic. No acute bone abnormality. Degenerative changes seen within the lumbar spine. No lytic or blastic bone lesions. IMPRESSION: No acute intra-abdominal pathology. No definite radiographic explanation for the patient's reported symptoms. 9 mm ground-glass pulmonary nodule within the right lower lobe, incompletely evaluated on this examination. Nonemergent dedicated CT imaging of the chest is recommended for further evaluation. Mild sigmoid diverticulosis. Interval endovascular stent graft repair of known infrarenal abdominal aortic aneurysm with interval decrease in size of aneurysm sac now measuring 4.6 cm in greatest dimension. This is not optimally assessed on this noncontrast examination. Aortic aneurysm NOS (ICD10-I71.9). Electronically Signed   By: Helyn Numbers M.D.   On: 04/03/2021 23:11      Nita Sickle,  MD  04/04/21 0534  

## 2021-04-04 NOTE — Discharge Instructions (Addendum)
Please follow up with your primary doctor in 1 week or less to check your potassium and blood pressure. We have stopped several of your blood pressure medications this hospitalization due to your blood pressures being on the low side. You may need them added back on after you have recovered and are feeling better.

## 2021-04-05 DIAGNOSIS — Z888 Allergy status to other drugs, medicaments and biological substances status: Secondary | ICD-10-CM | POA: Diagnosis not present

## 2021-04-05 DIAGNOSIS — E860 Dehydration: Secondary | ICD-10-CM | POA: Diagnosis present

## 2021-04-05 DIAGNOSIS — R0603 Acute respiratory distress: Secondary | ICD-10-CM | POA: Diagnosis not present

## 2021-04-05 DIAGNOSIS — K9089 Other intestinal malabsorption: Secondary | ICD-10-CM | POA: Diagnosis not present

## 2021-04-05 DIAGNOSIS — E039 Hypothyroidism, unspecified: Secondary | ICD-10-CM | POA: Diagnosis present

## 2021-04-05 DIAGNOSIS — I2510 Atherosclerotic heart disease of native coronary artery without angina pectoris: Secondary | ICD-10-CM | POA: Diagnosis present

## 2021-04-05 DIAGNOSIS — N1831 Chronic kidney disease, stage 3a: Secondary | ICD-10-CM | POA: Diagnosis present

## 2021-04-05 DIAGNOSIS — K219 Gastro-esophageal reflux disease without esophagitis: Secondary | ICD-10-CM | POA: Diagnosis present

## 2021-04-05 DIAGNOSIS — J439 Emphysema, unspecified: Secondary | ICD-10-CM | POA: Diagnosis present

## 2021-04-05 DIAGNOSIS — Z66 Do not resuscitate: Secondary | ICD-10-CM | POA: Diagnosis present

## 2021-04-05 DIAGNOSIS — F32A Depression, unspecified: Secondary | ICD-10-CM | POA: Diagnosis present

## 2021-04-05 DIAGNOSIS — I129 Hypertensive chronic kidney disease with stage 1 through stage 4 chronic kidney disease, or unspecified chronic kidney disease: Secondary | ICD-10-CM | POA: Diagnosis present

## 2021-04-05 DIAGNOSIS — A084 Viral intestinal infection, unspecified: Secondary | ICD-10-CM | POA: Diagnosis not present

## 2021-04-05 DIAGNOSIS — I480 Paroxysmal atrial fibrillation: Secondary | ICD-10-CM | POA: Diagnosis present

## 2021-04-05 DIAGNOSIS — I714 Abdominal aortic aneurysm, without rupture, unspecified: Secondary | ICD-10-CM | POA: Diagnosis present

## 2021-04-05 DIAGNOSIS — E785 Hyperlipidemia, unspecified: Secondary | ICD-10-CM | POA: Diagnosis present

## 2021-04-05 DIAGNOSIS — I10 Essential (primary) hypertension: Secondary | ICD-10-CM | POA: Diagnosis not present

## 2021-04-05 DIAGNOSIS — A0839 Other viral enteritis: Secondary | ICD-10-CM | POA: Diagnosis present

## 2021-04-05 DIAGNOSIS — Z7901 Long term (current) use of anticoagulants: Secondary | ICD-10-CM | POA: Diagnosis not present

## 2021-04-05 DIAGNOSIS — D500 Iron deficiency anemia secondary to blood loss (chronic): Secondary | ICD-10-CM | POA: Diagnosis present

## 2021-04-05 DIAGNOSIS — R7303 Prediabetes: Secondary | ICD-10-CM | POA: Diagnosis present

## 2021-04-05 DIAGNOSIS — Z20822 Contact with and (suspected) exposure to covid-19: Secondary | ICD-10-CM | POA: Diagnosis present

## 2021-04-05 DIAGNOSIS — Z882 Allergy status to sulfonamides status: Secondary | ICD-10-CM | POA: Diagnosis not present

## 2021-04-05 DIAGNOSIS — E876 Hypokalemia: Secondary | ICD-10-CM | POA: Diagnosis present

## 2021-04-05 DIAGNOSIS — N179 Acute kidney failure, unspecified: Secondary | ICD-10-CM | POA: Diagnosis present

## 2021-04-05 DIAGNOSIS — F17210 Nicotine dependence, cigarettes, uncomplicated: Secondary | ICD-10-CM | POA: Diagnosis present

## 2021-04-05 DIAGNOSIS — K529 Noninfective gastroenteritis and colitis, unspecified: Secondary | ICD-10-CM | POA: Diagnosis not present

## 2021-04-05 LAB — GLUCOSE, CAPILLARY
Glucose-Capillary: 112 mg/dL — ABNORMAL HIGH (ref 70–99)
Glucose-Capillary: 100 mg/dL — ABNORMAL HIGH (ref 70–99)
Glucose-Capillary: 229 mg/dL — ABNORMAL HIGH (ref 70–99)
Glucose-Capillary: 120 mg/dL — ABNORMAL HIGH (ref 70–99)
Glucose-Capillary: 88 mg/dL (ref 70–99)
Glucose-Capillary: 192 mg/dL — ABNORMAL HIGH (ref 70–99)

## 2021-04-05 LAB — MAGNESIUM: Magnesium: 1 mg/dL — ABNORMAL LOW (ref 1.7–2.4)

## 2021-04-05 LAB — BASIC METABOLIC PANEL
Anion gap: 8 (ref 5–15)
BUN: 39 mg/dL — ABNORMAL HIGH (ref 8–23)
CO2: 24 mmol/L (ref 22–32)
Calcium: 7.1 mg/dL — ABNORMAL LOW (ref 8.9–10.3)
Chloride: 105 mmol/L (ref 98–111)
Creatinine, Ser: 1.1 mg/dL — ABNORMAL HIGH (ref 0.44–1.00)
GFR, Estimated: 50 mL/min — ABNORMAL LOW (ref >60)
Glucose, Bld: 126 mg/dL — ABNORMAL HIGH (ref 70–99)
Potassium: 2.9 mmol/L — ABNORMAL LOW (ref 3.5–5.1)
Sodium: 137 mmol/L (ref 135–145)

## 2021-04-05 LAB — CBC
HCT: 29.9 % — ABNORMAL LOW (ref 36.0–46.0)
Hemoglobin: 9.8 g/dL — ABNORMAL LOW (ref 12.0–15.0)
MCH: 29 pg (ref 26.0–34.0)
MCHC: 32.8 g/dL (ref 30.0–36.0)
MCV: 88.5 fL (ref 80.0–100.0)
Platelets: 252 10*3/uL (ref 150–400)
RBC: 3.38 MIL/uL — ABNORMAL LOW (ref 3.87–5.11)
RDW: 14.3 % (ref 11.5–15.5)
WBC: 6.5 10*3/uL (ref 4.0–10.5)
nRBC: 0 % (ref 0.0–0.2)

## 2021-04-05 LAB — GASTROINTESTINAL PANEL BY PCR, STOOL (REPLACES STOOL CULTURE)

## 2021-04-05 MED ORDER — POTASSIUM CHLORIDE CRYS ER 20 MEQ PO TBCR
40.0000 meq | EXTENDED_RELEASE_TABLET | Freq: Two times a day (BID) | ORAL | Status: AC
  Administered 2021-04-05 (×2): 40 meq via ORAL
  Filled 2021-04-05 (×2): qty 2

## 2021-04-05 MED ORDER — MAGNESIUM SULFATE 4 GM/100ML IV SOLN
4.0000 g | Freq: Once | INTRAVENOUS | Status: AC
  Administered 2021-04-05: 4 g via INTRAVENOUS
  Filled 2021-04-05: qty 100

## 2021-04-05 MED ORDER — ENOXAPARIN SODIUM 40 MG/0.4ML IJ SOSY
40.0000 mg | PREFILLED_SYRINGE | INTRAMUSCULAR | Status: DC
  Administered 2021-04-06 – 2021-04-07 (×2): 40 mg via SUBCUTANEOUS
  Filled 2021-04-05 (×2): qty 0.4

## 2021-04-05 NOTE — Progress Notes (Signed)
PT Cancellation Note  Patient Details Name: Eileen Mack MRN: [id number] DOB: 1936/03/21   Cancelled Treatment:    Reason Eval/Treat Not Completed: Medical issues which prohibited therapy (Per chart review, magnesium ion quite low and appears downtrending (1.0 this date). Will defer evaluation to later date/time until more appropriate.)  9:34 AM, 04/05/21 Rosamaria Lints, PT, DPT Physical Therapist - [facility]  [phone] (ASCOM)    [name] C 04/05/2021, 9:34 AM

## 2021-04-05 NOTE — Progress Notes (Signed)
PROGRESS NOTE    Eileen Mack  VOH:[id number] DOB: 05-30-1935 DOA: 04/03/2021 PCP: Erasmo Downer, MD    Assessment & Plan:   Principal Problem:   Acute gastroenteritis Active Problems:   Iron deficiency anemia due to chronic blood loss   Atrial fibrillation with RVR (HCC)   Hypothyroidism   GERD (gastroesophageal reflux disease)   Tobacco abuse   Prediabetes   Hypertension   Hyperlipidemia   COPD (chronic obstructive pulmonary disease) (HCC)   Depression   Acute renal failure superimposed on stage 3a chronic kidney disease (HCC)   SIRS (systemic inflammatory response syndrome) (HCC)   Hypokalemia  Acute gastroenteritis: etiology unclear.Continue on IV zosyn. GI PCR panel & c. diff is ordered. Blood cxs are pending   SIRS: meets criteria for SIRS with leukocytosis, tachycardia, tachypnea, lactic acid is elevated.  If specific infection source identified, will change to sepsis. Continue on IVFs   Hypomagnesemia: MgSO4 given  Hypokalemia: potassium given    IDA due to chronic blood loss: continue on iron supplement    Likely PAF w/ RVR: continue on coreg. Not on anticoagulation    Hypothyroidism: continue on home dose of levothyroxine   GERD: continue on PPI    Tobacco abuse: nicotine patch to prevent w/drawl. Smoking cessation counseling   PreDM: Recent HbA1c 6.4. Continue on SSI w/ accuchecks    HTN: continue on coreg. Continue to hold amlodipine, clonidine    HLD: continue on gemfibrozil, statin    COPD: w/o exacerbation. Continue on bronchodilators    Depression: severity unknown. Continue on home dose of bupropion  AKI on CKDIIIa: likely due to dehydration. Continue on IVFs   DVT prophylaxis: lovenox  Code Status: DNR Family Communication:  Disposition Plan: depends on PT/OT recs   Level of care: Telemetry Medical  Status is: Observation  The patient remains OBS appropriate and will d/c before 2 midnights.     Consultants:  GI    Procedures:   Antimicrobials:   Subjective: Pt c/o abd pain and diarrhea  Objective: Vitals:   04/04/21 1532 04/04/21 1939 04/05/21 0338 04/05/21 0720  BP: (!) 115/52 (!) 111/47 117/60 (!) 135/53  Pulse: 75 73 65 63  Resp: 15 16 19 16   Temp: 98.5 F (36.9 C) 98.7 F (37.1 C) 97.6 F (36.4 C) (!) 97.4 F (36.3 C)  TempSrc:    Oral  SpO2: 97% 94% 97% 97%  Weight:      Height:       No intake or output data in the 24 hours ending 04/05/21 0753 Filed Weights   04/03/21 2219 04/04/21 1050  Weight: 68 kg 68 kg    Examination:  General exam: Appears calm and comfortable  Respiratory system: Clear to auscultation. Respiratory effort normal. Cardiovascular system: S1 & S2 +. No rubs, gallops or clicks.  Gastrointestinal system: Abdomen is nondistended, soft and tenderness to palpation. Hyperactive bowel sounds. Central nervous system: Alert and oriented. Moves all extremities Psychiatry: Judgement and insight appear normal. Flat mood and affect    Data Reviewed: I have personally reviewed following labs and imaging studies  CBC: Recent Labs  Lab 04/03/21 2230 04/05/21 0537  WBC 13.8* 6.5  NEUTROABS 12.0*  --   HGB 13.5 9.8*  HCT 40.2 29.9*  MCV 86.8 88.5  PLT 409* 252   Basic Metabolic Panel: Recent Labs  Lab 04/03/21 2230 04/04/21 1359 04/05/21 0537  NA 135  --  137  K 3.0*  --  2.9*  CL 94*  --  105  CO2 25  --  24  GLUCOSE 257*  --  126*  BUN 40*  --  39*  CREATININE 1.77*  --  1.10*  CALCIUM 8.4*  --  7.1*  MG  --  1.1*  --    GFR: Estimated Creatinine Clearance: 38.3 mL/min (A) (by C-G formula based on SCr of 1.1 mg/dL (H)). Liver Function Tests: Recent Labs  Lab 04/03/21 2230  AST 37  ALT 20  ALKPHOS 117  BILITOT 0.7  PROT 7.6  ALBUMIN 3.3*   Recent Labs  Lab 04/03/21 2230  LIPASE 30   No results for input(s): AMMONIA in the last 168 hours. Coagulation Profile: Recent Labs  Lab 04/04/21 1028  INR 1.3*   Cardiac  Enzymes: No results for input(s): CKTOTAL, CKMB, CKMBINDEX, TROPONINI in the last 168 hours. BNP (last 3 results) No results for input(s): PROBNP in the last 8760 hours. HbA1C: No results for input(s): HGBA1C in the last 72 hours. CBG: Recent Labs  Lab 04/04/21 1148 04/04/21 1648 04/04/21 2048 04/05/21 0717  GLUCAP 180* 131* 162* 112*   Lipid Profile: No results for input(s): CHOL, HDL, LDLCALC, TRIG, CHOLHDL, LDLDIRECT in the last 72 hours. Thyroid Function Tests: No results for input(s): TSH, T4TOTAL, FREET4, T3FREE, THYROIDAB in the last 72 hours. Anemia Panel: No results for input(s): VITAMINB12, FOLATE, FERRITIN, TIBC, IRON, RETICCTPCT in the last 72 hours. Sepsis Labs: Recent Labs  Lab 04/04/21 1028 04/04/21 1359 04/04/21 1831 04/04/21 2058  PROCALCITON  --  0.35  --   --   LATICACIDVEN 3.8* 2.2* 2.0* 1.8    Recent Results (from the past 240 hour(s))  Resp Panel by RT-PCR (Flu A&B, Covid) Nasopharyngeal Swab     Status: None   Collection Time: 04/03/21 10:38 PM   Specimen: Nasopharyngeal Swab; Nasopharyngeal(NP) swabs in vial transport medium  Result Value Ref Range Status   SARS Coronavirus 2 by RT PCR NEGATIVE NEGATIVE Final    Comment: (NOTE) SARS-CoV-2 target nucleic acids are NOT DETECTED.  The SARS-CoV-2 RNA is generally detectable in upper respiratory specimens during the acute phase of infection. The lowest concentration of SARS-CoV-2 viral copies this assay can detect is 138 copies/mL. A negative result does not preclude SARS-Cov-2 infection and should not be used as the sole basis for treatment or other patient management decisions. A negative result may occur with  improper specimen collection/handling, submission of specimen other than nasopharyngeal swab, presence of viral mutation(s) within the areas targeted by this assay, and inadequate number of viral copies(<138 copies/mL). A negative result must be combined with clinical observations,  patient history, and epidemiological information. The expected result is Negative.  Fact Sheet for Patients:  BloggerCourse.com  Fact Sheet for Healthcare Providers:  SeriousBroker.it  This test is no t yet approved or cleared by the [place] FDA and  has been authorized for detection and/or diagnosis of SARS-CoV-2 by FDA under an Emergency Use Authorization (EUA). This EUA will remain  in effect (meaning this test can be used) for the duration of the COVID-19 declaration under Section 564(b)(1) of the Act, 21 U.S.C.section 360bbb-3(b)(1), unless the authorization is terminated  or revoked sooner.       Influenza A by PCR NEGATIVE NEGATIVE Final   Influenza B by PCR NEGATIVE NEGATIVE Final    Comment: (NOTE) The Xpert Xpress SARS-CoV-2/FLU/RSV plus assay is intended as an aid in the diagnosis of influenza from Nasopharyngeal swab specimens and should not be used as a sole basis for treatment.  Nasal washings and aspirates are unacceptable for Xpert Xpress SARS-CoV-2/FLU/RSV testing.  Fact Sheet for Patients: BloggerCourse.com  Fact Sheet for Healthcare Providers: SeriousBroker.it  This test is not yet approved or cleared by the [place] FDA and has been authorized for detection and/or diagnosis of SARS-CoV-2 by FDA under an Emergency Use Authorization (EUA). This EUA will remain in effect (meaning this test can be used) for the duration of the COVID-19 declaration under Section 564(b)(1) of the Act, 21 U.S.C. section 360bbb-3(b)(1), unless the authorization is terminated or revoked.  Performed at [facility], [address] [place], [place] [address]   Culture, blood (x 2)     Status: None (Preliminary result)   Collection Time: 04/04/21 10:28 AM   Specimen: BLOOD  Result Value Ref Range Status   Specimen Description BLOOD RIGHT ANTECUBITAL  Final    Special Requests   Final    BOTTLES DRAWN AEROBIC AND ANAEROBIC Blood Culture results may not be optimal due to an inadequate volume of blood received in culture bottles   Culture   Final    NO GROWTH < 24 HOURS Performed at [facility], [address], [place], [place] [address]    Report Status PENDING  Incomplete  Culture, blood (Routine X 2) w Reflex to ID Panel     Status: None (Preliminary result)   Collection Time: 04/05/21  5:37 AM   Specimen: BLOOD  Result Value Ref Range Status   Specimen Description BLOOD LEFT WRIST  Final   Special Requests   Final    BOTTLES DRAWN AEROBIC AND ANAEROBIC Blood Culture adequate volume   Culture   Final    NO GROWTH <12 HOURS Performed at [facility], [address], [place], [place] [address]    Report Status PENDING  Incomplete         Radiology Studies: CT ABDOMEN PELVIS WO CONTRAST  Result Date: 04/03/2021 CLINICAL DATA:  Abdominal pain, acute, nonlocalized. Nausea, vomiting, diarrhea. EXAM: CT ABDOMEN AND PELVIS WITHOUT CONTRAST TECHNIQUE: Multidetector CT imaging of the abdomen and pelvis was performed following the standard protocol without IV contrast. COMPARISON:  02/19/2018 FINDINGS: Lower chest: 9 mm ground-glass pulmonary nodule is seen within the visualized right lower lobe, incompletely evaluated on this examination, new since prior examination. Coronary artery calcification noted. Cardiac size within normal limits. Hepatobiliary: No focal liver abnormality is seen. Status post cholecystectomy. No biliary dilatation. Pancreas: Unremarkable Spleen: Unremarkable Adrenals/Urinary Tract: The adrenal glands are unremarkable. The kidneys are normal in size and position. Vascular calcifications noted within the hila bilaterally. Parapelvic cyst noted within the right kidney. No hydronephrosis. The bladder is decompressed and is unremarkable. Stomach/Bowel: Mild sigmoid diverticulosis. Stomach, small bowel, and large  bowel are otherwise unremarkable. No evidence of obstruction or focal inflammation. Appendix absent. No free intraperitoneal gas or fluid. Vascular/Lymphatic: Status post endovascular repair of an infrarenal abdominal aortic aneurysm utilizing a bifurcated stent graft. This is not well assessed on this noncontrast examination. Aneurysm sac measures 4.1 x 4.6 cm, decreased when compared to prior examination. No pathologic adenopathy within the abdomen and pelvis. Reproductive: Status post hysterectomy. No adnexal masses. Other: Tiny fat containing umbilical hernia.  Rectum unremarkable. Musculoskeletal: Osseous structures are diffusely osteopenic. No acute bone abnormality. Degenerative changes seen within the lumbar spine. No lytic or blastic bone lesions. IMPRESSION: No acute intra-abdominal pathology. No definite radiographic explanation for the patient's reported symptoms. 9 mm ground-glass pulmonary nodule within the right lower lobe, incompletely evaluated on this examination.  Nonemergent dedicated CT imaging of the chest is recommended for further evaluation. Mild sigmoid diverticulosis. Interval endovascular stent graft repair of known infrarenal abdominal aortic aneurysm with interval decrease in size of aneurysm sac now measuring 4.6 cm in greatest dimension. This is not optimally assessed on this noncontrast examination. Aortic aneurysm NOS (ICD10-I71.9). Electronically Signed   By: Helyn Numbers M.D.   On: 04/03/2021 23:11        Scheduled Meds:  aspirin EC  81 mg Oral Daily   atorvastatin  40 mg Oral Daily   buPROPion  150 mg Oral Daily   carvedilol  25 mg Oral BID WC   enoxaparin (LOVENOX) injection  30 mg Subcutaneous Q24H   ferrous sulfate  325 mg Oral Q breakfast   fluticasone furoate-vilanterol  1 puff Inhalation Daily   gabapentin  600 mg Oral TID   gemfibrozil  600 mg Oral BID AC   insulin aspart  0-5 Units Subcutaneous QHS   insulin aspart  0-9 Units Subcutaneous TID WC    levothyroxine  112 mcg Oral QAC breakfast   multivitamin with minerals   Oral Daily   nicotine  21 mg Transdermal Daily   pantoprazole  40 mg Oral Daily   venlafaxine XR  225 mg Oral Daily   Continuous Infusions:  sodium chloride 75 mL/hr at 04/04/21 2139   piperacillin-tazobactam (ZOSYN)  IV 3.375 g (04/05/21 0650)     LOS: 0 days    Time spent: 34 mins     Charise Killian, MD [facility] Pager [phone]  If 7PM-7AM, please contact night-coverage 04/05/2021, 7:53 AM

## 2021-04-05 NOTE — Progress Notes (Signed)
OT Cancellation Note  Patient Details Name: Eileen Mack MRN: [id number] DOB: [birth year]   Cancelled Treatment:    Reason Eval/Treat Not Completed: Patient not medically ready. Order received and chart reviewed. Per chart review, pt noted to have critically low Mg at 1.0 (and downtreading) and also noted to have low K+ at 2.9. OT to hold at this time and evaluate at later time/date as pt is medically able. MD aware.   Matthew Folks, OTR/L [facility] [phone]

## 2021-04-05 NOTE — TOC Progression Note (Signed)
Transition of Care [facility]) - Progression Note    Patient Details  Name: Eileen Mack MRN: [id number] Date of Birth: 08-Jun-1935  [facility] of [facility]) CM/SW Contact  Marlowe Sax, RN Phone Number: 04/05/2021, 10:53 AM  Clinical Narrative:   Patient comes from home, Her family was visiting and has been sick recently, She will have PT eval and assessment once Mg is WnL.  TOC to follow for needs and DC  planning         Expected Discharge Plan and Services                                                 Social Determinants of Health (SDOH) Interventions    Readmission Risk Interventions No flowsheet data found.

## 2021-04-05 NOTE — Progress Notes (Signed)
PT Cancellation Note  Patient Details Name: Eileen Mack: [id number] DOB: [birth year]   Cancelled Treatment:    Reason Eval/Treat Not Completed: Patient not medically ready. Order received and chart reviewed. Per chart review, pt noted to have critically low Mg at 1.0 (and downtreading) and also noted to have low K+ at 2.9. OT to hold at this time and evaluate at later time/date as pt is medically able. MD aware.    12:56 PM, 04/05/21 Rosamaria Lints, PT, DPT Physical Therapist - [facility]  [phone] [facility])

## 2021-04-06 DIAGNOSIS — A084 Viral intestinal infection, unspecified: Secondary | ICD-10-CM | POA: Diagnosis not present

## 2021-04-06 LAB — BASIC METABOLIC PANEL
Anion gap: 7 (ref 5–15)
BUN: 29 mg/dL — ABNORMAL HIGH (ref 8–23)
CO2: 25 mmol/L (ref 22–32)
Calcium: 7.2 mg/dL — ABNORMAL LOW (ref 8.9–10.3)
Chloride: 104 mmol/L (ref 98–111)
Creatinine, Ser: 0.95 mg/dL (ref 0.44–1.00)
GFR, Estimated: 60 mL/min — ABNORMAL LOW (ref >60)
Glucose, Bld: 85 mg/dL (ref 70–99)
Potassium: 3.8 mmol/L (ref 3.5–5.1)
Sodium: 136 mmol/L (ref 135–145)

## 2021-04-06 LAB — CBC
HCT: 28.3 % — ABNORMAL LOW (ref 36.0–46.0)
Hemoglobin: 9.1 g/dL — ABNORMAL LOW (ref 12.0–15.0)
MCH: 28.4 pg (ref 26.0–34.0)
MCHC: 32.2 g/dL (ref 30.0–36.0)
MCV: 88.4 fL (ref 80.0–100.0)
Platelets: 218 10*3/uL (ref 150–400)
RBC: 3.2 MIL/uL — ABNORMAL LOW (ref 3.87–5.11)
RDW: 14.2 % (ref 11.5–15.5)
WBC: 5.1 10*3/uL (ref 4.0–10.5)
nRBC: 0 % (ref 0.0–0.2)

## 2021-04-06 LAB — GLUCOSE, CAPILLARY
Glucose-Capillary: 127 mg/dL — ABNORMAL HIGH (ref 70–99)
Glucose-Capillary: 163 mg/dL — ABNORMAL HIGH (ref 70–99)
Glucose-Capillary: 110 mg/dL — ABNORMAL HIGH (ref 70–99)
Glucose-Capillary: 126 mg/dL — ABNORMAL HIGH (ref 70–99)

## 2021-04-06 LAB — MAGNESIUM: Magnesium: 1.8 mg/dL (ref 1.7–2.4)

## 2021-04-06 MED ORDER — LORAZEPAM 2 MG/ML IJ SOLN
0.2500 mg | Freq: Once | INTRAMUSCULAR | Status: AC
  Administered 2021-04-06: 0.25 mg via INTRAVENOUS
  Filled 2021-04-06: qty 1

## 2021-04-06 MED ORDER — CHOLESTYRAMINE 4 G PO PACK
4.0000 g | PACK | Freq: Two times a day (BID) | ORAL | Status: DC
  Administered 2021-04-06 – 2021-04-07 (×3): 4 g via ORAL
  Filled 2021-04-06 (×4): qty 1

## 2021-04-06 MED ORDER — AMOXICILLIN-POT CLAVULANATE 875-125 MG PO TABS
1.0000 | ORAL_TABLET | Freq: Two times a day (BID) | ORAL | Status: DC
  Administered 2021-04-06 – 2021-04-07 (×2): 1 via ORAL
  Filled 2021-04-06 (×3): qty 1

## 2021-04-06 NOTE — Consult Note (Addendum)
Wyline Mood , MD [address], [address], [place], [place], [address] [address], [address], [place], [place], [address] Phone: [phone]  Fax: [phone]  Consultation  Referring Provider:    Dr Mayford Knife Primary Care Physician:  Erasmo Downer, MD Primary Gastroenterologist: [facility]          Reason for Consultation:     Diarrhea   Date of Admission:  04/03/2021 Date of Consultation:  04/06/2021         HPI:   MITRA DULING is a 85 y.o. female with a history of COPD, GERD, atrial fibrillation presented to the emergency room on 04/04/2021 with nausea vomiting abdominal pain and diarrhea.  At had been ongoing for at least 4 days.  Found to be in A. fib with RVR heart rate of 135 history of iron deficiency anemia in the past.  She states that the diarrhea has been longstanding for many years.  She has had her gallbladder taken out many years.  Cannot recollect with the diarrhea began after that.  Has 3-4 bowel movements a day.  She states that on this episode it was accompanied with nausea vomiting and abdominal pain which has resolved after coming into the hospital.  Denies any NSAID use.  Denies any use of artificial sugars or sweeteners.  Denies any use of metformin.  On admission being treated for acute gastroenteritis service.  Labs On admission hemoglobin 9.8 g with an MCV of 88.5, BMP showed a potassium of 3.8 creatinine of 0.95, GI PCR negative for infection blood cultures showed no growth lipase was 30.  CT abdomen and pelvis on 04/03/2021 showed no acute intra-abdominal pathology.  Mild colonic diverticulosis.  Patient had a colonoscopy back in 2017 by Dr. Mechele Collin for iron deficiency anemia and was found to have internal hemorrhoids and 2 sessile polyps were resected in the rectum.  Exam was otherwise normal.  Past Medical History:  Diagnosis Date   AAA (abdominal aortic aneurysm)    Allergy    Anemia    Atrial fibrillation (HCC)    Blood transfusion without  reported diagnosis    CAD (coronary artery disease)    s/p PCI to LAD & RCA   COPD (chronic obstructive pulmonary disease) (HCC)    Depression    Emphysema of lung (HCC)    GERD (gastroesophageal reflux disease)    History of blood clots    Hyperlipidemia    Hypertension    Personal history of tobacco use, presenting hazards to health 07/01/2015   Renal disorder    Thyroid disease     Past Surgical History:  Procedure Laterality Date   ABDOMINAL HYSTERECTOMY     APPENDECTOMY     CARDIAC SURGERY     CHOLECYSTECTOMY     COLONOSCOPY WITH PROPOFOL Bilateral 08/01/2015   Procedure: COLONOSCOPY WITH PROPOFOL;  Surgeon: Scot Jun, MD;  Location: [facility] ENDOSCOPY;  Service: Endoscopy;  Laterality: Bilateral;   ENDOVASCULAR REPAIR/STENT GRAFT N/A 02/21/2018   Procedure: ENDOVASCULAR REPAIR/STENT GRAFT;  Surgeon: Renford Dills, MD;  Location: [facility] INVASIVE CV LAB;  Service: Cardiovascular;  Laterality: N/A;   ESOPHAGOGASTRODUODENOSCOPY N/A 07/31/2015   Procedure: ESOPHAGOGASTRODUODENOSCOPY (EGD);  Surgeon: Scot Jun, MD;  Location: [facility] ENDOSCOPY;  Service: Endoscopy;  Laterality: N/A;   EYE SURGERY     LEFT HEART CATH AND CORONARY ANGIOGRAPHY N/A 04/03/2020   Procedure: LEFT HEART CATH AND CORONARY ANGIOGRAPHY possible PCI and stent;  Surgeon: Alwyn Pea, MD;  Location: [facility]  INVASIVE CV LAB;  Service: Cardiovascular;  Laterality: N/A;    Prior to Admission medications   Medication Sig Start Date End Date Taking? Authorizing Provider  acetaminophen (TYLENOL) 650 MG CR tablet Take 1,300 mg by mouth every 8 (eight) hours.   Yes [provider]  albuterol (VENTOLIN HFA) 108 (90 Base) MCG/ACT inhaler Inhale 2 puffs into the lungs every 6 (six) hours as needed for wheezing or shortness of breath. 05/04/20  Yes Bacigalupo, Marzella Schlein, MD  amLODipine (NORVASC) 5 MG tablet Take 1 tablet (5 mg total) by mouth daily. 04/01/21  Yes Jacky Kindle, FNP  aspirin EC 81 MG tablet  Take 81 mg by mouth daily.   Yes [provider]  atorvastatin (LIPITOR) 40 MG tablet Take 1 tablet (40 mg total) by mouth daily. 04/01/21  Yes Merita Norton T, FNP  buPROPion (WELLBUTRIN XL) 150 MG 24 hr tablet Take 1 tablet (150 mg total) by mouth daily. 04/01/21  Yes Jacky Kindle, FNP  carvedilol (COREG) 25 MG tablet Take 1 tablet (25 mg total) by mouth 2 (two) times daily with a meal. 04/01/21  Yes Jacky Kindle, FNP  cloNIDine (CATAPRES) 0.2 MG tablet Take 1 tablet (0.2 mg total) by mouth 2 (two) times daily. 02/16/21  Yes Bacigalupo, Marzella Schlein, MD  famotidine (PEPCID) 20 MG tablet Take 1 tablet (20 mg total) by mouth 2 (two) times daily. 04/04/21  Yes Sharman Cheek, MD  ferrous sulfate 325 (65 FE) MG tablet Take 1 tablet (325 mg total) by mouth daily with breakfast. 08/01/15  Yes Mody, Sital, MD  fluticasone (FLONASE) 50 MCG/ACT nasal spray Place into the nose. 08/15/19  Yes [provider]  fluticasone furoate-vilanterol (BREO ELLIPTA) 200-25 MCG/ACT AEPB INHALE 1 PUFF IN THE MORNING 03/19/21  Yes Bacigalupo, Marzella Schlein, MD  gabapentin (NEURONTIN) 600 MG tablet Take 1 tablet (600 mg total) by mouth 3 (three) times daily. 04/01/21  Yes Jacky Kindle, FNP  gemfibrozil (LOPID) 600 MG tablet Take 1 tablet (600 mg total) by mouth 2 (two) times daily before a meal. 04/01/21  Yes Jacky Kindle, FNP  isosorbide mononitrate (IMDUR) 30 MG 24 hr tablet Take 1 tablet (30 mg total) by mouth daily. 04/01/21  Yes Jacky Kindle, FNP  levothyroxine (EUTHYROX) 112 MCG tablet Take 1 tablet (112 mcg total) by mouth daily. OK for change in manufacturer 04/01/21  Yes Merita Norton T, FNP  lidocaine (LIDODERM) 5 % Place 1 patch onto the skin every 12 (twelve) hours. Remove & Discard patch within 12 hours or as directed by MD 02/09/21 02/09/22 Yes Delton Prairie, MD  loperamide (IMODIUM A-D) 2 MG tablet Take 2 tablets (4 mg total) by mouth 4 (four) times daily as needed for diarrhea or loose stools. 04/04/21   Yes Sharman Cheek, MD  Multiple Vitamins-Minerals (MULTIVITAMIN GUMMIES ADULT PO) Take 1 each by mouth daily.   Yes [provider]  omeprazole (PRILOSEC) 20 MG capsule Take 1 capsule (20 mg total) by mouth daily. 04/01/21  Yes Jacky Kindle, FNP  ondansetron (ZOFRAN-ODT) 4 MG disintegrating tablet Take 1 tablet (4 mg total) by mouth every 8 (eight) hours as needed for nausea or vomiting. 04/04/21  Yes Sharman Cheek, MD  Venlafaxine HCl 225 MG TB24 Take 1 tablet (225 mg total) by mouth daily. 04/01/21  Yes Jacky Kindle, FNP  nitroGLYCERIN (NITROSTAT) 0.4 MG SL tablet Place 1 tablet (0.4 mg total) under the tongue every 5 (five) minutes as needed for chest  pain. 09/24/20   Erasmo Downer, MD    Family History  Problem Relation Age of Onset   Heart attack Father 51   Brain cancer Sister    Lung cancer Sister    Breast cancer Neg Hx    Colon cancer Neg Hx      Social History   Tobacco Use   Smoking status: Every Day    Packs/day: 1.00    Years: 62.00    Pack years: 62.00    Types: Cigarettes   Smokeless tobacco: Never  Vaping Use   Vaping Use: Never used  Substance Use Topics   Alcohol use: Not Currently   Drug use: Never    Allergies as of 04/03/2021 - Review Complete 04/03/2021  Allergen Reaction Noted   Iodinated diagnostic agents Hives, Itching, and Rash 07/03/2015   Sulfa antibiotics Rash 07/03/2015    Review of Systems:    All systems reviewed and negative except where noted in HPI.   Physical Exam:  Vital signs in last 24 hours: Temp:  [97.7 F (36.5 C)-98.2 F (36.8 C)] 98.1 F (36.7 C) (12/06 1135) Pulse Rate:  [59-66] 61 (12/06 1135) Resp:  [15-20] 15 (12/06 1135) BP: (98-142)/(53-83) 132/61 (12/06 1135) SpO2:  [95 %-100 %] 100 % (12/06 1135) Last BM Date: 04/05/21 General:   Pleasant, cooperative in NAD Head:  Normocephalic and atraumatic. Eyes:   No icterus.   Conjunctiva pink. PERRLA. Ears:  Normal auditory acuity. Neck:   Supple; no masses or thyroidomegaly Lungs: Respirations even and unlabored. Lungs clear to auscultation bilaterally.   No wheezes, crackles, or rhonchi.  Heart:  Regular rate and rhythm;  Without murmur, clicks, rubs or gallops Abdomen:  Soft, nondistended, nontender. Normal bowel sounds. No appreciable masses or hepatomegaly.  No rebound or guarding.  Neurologic:  Alert and oriented x3;  grossly normal neurologically. Skin:  Intact without significant lesions or rashes. Cervical Nodes:  No significant cervical adenopathy. Psych:  Alert and cooperative. Normal affect.  LAB RESULTS: Recent Labs    04/03/21 2230 04/05/21 0537 04/06/21 0346  WBC 13.8* 6.5 5.1  HGB 13.5 9.8* 9.1*  HCT 40.2 29.9* 28.3*  PLT 409* 252 218   BMET Recent Labs    04/03/21 2230 04/05/21 0537 04/06/21 0346  NA 135 137 136  K 3.0* 2.9* 3.8  CL 94* 105 104  CO2 25 24 25   GLUCOSE 257* 126* 85  BUN 40* 39* 29*  CREATININE 1.77* 1.10* 0.95  CALCIUM 8.4* 7.1* 7.2*   LFT Recent Labs    04/03/21 2230  PROT 7.6  ALBUMIN 3.3*  AST 37  ALT 20  ALKPHOS 117  BILITOT 0.7   PT/INR Recent Labs    04/04/21 1028  LABPROT 15.7*  INR 1.3*    STUDIES: No results found.    Impression / Plan:   JOSSETTE ZIRBEL is a 84 y.o. y/o female with a history of chronic diarrhea for many years which she says has not changed.  Admission complicated by additional nausea vomiting and abdominal pain which has since resolved suggesting an acute gastroenteritis.  Stool studies have been negative.  She has had her gallbladder taken out many years back.  I explained to her that it is possible that she may have bile salt mediated diarrhea.  Suggested a trial of Questran and if no better should consider colonoscopy.  She says she is not very keen on a colonoscopy to begin with but she is willing to consider it if the  Questran does not work.  Thank you for involving me in the care of this patient.      LOS: 1 day    Wyline Mood, MD  04/06/2021, 11:49 AM

## 2021-04-06 NOTE — Progress Notes (Signed)
PROGRESS NOTE    HPI was taken from Dr. Clyde Lundborg: Eileen Mack is a 85 y.o. female with medical history significant of hypertension, hyperlipidemia, prediabetes, COPD, GERD, hypothyroidism, depression, CKD-3A, tobacco abuse, CAD, atrial fibrillation on anticoagulants (s/p ablation), AAA, varicose vein of legs, iron deficiency anemia, left bundle blockage, who presents with nausea vomiting, diarrhea and abdominal pain.   Patient states that her symptoms has been going on for more than 4 days, including nausea vomiting, diarrhea and abdominal pain.  She states that she has multiple episodes of watery diarrhea each day.  She also has multiple nonbilious nonbloody vomiting each day.  Her abdominal pain is diffuse, cramping-like pain, nonradiating, mild to moderate, currently abdominal pain is mild.  No fever or chills.  Patient has mild dry cough, mild shortness breath which she attributes to COPD.  No chest pain.  No symptoms of UTI.   Patient was initially found to have A. fib with RVR with heart rate up to 135, which has converted in ED.  Currently heart rate is at 90s.   ED Course: pt was found to have WBC 13.8, negative COVID PCR, AKI with creatinine 1.77, BUN 40 (baseline creatinine 0.97 on 02/09/2021), lipase 30, temperature normal, blood pressure 165/85, heart rate 135, 99, RR 22, oxygen saturation 93-97% on room air.  Patient is placed on MedSurg bed for observation.     Hospital course from Dr. Mayford Knife 12/5-12/6/22: Pt present w/ gastroenteritis and was placed on IV zosyn initially and this was changed to augmentin. GI PCR panel is neg. Pt has hx of chronic diarrhea for several years as per pt. GI was consulted and recommend start questran and if no improvement to consider colonoscopy. PT evaluated pt and recs HH. HH orders placed     Eileen Mack  VEL:[id number] DOB: 1935-12-27 DOA: 04/03/2021 PCP: Erasmo Downer, MD    Assessment & Plan:   Principal Problem:    Acute gastroenteritis Active Problems:   Iron deficiency anemia due to chronic blood loss   Atrial fibrillation with RVR (HCC)   Hypothyroidism   GERD (gastroesophageal reflux disease)   Tobacco abuse   Prediabetes   Hypertension   Hyperlipidemia   COPD (chronic obstructive pulmonary disease) (HCC)   Depression   Acute renal failure superimposed on stage 3a chronic kidney disease (HCC)   SIRS (systemic inflammatory response syndrome) (HCC)   Hypokalemia   Gastroenteritis  Acute gastroenteritis: etiology unclear. Changed IV zosyn to po augmentin.  GI PCR panel is neg. Blood cxs NGTD    SIRS: meets criteria for SIRS with leukocytosis, tachycardia, tachypnea, lactic acid is elevated.  If specific infection source identified, will change to sepsis. Continue on IVFs   Hypomagnesemia: WNL today   Hypokalemia: WNL today    IDA due to chronic blood loss: continue on iron supplement    Likely PAF w/ RVR: continue on coreg. Not on anticoagulation     Hypothyroidism: continue on home dose of levothyroxine   GERD: continue on PPI    Tobacco abuse: nicotine patch to prevent w/drawl. Smoking cessation counseling   PreDM: HbA1c 6.4. Continue on SSI w/ accuchecks   HTN: continue on coreg. Continue to hold amlodipine, clonidine    HLD: continue on statin, gemfibrozil    COPD: w/o exacerbation. Continue on bronchodilators    Depression: severity unknown. Continue on home dose of bupropion   AKI on CKDIIIa: likely due to dehydration. Cr is trending from day prior    DVT prophylaxis: lovenox  Code Status: DNR Family Communication:  Disposition Plan: likely d/c home w/ HH   Level of care: Telemetry Medical  Status is: Inpatient  Remains inpatient appropriate because: can likely be d/c home tomorrow,       Consultants:  GI   Procedures:   Antimicrobials: augmentin    Subjective: Pt c/o diarrhea   Objective: Vitals:   04/05/21 1540 04/05/21 2106 04/06/21 0545  04/06/21 0752  BP: 98/83 (!) 114/53 (!) 142/56 (!) 128/53  Pulse: 64 (!) 59 65 66  Resp: 18 20 20 15   Temp: 98.2 F (36.8 C) 98 F (36.7 C) 97.7 F (36.5 C) 97.8 F (36.6 C)  TempSrc:    Oral  SpO2: 100% 100% 100% 95%  Weight:      Height:        Intake/Output Summary (Last 24 hours) at 04/06/2021 0803 Last data filed at 04/05/2021 1511 Gross per 24 hour  Intake 458.94 ml  Output --  Net 458.94 ml   Filed Weights   04/03/21 2219 04/04/21 1050  Weight: 68 kg 68 kg    Examination:  General exam: Appears calm but uncomfortable  Respiratory system: clear breath sounds b/l  Cardiovascular system: S1/S2+. No rubs or clicks  Gastrointestinal system: Abd is soft, NT, ND & hyperactive bowel sounds  Central nervous system: alert and oriented. Moves all extremities Psychiatry: Judgement and insight appears normal. Flat mood and affect    Data Reviewed: I have personally reviewed following labs and imaging studies  CBC: Recent Labs  Lab 04/03/21 2230 04/05/21 0537 04/06/21 0346  WBC 13.8* 6.5 5.1  NEUTROABS 12.0*  --   --   HGB 13.5 9.8* 9.1*  HCT 40.2 29.9* 28.3*  MCV 86.8 88.5 88.4  PLT 409* 252 218   Basic Metabolic Panel: Recent Labs  Lab 04/03/21 2230 04/04/21 1359 04/05/21 0537 04/06/21 0346  NA 135  --  137 136  K 3.0*  --  2.9* 3.8  CL 94*  --  105 104  CO2 25  --  24 25  GLUCOSE 257*  --  126* 85  BUN 40*  --  39* 29*  CREATININE 1.77*  --  1.10* 0.95  CALCIUM 8.4*  --  7.1* 7.2*  MG  --  1.1* 1.0*  --    GFR: Estimated Creatinine Clearance: 44.4 mL/min (by C-G formula based on SCr of 0.95 mg/dL). Liver Function Tests: Recent Labs  Lab 04/03/21 2230  AST 37  ALT 20  ALKPHOS 117  BILITOT 0.7  PROT 7.6  ALBUMIN 3.3*   Recent Labs  Lab 04/03/21 2230  LIPASE 30   No results for input(s): AMMONIA in the last 168 hours. Coagulation Profile: Recent Labs  Lab 04/04/21 1028  INR 1.3*   Cardiac Enzymes: No results for input(s): CKTOTAL,  CKMB, CKMBINDEX, TROPONINI in the last 168 hours. BNP (last 3 results) No results for input(s): PROBNP in the last 8760 hours. HbA1C: No results for input(s): HGBA1C in the last 72 hours. CBG: Recent Labs  Lab 04/05/21 1119 04/05/21 1516 04/05/21 1751 04/05/21 2107 04/06/21 0750  GLUCAP 229* 120* 88 192* 127*   Lipid Profile: No results for input(s): CHOL, HDL, LDLCALC, TRIG, CHOLHDL, LDLDIRECT in the last 72 hours. Thyroid Function Tests: No results for input(s): TSH, T4TOTAL, FREET4, T3FREE, THYROIDAB in the last 72 hours. Anemia Panel: No results for input(s): VITAMINB12, FOLATE, FERRITIN, TIBC, IRON, RETICCTPCT in the last 72 hours. Sepsis Labs: Recent Labs  Lab 04/04/21 1028 04/04/21 1359  04/04/21 1831 04/04/21 2058  PROCALCITON  --  0.35  --   --   LATICACIDVEN 3.8* 2.2* 2.0* 1.8    Recent Results (from the past 240 hour(s))  Resp Panel by RT-PCR (Flu A&B, Covid) Nasopharyngeal Swab     Status: None   Collection Time: 04/03/21 10:38 PM   Specimen: Nasopharyngeal Swab; Nasopharyngeal(NP) swabs in vial transport medium  Result Value Ref Range Status   SARS Coronavirus 2 by RT PCR NEGATIVE NEGATIVE Final    Comment: (NOTE) SARS-CoV-2 target nucleic acids are NOT DETECTED.  The SARS-CoV-2 RNA is generally detectable in upper respiratory specimens during the acute phase of infection. The lowest concentration of SARS-CoV-2 viral copies this assay can detect is 138 copies/mL. A negative result does not preclude SARS-Cov-2 infection and should not be used as the sole basis for treatment or other patient management decisions. A negative result may occur with  improper specimen collection/handling, submission of specimen other than nasopharyngeal swab, presence of viral mutation(s) within the areas targeted by this assay, and inadequate number of viral copies(<138 copies/mL). A negative result must be combined with clinical observations, patient history, and  epidemiological information. The expected result is Negative.  Fact Sheet for Patients:  BloggerCourse.com  Fact Sheet for Healthcare Providers:  SeriousBroker.it  This test is no t yet approved or cleared by the [place] FDA and  has been authorized for detection and/or diagnosis of SARS-CoV-2 by FDA under an Emergency Use Authorization (EUA). This EUA will remain  in effect (meaning this test can be used) for the duration of the COVID-19 declaration under Section 564(b)(1) of the Act, 21 U.S.C.section 360bbb-3(b)(1), unless the authorization is terminated  or revoked sooner.       Influenza A by PCR NEGATIVE NEGATIVE Final   Influenza B by PCR NEGATIVE NEGATIVE Final    Comment: (NOTE) The Xpert Xpress SARS-CoV-2/FLU/RSV plus assay is intended as an aid in the diagnosis of influenza from Nasopharyngeal swab specimens and should not be used as a sole basis for treatment. Nasal washings and aspirates are unacceptable for Xpert Xpress SARS-CoV-2/FLU/RSV testing.  Fact Sheet for Patients: BloggerCourse.com  Fact Sheet for Healthcare Providers: SeriousBroker.it  This test is not yet approved or cleared by the [place] FDA and has been authorized for detection and/or diagnosis of SARS-CoV-2 by FDA under an Emergency Use Authorization (EUA). This EUA will remain in effect (meaning this test can be used) for the duration of the COVID-19 declaration under Section 564(b)(1) of the Act, 21 U.S.C. section 360bbb-3(b)(1), unless the authorization is terminated or revoked.  Performed at [facility], [address] [place], [place] [address]   Culture, blood (x 2)     Status: None (Preliminary result)   Collection Time: 04/04/21 10:28 AM   Specimen: BLOOD  Result Value Ref Range Status   Specimen Description BLOOD RIGHT ANTECUBITAL  Final   Special Requests    Final    BOTTLES DRAWN AEROBIC AND ANAEROBIC Blood Culture results may not be optimal due to an inadequate volume of blood received in culture bottles   Culture   Final    NO GROWTH < 24 HOURS Performed at [facility], [address] [place], [place] [address]    Report Status PENDING  Incomplete  Culture, blood (Routine X 2) w Reflex to ID Panel     Status: None (Preliminary result)   Collection Time: 04/05/21  5:37 AM   Specimen: BLOOD  Result Value Ref Range Status  Specimen Description BLOOD LEFT WRIST  Final   Special Requests   Final    BOTTLES DRAWN AEROBIC AND ANAEROBIC Blood Culture adequate volume   Culture   Final    NO GROWTH <12 HOURS Performed at [facility], [address] [place], [place] [address]    Report Status PENDING  Incomplete  Gastrointestinal Panel by PCR , Stool     Status: None   Collection Time: 04/05/21  1:12 PM   Specimen: Stool  Result Value Ref Range Status   Campylobacter species NOT DETECTED NOT DETECTED Final   Plesimonas shigelloides NOT DETECTED NOT DETECTED Final   Salmonella species NOT DETECTED NOT DETECTED Final   Yersinia enterocolitica NOT DETECTED NOT DETECTED Final   Vibrio species NOT DETECTED NOT DETECTED Final   Vibrio cholerae NOT DETECTED NOT DETECTED Final   Enteroaggregative E coli (EAEC) NOT DETECTED NOT DETECTED Final   Enteropathogenic E coli (EPEC) NOT DETECTED NOT DETECTED Final   Enterotoxigenic E coli (ETEC) NOT DETECTED NOT DETECTED Final   Shiga like toxin producing E coli (STEC) NOT DETECTED NOT DETECTED Final   Shigella/Enteroinvasive E coli (EIEC) NOT DETECTED NOT DETECTED Final   Cryptosporidium NOT DETECTED NOT DETECTED Final   Cyclospora cayetanensis NOT DETECTED NOT DETECTED Final   Entamoeba histolytica NOT DETECTED NOT DETECTED Final   Giardia lamblia NOT DETECTED NOT DETECTED Final   Adenovirus F40/41 NOT DETECTED NOT DETECTED Final   Astrovirus NOT DETECTED NOT DETECTED Final    Norovirus GI/GII NOT DETECTED NOT DETECTED Final   Rotavirus A NOT DETECTED NOT DETECTED Final   Sapovirus (I, II, IV, and V) NOT DETECTED NOT DETECTED Final    Comment: Performed at [facility], [address], [place], [place] [address]         Radiology Studies: No results found.      Scheduled Meds:  aspirin EC  81 mg Oral Daily   atorvastatin  40 mg Oral Daily   buPROPion  150 mg Oral Daily   carvedilol  25 mg Oral BID WC   enoxaparin (LOVENOX) injection  40 mg Subcutaneous Q24H   ferrous sulfate  325 mg Oral Q breakfast   fluticasone furoate-vilanterol  1 puff Inhalation Daily   gabapentin  600 mg Oral TID   gemfibrozil  600 mg Oral BID AC   insulin aspart  0-5 Units Subcutaneous QHS   insulin aspart  0-9 Units Subcutaneous TID WC   levothyroxine  112 mcg Oral QAC breakfast   multivitamin with minerals   Oral Daily   nicotine  21 mg Transdermal Daily   pantoprazole  40 mg Oral Daily   venlafaxine XR  225 mg Oral Daily   Continuous Infusions:  sodium chloride 75 mL/hr at 04/04/21 2139   piperacillin-tazobactam (ZOSYN)  IV 3.375 g (04/06/21 0622)     LOS: 1 day    Time spent: 25  mins     Charise Killian, MD [facility] Pager [phone]  If 7PM-7AM, please contact night-coverage 04/06/2021, 8:03 AM

## 2021-04-06 NOTE — Evaluation (Signed)
Physical Therapy Evaluation Patient Details Name: Eileen Mack MRN: [id number] DOB: May 05, 1935 Today's Date: 04/06/2021  History of Present Illness  Eileen Mack is an 84yoF who comes [facility] c N/V/D, ABD pain. PMH: HTN, HLD, COPD, GERD, hypoTSH, depression, CKD3a, tobacco abuse, CAD, AF on a/c, AAA, anemia, LBBB. Patient was initially found to have AF with RVR with heart rate up to 135, which has converted in ED. Pt admitted with acute gastroenteritis and SIRS.  Clinical Impression  Pt admitted with above diagnosis. Pt currently with functional limitations due to the deficits listed below (see "PT Problem List"). Upon entry, pt seated edge of bed, awake and agreeable to participate. The pt is alert, pleasant, interactive, and able to provide info regarding prior level of function, both in tolerance and independence. Pt has been AMB in room ad lib this morning. Pt agreeable to AMB in hallway today, attempted yesterday, but told to go back to room since she didn't have a mask. Pt AMB around unit twice, not at baseline, but able without device, mild sway increase but without frank LOB and no UE needed for recovery of sway. Discussed HHPT v OPPT at DC, pt admits to historical difficulty with prolonged distances due to legs giving out or becoming severely fatigued- could be better assessed as an outpatient. Patient's performance this date reveals decreased ability, independence, and tolerance in performing all basic mobility required for performance of activities of daily living. Pt requires additional DME, close physical assistance, and cues for safe participate in mobility. Pt will benefit from skilled PT intervention to increase independence and safety with basic mobility in preparation for discharge to the venue listed below.          Recommendations for follow up therapy are one component of a multi-disciplinary discharge planning process, led by the attending physician.   Recommendations may be updated based on patient status, additional functional criteria and insurance authorization.  Follow Up Recommendations Home health PT    Assistance Recommended at Discharge PRN  Functional Status Assessment Patient has had a recent decline in their functional status and demonstrates the ability to make significant improvements in function in a reasonable and predictable amount of time.  Equipment Recommendations  None recommended by PT    Recommendations for Other Services       Precautions / Restrictions Precautions Precautions: None Restrictions Weight Bearing Restrictions: No      Mobility  Bed Mobility               General bed mobility comments: sitting EOB at entry    Transfers Overall transfer level: Needs assistance Equipment used: None Transfers: Sit to/from Stand Sit to Stand: Supervision           General transfer comment: appears somewhat unsteady    Ambulation/Gait   Gait Distance (Feet): 350 Feet Assistive device: None Gait Pattern/deviations: Step-to pattern;Drifts right/left Gait velocity: 0.54m/s        Stairs            Wheelchair Mobility    Modified Rankin (Stroke Patients Only)       Balance Overall balance assessment: Modified Independent                                           Pertinent Vitals/Pain Pain Assessment: Faces Faces Pain Scale: Hurts little more Pain Location: ABD pain Pain Descriptors /  Indicators: Aching;Cramping    Home Living Family/patient expects to be discharged to:: Private residence Living Arrangements: Alone Available Help at Discharge: Family (Pt's grandDTR is looking after her Shitshu right now, but pt doesn't acknowledge that she might need help with IADL at DC.) Type of Home: Apartment Home Access: Level entry       Home Layout: One level Home Equipment: None      Prior Function Prior Level of Function : Independent/Modified  Independent             Mobility Comments: no difficulty at baseline       Hand Dominance        Extremity/Trunk Assessment   Upper Extremity Assessment Upper Extremity Assessment: Overall WFL for tasks assessed    Lower Extremity Assessment Lower Extremity Assessment: Overall WFL for tasks assessed       Communication      Cognition Arousal/Alertness: Awake/alert Behavior During Therapy: WFL for tasks assessed/performed Overall Cognitive Status: Within Functional Limits for tasks assessed                                          General Comments      Exercises     Assessment/Plan    PT Assessment Patient needs continued PT services  PT Problem List Decreased activity tolerance;Decreased balance;Decreased mobility       PT Treatment Interventions Balance training;Neuromuscular re-education;Functional mobility training;Therapeutic activities;Therapeutic exercise;Patient/family education    PT Goals (Current goals can be found in the Care Plan section)  Acute Rehab PT Goals Patient Stated Goal: produce solid bowel; return to home to see dog PT Goal Formulation: With patient Time For Goal Achievement: 04/20/21 Potential to Achieve Goals: Good    Frequency Min 2X/week   Barriers to discharge        Co-evaluation               AM-PAC PT "6 Clicks" Mobility  Outcome Measure Help needed turning from your back to your side while in a flat bed without using bedrails?: None Help needed moving from lying on your back to sitting on the side of a flat bed without using bedrails?: None Help needed moving to and from a bed to a chair (including a wheelchair)?: None Help needed standing up from a chair using your arms (e.g., wheelchair or bedside chair)?: None Help needed to walk in hospital room?: A Little Help needed climbing 3-5 steps with a railing? : A Little 6 Click Score: 22    End of Session   Activity Tolerance: Patient  tolerated treatment well;No increased pain Patient left: with nursing/sitter in room Nurse Communication: Mobility status PT Visit Diagnosis: Unsteadiness on feet (R26.81);Muscle weakness (generalized) (M62.81)    Time: 3419-3790 PT Time Calculation (min) (ACUTE ONLY): 14 min   Charges:   PT Evaluation $PT Eval Low Complexity: 1 Low PT Treatments $Therapeutic Activity: 8-22 mins       1:03 PM, 04/06/21 Rosamaria Lints, PT, DPT Physical Therapist - [facility]  [phone] (ASCOM)    Jabe Jeanbaptiste C 04/06/2021, 1:01 PM

## 2021-04-06 NOTE — Evaluation (Signed)
Occupational Therapy Evaluation Patient Details Name: Eileen Mack MRN: [id number] DOB: 09-Jan-1936 Today's Date: 04/06/2021   History of Present Illness Babbette Ackerley is an 84yoF who comes to [facility] on 12/3 c N/V/D, ABD pain. PMH: HTN, HLD, COPD, GERD, hypoTSH, depression, CKD3a, tobacco abuse, CAD, AF on a/c, AAA, anemia, LBBB. Patient was initially found to have AF with RVR with heart rate up to 135, which has converted in ED. Pt admitted with acute gastroenteritis and SIRS.   Clinical Impression   Pt seen for OT evaluation this date. Prior to admission, pt was independent in all ADLs/IADLs and functional mobility, living in a 1-story apartment alone. Pt currently endorses decreased activity tolerance and was observed to have some unsteadiness during functional transfers/mobility, requiring SUPERVISION only as pt was able to regain balance without physical assist. Pt completed bed mobility and x1 standing grooming task MOD-I. Pt would benefit from additional skilled OT services to maximize return to PLOF and minimize risk of future falls, injury, caregiver burden, and readmission. Upon discharge, recommend no OT follow up.       Recommendations for follow up therapy are one component of a multi-disciplinary discharge planning process, led by the attending physician.  Recommendations may be updated based on patient status, additional functional criteria and insurance authorization.   Follow Up Recommendations  No OT follow up    Assistance Recommended at Discharge PRN  Functional Status Assessment  Patient has had a recent decline in their functional status and demonstrates the ability to make significant improvements in function in a reasonable and predictable amount of time.  Equipment Recommendations  None recommended by OT       Precautions / Restrictions Precautions Precautions: None Restrictions Weight Bearing Restrictions: No      Mobility Bed Mobility Overal bed  mobility: Independent             General bed mobility comments: Able to perform with ease and HOB flat    Transfers Overall transfer level: Needs assistance Equipment used: None Transfers: Sit to/from Stand Sit to Stand: Supervision           General transfer comment: x2 bouts, demonstrating some unsteadiness when upright however able to self correct      Balance Overall balance assessment: Needs assistance Sitting-balance support: No upper extremity supported;Feet supported Sitting balance-Leahy Scale: Normal     Standing balance support: No upper extremity supported;During functional activity Standing balance-Leahy Scale: Good Standing balance comment: Some unsteadiness noted following sit>stand transfer and x1 lateral LOB observed during functional mobility, however pt able to self-corret                           ADL either performed or assessed with clinical judgement   ADL Overall ADL's : Needs assistance/impaired     Grooming: Wash/dry hands;Modified independent;Standing                   Toilet Transfer: Supervision/safety;Regular Toilet           Functional mobility during ADLs: Supervision/safety (to walk x2 laps around RN station)       Vision Baseline Vision/History: 6 Macular Degeneration Ability to See in Adequate Light: 2 Moderately impaired Patient Visual Report: No change from baseline              Pertinent Vitals/Pain Pain Assessment: Faces Faces Pain Scale: Hurts little more Pain Location: ABD pain Pain Descriptors / Indicators: Aching;Cramping Pain Intervention(s): Monitored during  session        Extremity/Trunk Assessment Upper Extremity Assessment Upper Extremity Assessment: Overall WFL for tasks assessed   Lower Extremity Assessment Lower Extremity Assessment: Overall WFL for tasks assessed       Communication Communication Communication: No difficulties   Cognition Arousal/Alertness:  Awake/alert Behavior During Therapy: WFL for tasks assessed/performed Overall Cognitive Status: Within Functional Limits for tasks assessed                                 General Comments: A&Ox4                Home Living Family/patient expects to be discharged to:: Private residence Living Arrangements: Alone Available Help at Discharge: Family (Pt's grandDTR is looking after her Shitshu right now, but pt doesn't acknowledge that she might need help with IADL at DC.) Type of Home: Apartment Home Access: Level entry     Home Layout: One level     Bathroom Shower/Tub: Producer, television/film/video: Standard     Home Equipment: Grab bars - tub/shower;Shower seat          Prior Functioning/Environment Prior Level of Function : Independent/Modified Independent             Mobility Comments: independent without AD for functional mobility. Denies any recent falls ADLs Comments: independent with ADLs/IADLs. Pt drives during the day and cares for dog        OT Problem List: Decreased strength;Decreased activity tolerance;Impaired balance (sitting and/or standing)         OT Goals(Current goals can be found in the care plan section) Acute Rehab OT Goals Patient Stated Goal: to return to being home with dog OT Goal Formulation: With patient Time For Goal Achievement: 04/20/21 Potential to Achieve Goals: Good ADL Goals Pt Will Perform Grooming: with modified independence;standing (x3 standing grooming tasks) Pt Will Perform Lower Body Bathing: with modified independence;sit to/from stand Pt Will Transfer to Toilet: with modified independence;ambulating;regular height toilet  OT Frequency: Min 2X/week    AM-PAC OT "6 Clicks" Daily Activity     Outcome Measure Help from another person eating meals?: None Help from another person taking care of personal grooming?: A Little Help from another person toileting, which includes using toliet, bedpan, or  urinal?: A Little Help from another person bathing (including washing, rinsing, drying)?: A Little Help from another person to put on and taking off regular upper body clothing?: None Help from another person to put on and taking off regular lower body clothing?: A Little 6 Click Score: 20   End of Session Nurse Communication: Mobility status  Activity Tolerance: Patient tolerated treatment well Patient left: in chair;with call bell/phone within reach  OT Visit Diagnosis: Muscle weakness (generalized) (M62.81)                Time: 3220-2542 OT Time Calculation (min): 17 min Charges:  OT General Charges $OT Visit: 1 Visit OT Evaluation $OT Eval Moderate Complexity: 1 Mod  Matthew Folks, OTR/L [facility] [phone]

## 2021-04-06 NOTE — TOC Progression Note (Signed)
Transition of Care [facility]) - Progression Note    Patient Details  Name: Eileen Mack MRN: [id number] Date of Birth: May 19, 1935  Transition of [facility]) CM/SW [place], RN Phone Number: 04/06/2021, 4:42 PM  Clinical Narrative:     Met with the patient in the room to discuss DC plan and needs She lives at home with her little dog alone in an apartment She still drives during the day, she will have transportation from her family if needed as well,  she needs a RW  one will be delivered to her room prior to dc She stated that she does not need a potty seat She can afford her medication  [facility], [facility] all unable to accept [place], I sent the referral to [place] at [facility] requesting them to accept the patient, awaiting a response       Expected Discharge Plan and Services                                                 Social Determinants of Health (SDOH) Interventions    Readmission Risk Interventions No flowsheet data found.

## 2021-04-07 ENCOUNTER — Inpatient Hospital Stay: Payer: Medicare HMO

## 2021-04-07 ENCOUNTER — Telehealth: Payer: Medicare HMO

## 2021-04-07 DIAGNOSIS — K9089 Other intestinal malabsorption: Secondary | ICD-10-CM

## 2021-04-07 DIAGNOSIS — A084 Viral intestinal infection, unspecified: Secondary | ICD-10-CM | POA: Diagnosis not present

## 2021-04-07 LAB — CBC
HCT: 31.6 % — ABNORMAL LOW (ref 36.0–46.0)
Hemoglobin: 10.2 g/dL — ABNORMAL LOW (ref 12.0–15.0)
MCH: 28.8 pg (ref 26.0–34.0)
MCHC: 32.3 g/dL (ref 30.0–36.0)
MCV: 89.3 fL (ref 80.0–100.0)
Platelets: 272 10*3/uL (ref 150–400)
RBC: 3.54 MIL/uL — ABNORMAL LOW (ref 3.87–5.11)
RDW: 14.2 % (ref 11.5–15.5)
WBC: 11.4 10*3/uL — ABNORMAL HIGH (ref 4.0–10.5)
nRBC: 0 % (ref 0.0–0.2)

## 2021-04-07 LAB — BASIC METABOLIC PANEL
Anion gap: 10 (ref 5–15)
BUN: 24 mg/dL — ABNORMAL HIGH (ref 8–23)
CO2: 23 mmol/L (ref 22–32)
Calcium: 7.8 mg/dL — ABNORMAL LOW (ref 8.9–10.3)
Chloride: 104 mmol/L (ref 98–111)
Creatinine, Ser: 0.95 mg/dL (ref 0.44–1.00)
GFR, Estimated: 60 mL/min — ABNORMAL LOW (ref >60)
Glucose, Bld: 194 mg/dL — ABNORMAL HIGH (ref 70–99)
Potassium: 3.3 mmol/L — ABNORMAL LOW (ref 3.5–5.1)
Sodium: 137 mmol/L (ref 135–145)

## 2021-04-07 LAB — GLUCOSE, CAPILLARY
Glucose-Capillary: 233 mg/dL — ABNORMAL HIGH (ref 70–99)
Glucose-Capillary: 91 mg/dL (ref 70–99)
Glucose-Capillary: 126 mg/dL — ABNORMAL HIGH (ref 70–99)
Glucose-Capillary: 111 mg/dL — ABNORMAL HIGH (ref 70–99)

## 2021-04-07 LAB — BLOOD GAS, ARTERIAL
Acid-base deficit: 4 mmol/L — ABNORMAL HIGH (ref 0.0–2.0)
Bicarbonate: 23.9 mmol/L (ref 20.0–28.0)
FIO2: 1
O2 Saturation: 85.1 %
Patient temperature: 37
pCO2 arterial: 57 mmHg — ABNORMAL HIGH (ref 32.0–48.0)
pH, Arterial: 7.23 — ABNORMAL LOW (ref 7.350–7.450)
pO2, Arterial: 60 mmHg — ABNORMAL LOW (ref 83.0–108.0)

## 2021-04-07 LAB — BRAIN NATRIURETIC PEPTIDE: B Natriuretic Peptide: 725.9 pg/mL — ABNORMAL HIGH (ref 0.0–100.0)

## 2021-04-07 LAB — D-DIMER, QUANTITATIVE: D-Dimer, Quant: 3.55 ug/mL-FEU — ABNORMAL HIGH (ref 0.00–0.50)

## 2021-04-07 LAB — TROPONIN I (HIGH SENSITIVITY)
Troponin I (High Sensitivity): 50 ng/L — ABNORMAL HIGH (ref <18)
Troponin I (High Sensitivity): 102 ng/L (ref <18)

## 2021-04-07 LAB — MAGNESIUM: Magnesium: 1.4 mg/dL — ABNORMAL LOW (ref 1.7–2.4)

## 2021-04-07 MED ORDER — FUROSEMIDE 10 MG/ML IJ SOLN
40.0000 mg | Freq: Once | INTRAMUSCULAR | Status: DC
  Filled 2021-04-07: qty 4

## 2021-04-07 MED ORDER — POTASSIUM CHLORIDE CRYS ER 20 MEQ PO TBCR
40.0000 meq | EXTENDED_RELEASE_TABLET | ORAL | Status: DC
  Administered 2021-04-07: 40 meq via ORAL
  Filled 2021-04-07: qty 2

## 2021-04-07 MED ORDER — TECHNETIUM TO 99M ALBUMIN AGGREGATED
4.3400 | Freq: Once | INTRAVENOUS | Status: AC | PRN
  Administered 2021-04-07: 4.34 via INTRAVENOUS

## 2021-04-07 MED ORDER — CHLORHEXIDINE GLUCONATE CLOTH 2 % EX PADS: 6.0000 | MEDICATED_PAD | Freq: Every day | CUTANEOUS | Status: DC

## 2021-04-07 MED ORDER — MAGNESIUM SULFATE 2 GM/50ML IV SOLN
2.0000 g | Freq: Once | INTRAVENOUS | Status: AC
  Administered 2021-04-07: 2 g via INTRAVENOUS
  Filled 2021-04-07: qty 50

## 2021-04-07 MED ORDER — FUROSEMIDE 10 MG/ML IJ SOLN
INTRAMUSCULAR | Status: AC
  Administered 2021-04-07: 40 mg via INTRAVENOUS
  Filled 2021-04-07: qty 4

## 2021-04-07 MED ORDER — FUROSEMIDE 10 MG/ML IJ SOLN: 40.0000 mg | Freq: Once | INTRAMUSCULAR | Status: AC

## 2021-04-07 MED ORDER — FLUTICASONE PROPIONATE 50 MCG/ACT NA SUSP
1.0000 | Freq: Every day | NASAL | Status: DC
Start: 2021-04-07 — End: 2021-04-08
  Administered 2021-04-07: 1 via NASAL
  Filled 2021-04-07: qty 16

## 2021-04-07 MED ORDER — HYDRALAZINE HCL 20 MG/ML IJ SOLN
10.0000 mg | Freq: Once | INTRAMUSCULAR | Status: AC
  Administered 2021-04-07: 10 mg via INTRAVENOUS

## 2021-04-07 NOTE — Progress Notes (Signed)
   04/07/21 0256  Assess: MEWS Score  BP (!) 175/127  Pulse Rate (!) 128  SpO2 (!) 75 %  O2 Device Non-rebreather Mask  Assess: MEWS Score  MEWS Temp 0  MEWS Systolic 0  MEWS Pulse 2  MEWS RR 0  MEWS LOC 0  MEWS Score 2  MEWS Score Color Yellow  Assess: if the MEWS score is Yellow or Red  Were vital signs taken at a resting state? Yes  Focused Assessment Change from prior assessment (see assessment flowsheet)  Early Detection of Sepsis Score *See Row Information* Low  MEWS guidelines implemented *See Row Information* Yes  Treat  MEWS Interventions Consulted Respiratory Therapy;Escalated (See documentation below)  Pain Scale 0-10  Pain Score 0  Pain Intervention(s) Medication (See eMAR)  Notify: Provider  Provider Name/Title Dr Precious Reel, MD  Date Provider Notified 04/07/21  Time Provider Notified [phone]  Notification Type Face-to-face  Notification Reason Change in status  Provider response At bedside;See new orders  Date of Provider Response 04/07/21  Time of Provider Response 0253  Notify: Rapid Response  Name of Rapid Response RN Notified Byrd Hesselbach, RN  Date Rapid Response Notified 04/07/21  Time Rapid Response Notified 0253  Document  Patient Outcome Transferred/level of care increased  Progress note created (see row info) Yes  Assess: SIRS CRITERIA  SIRS Temperature  0  SIRS Pulse 1  SIRS Respirations  0  SIRS WBC 0  SIRS Score Sum  1

## 2021-04-07 NOTE — Progress Notes (Signed)
OT Cancellation Note  Patient Details Name: KARRIN EISENMENGER MRN: [id number] DOB: January 24, 1936   Cancelled Treatment:    Reason Eval/Treat Not Completed: Other (comment). Thank you for the OT consult. Order received and chart reviewed. Per chart review, pt noted to have had rapid response this AM, has transitioned to higher level care. Per therapy protocols, will require new orders to initiate therapy services. OT to sign off. Please re-consult when pt medically appropriate.    Matthew Folks, OTR/L [facility] [phone]

## 2021-04-07 NOTE — Progress Notes (Signed)
PT Cancellation Note  Patient Details Name: Eileen Mack MRN: [id number] DOB: 04/25/1936   Cancelled Treatment:    Reason Eval/Treat Not Completed: Medical issues which prohibited therapy (Pt moved to higher level of care, acute respiratory failure, workup pending. WIll sign off and resume services once medically appropriate. Please place new PT order as needed.)  8:24 AM, 04/07/21 Rosamaria Lints, PT, DPT Physical Therapist - [facility]  [phone] (ASCOM)    Tenoch Mcclure C 04/07/2021, 8:23 AM

## 2021-04-07 NOTE — Significant Event (Addendum)
Rapid Response Event Note   Reason for Call :  Resp Failure Initial Focused Assessment:  Patient with increased work of breathing on a NRB up in recliner. Patient anxious with O2 in the 70s. BP 197/115 HR 140s. Patient was just up to bathroom. Dr.Kakrakandy at bedside during rapid. Breathing treatment given prior to rapid being called.     Interventions:  -Lasix's and hydralazine given -ABG -Transfer to SDU for Bipap. -Chest xray -EKG -Labs ordered  Plan of Care:  -Bipap  -Awaiting results of labs   Event Summary:   MD Notified: Dr Toniann Fail 0252 Call Time:0248 Arrival Time:0250 End [id number]  Judyann Munson, RN

## 2021-04-07 NOTE — Progress Notes (Incomplete)
PROGRESS NOTE  Eileen Mack    DOB: 01-04-1936, 85 y.o.  STM:[id number]  PCP: Erasmo Downer, MD   Code Status: DNR   DOA: 04/03/2021   LOS: 2  Brief Narrative of Current Hospitalization  Eileen Mack is a 85 y.o. female with a PMH significant for HTN, HLD, prediabetes, COPD, GERD, hypothyroidism, depression, CKDIIIa, tobacco use, CAD, Afib s/p ablation on anticoagulation, AAA, varicose veins of legs, IDA, LBBB. They presented from home to the ED on 04/03/2021 with N/V/abdominal pain x several days. In the ED, it was found that they had likely viral gastroenteritis meeting SIRS criteria and dehydration. They were treated with antiemetics, zosyn, IV fluids. A GI panel collected and resulted as negative. .  Patient was admitted to medicine service for further workup and management of gastritis as outlined in detail below. Patient progressed well with her deconditioning and was planning to be discharged 12/7 with HHPT. However, overnight 12/6, she experienced SOB with hypoxia which required bipap and Afib RVR. She was transferred to progressive. Chest xray revealed pulmonary congestion, ECG showed sinus tachycardia.  04/07/21 -patient was stable and oriented so bipap was removed. She was started on 2L [place] and denies respiratory complaints or chest pain. She had good urine output response to lasix.   Assessment & Plan  Principal Problem:   Acute gastroenteritis Active Problems:   Iron deficiency anemia due to chronic blood loss   Atrial fibrillation with RVR (HCC)   Hypothyroidism   GERD (gastroesophageal reflux disease)   Tobacco abuse   Prediabetes   Hypertension   Hyperlipidemia   COPD (chronic obstructive pulmonary disease) (HCC)   Depression   Acute renal failure superimposed on stage 3a chronic kidney disease (HCC)   SIRS (systemic inflammatory response syndrome) (HCC)   Hypokalemia   Gastroenteritis  New onset acute respiratory distress with hypoxia- 2/2  pulmonary edema. Removed from bipap and placed on 2L [place]. Conversational and maintaining sats of 100%.  - goal >88% O2 saturation  - wean to room air as tolerated.  - repeat lasix dose today - ambulate with pulse ox - wean to room air as tolerated  *** -   *** -   *** -   *** -   DVT prophylaxis: enoxaparin (LOVENOX) injection 40 mg Start: 04/06/21 1000   Diet:  Diet Orders (From admission, onward)     Start     Ordered   04/06/21 1353  Diet Heart Room service appropriate? Yes; Fluid consistency: Thin  Diet effective now       Question Answer Comment  Room service appropriate? Yes   Fluid consistency: Thin      04/06/21 1352            Subjective 04/07/21    Pt reports really wants to go home today to see her "baby" (dog).   Disposition Plan & Communication  Patient status: Inpatient  Admitted From: {From:23814} Disposition: {PLAN; DISPOSITION:26386} Anticipated discharge date: ***  Family Communication: ***  Consults, Procedures, Significant Events  Consultants:  ***  Procedures/significant events:  *** Antimicrobials:  Anti-infectives (From admission, onward)    Start     Dose/Rate Route Frequency Ordered Stop   04/06/21 2200  amoxicillin-clavulanate (AUGMENTIN) 875-125 MG per tablet 1 tablet        1 tablet Oral Every 12 hours 04/06/21 1526     04/04/21 2100  piperacillin-tazobactam (ZOSYN) IVPB 3.375 g  Status:  Discontinued        3.375 g 12.5  mL/hr over 240 Minutes Intravenous Every 8 hours 04/04/21 2029 04/06/21 1526   04/04/21 1915  piperacillin-tazobactam (ZOSYN) IVPB 3.375 g  Status:  Discontinued        3.375 g 12.5 mL/hr over 240 Minutes Intravenous  Once 04/04/21 1819 04/04/21 2029       Objective   Vitals:   04/07/21 0400 04/07/21 0500 04/07/21 0600 04/07/21 0700  BP: 140/81 137/74 112/61 (!) 149/72  Pulse: (!) 122 94 76 86  Resp:      Temp: 98.1 F (36.7 C)     TempSrc: Axillary     SpO2: 99% 100% 100% 100%  Weight:       Height:        Intake/Output Summary (Last 24 hours) at 04/07/2021 0731 Last data filed at 04/07/2021 0700 Gross per 24 hour  Intake --  Output 850 ml  Net -850 ml   Filed Weights   04/03/21 2219 04/04/21 1050  Weight: 68 kg 68 kg    Patient BMI: Body mass index is 23.49 kg/m.   Physical Exam: General: awake, alert, NAD HEENT: atraumatic, clear conjunctiva, anicteric sclera, ***moist mucus membranes, hearing grossly normal Respiratory: normal respiratory effort. Cardiovascular: normal S1/S2, *** RRR, no JVD, murmurs, rubs, gallops, ***quick capillary refill  Gastrointestinal: soft, NT, ND, no HSM felt Nervous: A&O x3***. no gross focal neurologic deficits, normal speech Extremities: moves all equally***, no*** edema, normal tone Skin: dry, intact, normal temperature, normal color, ***No rashes, lesions or ulcers Psychiatry: normal mood, congruent affect  Labs   I have personally reviewed following labs and imaging studies Admission on 04/03/2021  Component Date Value Ref Range Status   Sodium 04/03/2021 135  135 - 145 mmol/L Final   Potassium 04/03/2021 3.0 (L)  3.5 - 5.1 mmol/L Final   Chloride 04/03/2021 94 (L)  98 - 111 mmol/L Final   CO2 04/03/2021 25  22 - 32 mmol/L Final   Glucose, Bld 04/03/2021 257 (H)  70 - 99 mg/dL Final   BUN 16/01/9603 40 (H)  8 - 23 mg/dL Final   Creatinine, Ser 04/03/2021 1.77 (H)  0.44 - 1.00 mg/dL Final   Calcium 54/12/8117 8.4 (L)  8.9 - 10.3 mg/dL Final   Total Protein 14/78/2956 7.6  6.5 - 8.1 g/dL Final   Albumin 21/30/8657 3.3 (L)  3.5 - 5.0 g/dL Final   AST 84/69/6295 37  15 - 41 U/L Final   ALT 04/03/2021 20  0 - 44 U/L Final   Alkaline Phosphatase 04/03/2021 117  38 - 126 U/L Final   Total Bilirubin 04/03/2021 0.7  0.3 - 1.2 mg/dL Final   GFR, Estimated 04/03/2021 28 (L)  >60 mL/min Final   Anion gap 04/03/2021 16 (H)  5 - 15 Final   Lipase 04/03/2021 30  11 - 51 U/L Final   WBC 04/03/2021 13.8 (H)  4.0 - 10.5 K/uL Final   RBC  04/03/2021 4.63  3.87 - 5.11 MIL/uL Final   Hemoglobin 04/03/2021 13.5  12.0 - 15.0 g/dL Final   HCT 28/41/3244 40.2  36.0 - 46.0 % Final   MCV 04/03/2021 86.8  80.0 - 100.0 fL Final   MCH 04/03/2021 29.2  26.0 - 34.0 pg Final   MCHC 04/03/2021 33.6  30.0 - 36.0 g/dL Final   RDW 05/04/7251 14.2  11.5 - 15.5 % Final   Platelets 04/03/2021 409 (H)  150 - 400 K/uL Final   nRBC 04/03/2021 0.0  0.0 - 0.2 % Final   Neutrophils Relative %  04/03/2021 88  % Final   Neutro Abs 04/03/2021 12.0 (H)  1.7 - 7.7 K/uL Final   Lymphocytes Relative 04/03/2021 6  % Final   Lymphs Abs 04/03/2021 0.8  0.7 - 4.0 K/uL Final   Monocytes Relative 04/03/2021 5  % Final   Monocytes Absolute 04/03/2021 0.8  0.1 - 1.0 K/uL Final   Eosinophils Relative 04/03/2021 0  % Final   Eosinophils Absolute 04/03/2021 0.1  0.0 - 0.5 K/uL Final   Basophils Relative 04/03/2021 0  % Final   Basophils Absolute 04/03/2021 0.1  0.0 - 0.1 K/uL Final   Immature Granulocytes 04/03/2021 1  % Final   Abs Immature Granulocytes 04/03/2021 0.08 (H)  0.00 - 0.07 K/uL Final   SARS Coronavirus 2 by RT PCR 04/03/2021 NEGATIVE  NEGATIVE Final   Influenza A by PCR 04/03/2021 NEGATIVE  NEGATIVE Final   Influenza B by PCR 04/03/2021 NEGATIVE  NEGATIVE Final   Specimen Description 04/04/2021 BLOOD RIGHT ANTECUBITAL   Final   Special Requests 04/04/2021 BOTTLES DRAWN AEROBIC AND ANAEROBIC Blood Culture results may not be optimal due to an inadequate volume of blood received in culture bottles   Final   Culture 04/04/2021    Final                   Value:NO GROWTH 2 DAYS Performed at [facility], [address] [place], [place] [address]    Report Status 04/04/2021 PENDING   Incomplete   Lactic Acid, Venous 04/04/2021 3.8 (HH)  0.5 - 1.9 mmol/L Final   Lactic Acid, Venous 04/04/2021 2.2 (HH)  0.5 - 1.9 mmol/L Final   Prothrombin Time 04/04/2021 15.7 (H)  11.4 - 15.2 seconds Final   INR 04/04/2021 1.3 (H)  0.8 - 1.2 Final   Magnesium  04/04/2021 1.1 (L)  1.7 - 2.4 mg/dL Final   Campylobacter species 04/05/2021 NOT DETECTED  NOT DETECTED Final   Plesimonas shigelloides 04/05/2021 NOT DETECTED  NOT DETECTED Final   Salmonella species 04/05/2021 NOT DETECTED  NOT DETECTED Final   Yersinia enterocolitica 04/05/2021 NOT DETECTED  NOT DETECTED Final   Vibrio species 04/05/2021 NOT DETECTED  NOT DETECTED Final   Vibrio cholerae 04/05/2021 NOT DETECTED  NOT DETECTED Final   Enteroaggregative E coli (EAEC) 04/05/2021 NOT DETECTED  NOT DETECTED Final   Enteropathogenic E coli (EPEC) 04/05/2021 NOT DETECTED  NOT DETECTED Final   Enterotoxigenic E coli (ETEC) 04/05/2021 NOT DETECTED  NOT DETECTED Final   Shiga like toxin producing E coli * 04/05/2021 NOT DETECTED  NOT DETECTED Final   Shigella/Enteroinvasive E coli (EI* 04/05/2021 NOT DETECTED  NOT DETECTED Final   Cryptosporidium 04/05/2021 NOT DETECTED  NOT DETECTED Final   Cyclospora cayetanensis 04/05/2021 NOT DETECTED  NOT DETECTED Final   Entamoeba histolytica 04/05/2021 NOT DETECTED  NOT DETECTED Final   Giardia lamblia 04/05/2021 NOT DETECTED  NOT DETECTED Final   Adenovirus F40/41 04/05/2021 NOT DETECTED  NOT DETECTED Final   Astrovirus 04/05/2021 NOT DETECTED  NOT DETECTED Final   Norovirus GI/GII 04/05/2021 NOT DETECTED  NOT DETECTED Final   Rotavirus A 04/05/2021 NOT DETECTED  NOT DETECTED Final   Sapovirus (I, II, IV, and V) 04/05/2021 NOT DETECTED  NOT DETECTED Final   Procalcitonin 04/04/2021 0.35  ng/mL Final   Glucose-Capillary 04/04/2021 180 (H)  70 - 99 mg/dL Final   Glucose-Capillary 04/04/2021 131 (H)  70 - 99 mg/dL Final   Comment 1 60/45/4098 Notify RN   Final   Comment 2  04/04/2021 Document in Chart   Final   Sodium 04/05/2021 137  135 - 145 mmol/L Final   Potassium 04/05/2021 2.9 (L)  3.5 - 5.1 mmol/L Final   Chloride 04/05/2021 105  98 - 111 mmol/L Final   CO2 04/05/2021 24  22 - 32 mmol/L Final   Glucose, Bld 04/05/2021 126 (H)  70 - 99 mg/dL Final    BUN 16/94/5038 39 (H)  8 - 23 mg/dL Final   Creatinine, Ser 04/05/2021 1.10 (H)  0.44 - 1.00 mg/dL Final   Calcium 88/28/0034 7.1 (L)  8.9 - 10.3 mg/dL Final   GFR, Estimated 04/05/2021 50 (L)  >60 mL/min Final   Anion gap 04/05/2021 8  5 - 15 Final   WBC 04/05/2021 6.5  4.0 - 10.5 K/uL Final   RBC 04/05/2021 3.38 (L)  3.87 - 5.11 MIL/uL Final   Hemoglobin 04/05/2021 9.8 (L)  12.0 - 15.0 g/dL Final   HCT 91/79/1505 29.9 (L)  36.0 - 46.0 % Final   MCV 04/05/2021 88.5  80.0 - 100.0 fL Final   MCH 04/05/2021 29.0  26.0 - 34.0 pg Final   MCHC 04/05/2021 32.8  30.0 - 36.0 g/dL Final   RDW 69/79/4801 14.3  11.5 - 15.5 % Final   Platelets 04/05/2021 252  150 - 400 K/uL Final   nRBC 04/05/2021 0.0  0.0 - 0.2 % Final   Lactic Acid, Venous 04/04/2021 2.0 (HH)  0.5 - 1.9 mmol/L Final   Lactic Acid, Venous 04/04/2021 1.8  0.5 - 1.9 mmol/L Final   Glucose-Capillary 04/04/2021 162 (H)  70 - 99 mg/dL Final   Comment 1 65/53/7482 Notify RN   Final   Comment 2 04/04/2021 Document in Chart   Final   Specimen Description 04/05/2021 BLOOD LEFT WRIST   Final   Special Requests 04/05/2021 BOTTLES DRAWN AEROBIC AND ANAEROBIC Blood Culture adequate volume   Final   Culture 04/05/2021    Final                   Value:NO GROWTH 1 DAY Performed at [facility], [address] [place], [place] [address]    Report Status 04/05/2021 PENDING   Incomplete   Glucose-Capillary 04/05/2021 112 (H)  70 - 99 mg/dL Final   Magnesium 75/44/9201 1.0 (L)  1.7 - 2.4 mg/dL Final   Glucose-Capillary 04/05/2021 100 (H)  70 - 99 mg/dL Final   Glucose-Capillary 04/05/2021 229 (H)  70 - 99 mg/dL Final   Glucose-Capillary 04/05/2021 120 (H)  70 - 99 mg/dL Final   WBC 00/71/2197 5.1  4.0 - 10.5 K/uL Final   RBC 04/06/2021 3.20 (L)  3.87 - 5.11 MIL/uL Final   Hemoglobin 04/06/2021 9.1 (L)  12.0 - 15.0 g/dL Final   HCT 58/83/2549 28.3 (L)  36.0 - 46.0 % Final   MCV 04/06/2021 88.4  80.0 - 100.0 fL Final   MCH 04/06/2021  28.4  26.0 - 34.0 pg Final   MCHC 04/06/2021 32.2  30.0 - 36.0 g/dL Final   RDW 82/64/1583 14.2  11.5 - 15.5 % Final   Platelets 04/06/2021 218  150 - 400 K/uL Final   nRBC 04/06/2021 0.0  0.0 - 0.2 % Final   Sodium 04/06/2021 136  135 - 145 mmol/L Final   Potassium 04/06/2021 3.8  3.5 - 5.1 mmol/L Final   Chloride 04/06/2021 104  98 - 111 mmol/L Final   CO2 04/06/2021 25  22 - 32 mmol/L Final   Glucose, Bld  04/06/2021 85  70 - 99 mg/dL Final   BUN 69/67/8938 29 (H)  8 - 23 mg/dL Final   Creatinine, Ser 04/06/2021 0.95  0.44 - 1.00 mg/dL Final   Calcium 02/15/5101 7.2 (L)  8.9 - 10.3 mg/dL Final   GFR, Estimated 04/06/2021 60 (L)  >60 mL/min Final   Anion gap 04/06/2021 7  5 - 15 Final   Glucose-Capillary 04/05/2021 88  70 - 99 mg/dL Final   Glucose-Capillary 04/05/2021 192 (H)  70 - 99 mg/dL Final   Comment 1 58/52/7782 Notify RN   Final   Glucose-Capillary 04/06/2021 127 (H)  70 - 99 mg/dL Final   Magnesium 42/35/3614 1.8  1.7 - 2.4 mg/dL Final   Glucose-Capillary 04/06/2021 163 (H)  70 - 99 mg/dL Final   WBC 43/15/4008 11.4 (H)  4.0 - 10.5 K/uL Final   RBC 04/07/2021 3.54 (L)  3.87 - 5.11 MIL/uL Final   Hemoglobin 04/07/2021 10.2 (L)  12.0 - 15.0 g/dL Final   HCT 67/61/9509 31.6 (L)  36.0 - 46.0 % Final   MCV 04/07/2021 89.3  80.0 - 100.0 fL Final   MCH 04/07/2021 28.8  26.0 - 34.0 pg Final   MCHC 04/07/2021 32.3  30.0 - 36.0 g/dL Final   RDW 32/67/1245 14.2  11.5 - 15.5 % Final   Platelets 04/07/2021 272  150 - 400 K/uL Final   nRBC 04/07/2021 0.0  0.0 - 0.2 % Final   Sodium 04/07/2021 137  135 - 145 mmol/L Final   Potassium 04/07/2021 3.3 (L)  3.5 - 5.1 mmol/L Final   Chloride 04/07/2021 104  98 - 111 mmol/L Final   CO2 04/07/2021 23  22 - 32 mmol/L Final   Glucose, Bld 04/07/2021 194 (H)  70 - 99 mg/dL Final   BUN 80/99/8338 24 (H)  8 - 23 mg/dL Final   Creatinine, Ser 04/07/2021 0.95  0.44 - 1.00 mg/dL Final   Calcium 25/08/3974 7.8 (L)  8.9 - 10.3 mg/dL Final   GFR,  Estimated 04/07/2021 60 (L)  >60 mL/min Final   Anion gap 04/07/2021 10  5 - 15 Final   Magnesium 04/07/2021 1.4 (L)  1.7 - 2.4 mg/dL Final   Glucose-Capillary 04/06/2021 110 (H)  70 - 99 mg/dL Final   Glucose-Capillary 04/06/2021 126 (H)  70 - 99 mg/dL Final   FIO2 73/41/9379 1.00   Final   Delivery systems 04/07/2021 NON-REBREATHER OXYGEN MASK   Final   pH, Arterial 04/07/2021 7.23 (L)  7.350 - 7.450 Final   pCO2 arterial 04/07/2021 57 (H)  32.0 - 48.0 mmHg Final   pO2, Arterial 04/07/2021 60 (L)  83.0 - 108.0 mmHg Final   Bicarbonate 04/07/2021 23.9  20.0 - 28.0 mmol/L Final   Acid-base deficit 04/07/2021 4.0 (H)  0.0 - 2.0 mmol/L Final   O2 Saturation 04/07/2021 85.1  % Final   Patient temperature 04/07/2021 37.0   Final   Collection site 04/07/2021 RIGHT RADIAL   Final   Sample type 04/07/2021 ARTERIAL DRAW   Final   Allens test (pass/fail) 04/07/2021 PASS  PASS Final   Troponin I (High Sensitivity) 04/07/2021 50 (H)  <18 ng/L Final   B Natriuretic Peptide 04/07/2021 725.9 (H)  0.0 - 100.0 pg/mL Final   D-Dimer, Quant 04/07/2021 3.55 (H)  0.00 - 0.50 ug/mL-FEU Final   Troponin I (High Sensitivity) 04/07/2021 102 (HH)  <18 ng/L Final    Imaging Studies  DG Chest Port 1 View  Result Date: 04/07/2021 CLINICAL DATA:  Dyspnea EXAM: PORTABLE CHEST 1 VIEW COMPARISON:  04/02/2020 FINDINGS: Lungs are symmetrically mildly hyperinflated. Diffuse interstitial pulmonary infiltrate has developed in the interval since prior examination in keeping with mild to moderate interstitial pulmonary edema. No pneumothorax or pleural effusion. Cardiac size is within normal limits. The central pulmonary arteries are enlarged, unchanged. Osseous structures are age-appropriate. IMPRESSION: Moderate interstitial pulmonary edema. COPD. Central pulmonary arterial enlargement in keeping with changes of pulmonary artery hypertension. Electronically Signed   By: Helyn Numbers M.D.   On: 04/07/2021 03:34   Medications    Scheduled Meds:  amoxicillin-clavulanate  1 tablet Oral Q12H   aspirin EC  81 mg Oral Daily   atorvastatin  40 mg Oral Daily   buPROPion  150 mg Oral Daily   carvedilol  25 mg Oral BID WC   Chlorhexidine Gluconate Cloth  6 each Topical Daily   cholestyramine  4 g Oral BID   enoxaparin (LOVENOX) injection  40 mg Subcutaneous Q24H   ferrous sulfate  325 mg Oral Q breakfast   fluticasone furoate-vilanterol  1 puff Inhalation Daily   gabapentin  600 mg Oral TID   gemfibrozil  600 mg Oral BID AC   insulin aspart  0-5 Units Subcutaneous QHS   insulin aspart  0-9 Units Subcutaneous TID WC   levothyroxine  112 mcg Oral QAC breakfast   multivitamin with minerals   Oral Daily   nicotine  21 mg Transdermal Daily   pantoprazole  40 mg Oral Daily   venlafaxine XR  225 mg Oral Daily   No recently discontinued medications to reconcile  LOS: 2 days   Time spent: >101min  Leeroy Bock, DO [facility] 04/07/2021, 7:31 AM   Please refer to amion to contact the [facility] Attending or Consulting provider for this pt  [email or web address] Available by Epic secure chat 7AM-7PM. If 7PM-7AM, please contact night-coverage

## 2021-04-07 NOTE — Progress Notes (Signed)
Patient alert and oriented x4, in NSR, on room air. Patient ambulated in ICU, tolerated well. Patient education/medication instructions provided to patient, granddaughter and spouse at pick up. Advised patient of importance of follow up appointment. Patient eager to go home, family educated on patient safety. Family informed walker left on unit,and discussed concern for patient falls. Patient family stated she thinks patient will be okay for the evening. Dan Humphreys will be picked up by family 04/08/21 from the ICU.

## 2021-04-07 NOTE — Progress Notes (Signed)
Eileen Mack , MD [address], [address], [place], [place], [address] [address], [place], [place], [address] Phone: [phone]  Fax: [phone]   Eileen Mack is being followed for diarrhea chronic  Day 1 of follow up   Subjective: No new complaints- discussed with nursing - no bowel movements since yesterday    Objective: Vital signs in last 24 hours: Vitals:   04/07/21 0500 04/07/21 0600 04/07/21 0700 04/07/21 0800  BP: 137/74 112/61 (!) 149/72 (!) 131/57  Pulse: 94 76 86 93  Resp:      Temp:    98.7 F (37.1 C)  TempSrc:    Oral  SpO2: 100% 100% 100% 96%  Weight:      Height:       Weight change:   Intake/Output Summary (Last 24 hours) at 04/07/2021 1142 Last data filed at 04/07/2021 0800 Gross per 24 hour  Intake --  Output 1050 ml  Net -1050 ml     Exam: Neuro : Drowsy but comfortable   Lab Results: @LABTEST2 @ Micro Results: Recent Results (from the past 240 hour(s))  Resp Panel by RT-PCR (Flu A&B, Covid) Nasopharyngeal Swab     Status: None   Collection Time: 04/03/21 10:38 PM   Specimen: Nasopharyngeal Swab; Nasopharyngeal(NP) swabs in vial transport medium  Result Value Ref Range Status   SARS Coronavirus 2 by RT PCR NEGATIVE NEGATIVE Final    Comment: (NOTE) SARS-CoV-2 target nucleic acids are NOT DETECTED.  The SARS-CoV-2 RNA is generally detectable in upper respiratory specimens during the acute phase of infection. The lowest concentration of SARS-CoV-2 viral copies this assay can detect is 138 copies/mL. A negative result does not preclude SARS-Cov-2 infection and should not be used as the sole basis for treatment or other patient management decisions. A negative result may occur with  improper specimen collection/handling, submission of specimen other than nasopharyngeal swab, presence of viral mutation(s) within the areas targeted by this assay, and inadequate number of viral copies(<138 copies/mL). A negative  result must be combined with clinical observations, patient history, and epidemiological information. The expected result is Negative.  Fact Sheet for Patients:  14/03/22  Fact Sheet for Healthcare Providers:  BloggerCourse.com  This test is no t yet approved or cleared by the [employer] FDA and  has been authorized for detection and/or diagnosis of SARS-CoV-2 by FDA under an Emergency Use Authorization (EUA). This EUA will remain  in effect (meaning this test can be used) for the duration of the COVID-19 declaration under Section 564(b)(1) of the Act, 21 U.S.C.section 360bbb-3(b)(1), unless the authorization is terminated  or revoked sooner.       Influenza A by PCR NEGATIVE NEGATIVE Final   Influenza B by PCR NEGATIVE NEGATIVE Final    Comment: (NOTE) The Xpert Xpress SARS-CoV-2/FLU/RSV plus assay is intended as an aid in the diagnosis of influenza from Nasopharyngeal swab specimens and should not be used as a sole basis for treatment. Nasal washings and aspirates are unacceptable for Xpert Xpress SARS-CoV-2/FLU/RSV testing.  Fact Sheet for Patients: [place]  Fact Sheet for Healthcare Providers: BloggerCourse.com  This test is not yet approved or cleared by the [employer] FDA and has been authorized for detection and/or diagnosis of SARS-CoV-2 by FDA under an Emergency Use Authorization (EUA). This EUA will remain in effect (meaning this test can be used) for the duration of the COVID-19 declaration under Section 564(b)(1) of the Act, 21 U.S.C. section 360bbb-3(b)(1), unless the authorization is terminated  or revoked.  Performed at [facility] [address] [place], [place] [address]   Culture, blood (x 2)     Status: None (Preliminary result)   Collection Time: 04/04/21 10:28 AM   Specimen: BLOOD  Result Value Ref Range Status    Specimen Description BLOOD RIGHT ANTECUBITAL  Final   Special Requests   Final    BOTTLES DRAWN AEROBIC AND ANAEROBIC Blood Culture results may not be optimal due to an inadequate volume of blood received in culture bottles   Culture   Final    NO GROWTH 3 DAYS Performed at [facility], [address], [place], [place] [address]    Report Status PENDING  Incomplete  Culture, blood (Routine X 2) w Reflex to ID Panel     Status: None (Preliminary result)   Collection Time: 04/05/21  5:37 AM   Specimen: BLOOD  Result Value Ref Range Status   Specimen Description BLOOD LEFT WRIST  Final   Special Requests   Final    BOTTLES DRAWN AEROBIC AND ANAEROBIC Blood Culture adequate volume   Culture   Final    NO GROWTH 2 DAYS Performed at [facility], [address] [place], [place] [address]    Report Status PENDING  Incomplete  Gastrointestinal Panel by PCR , Stool     Status: None   Collection Time: 04/05/21  1:12 PM   Specimen: Stool  Result Value Ref Range Status   Campylobacter species NOT DETECTED NOT DETECTED Final   Plesimonas shigelloides NOT DETECTED NOT DETECTED Final   Salmonella species NOT DETECTED NOT DETECTED Final   Yersinia enterocolitica NOT DETECTED NOT DETECTED Final   Vibrio species NOT DETECTED NOT DETECTED Final   Vibrio cholerae NOT DETECTED NOT DETECTED Final   Enteroaggregative E coli (EAEC) NOT DETECTED NOT DETECTED Final   Enteropathogenic E coli (EPEC) NOT DETECTED NOT DETECTED Final   Enterotoxigenic E coli (ETEC) NOT DETECTED NOT DETECTED Final   Shiga like toxin producing E coli (STEC) NOT DETECTED NOT DETECTED Final   Shigella/Enteroinvasive E coli (EIEC) NOT DETECTED NOT DETECTED Final   Cryptosporidium NOT DETECTED NOT DETECTED Final   Cyclospora cayetanensis NOT DETECTED NOT DETECTED Final   Entamoeba histolytica NOT DETECTED NOT DETECTED Final   Giardia lamblia NOT DETECTED NOT DETECTED Final   Adenovirus F40/41 NOT DETECTED NOT  DETECTED Final   Astrovirus NOT DETECTED NOT DETECTED Final   Norovirus GI/GII NOT DETECTED NOT DETECTED Final   Rotavirus A NOT DETECTED NOT DETECTED Final   Sapovirus (I, II, IV, and V) NOT DETECTED NOT DETECTED Final    Comment: Performed at [facility], [address] [place], [place] [address]   Studies/Results: DG Chest Port 1 View  Result Date: 04/07/2021 CLINICAL DATA:  Dyspnea EXAM: PORTABLE CHEST 1 VIEW COMPARISON:  04/02/2020 FINDINGS: Lungs are symmetrically mildly hyperinflated. Diffuse interstitial pulmonary infiltrate has developed in the interval since prior examination in keeping with mild to moderate interstitial pulmonary edema. No pneumothorax or pleural effusion. Cardiac size is within normal limits. The central pulmonary arteries are enlarged, unchanged. Osseous structures are age-appropriate. IMPRESSION: Moderate interstitial pulmonary edema. COPD. Central pulmonary arterial enlargement in keeping with changes of pulmonary artery hypertension. Electronically Signed   By: Helyn Numbers M.D.   On: 04/07/2021 03:34   Medications: I have reviewed the patient's current medications. Scheduled Meds:  amoxicillin-clavulanate  1 tablet Oral Q12H   aspirin EC  81 mg Oral Daily   atorvastatin  40 mg Oral Daily   buPROPion  150 mg Oral Daily   carvedilol  25 mg Oral BID WC   Chlorhexidine Gluconate Cloth  6 each Topical Daily   cholestyramine  4 g Oral BID   enoxaparin (LOVENOX) injection  40 mg Subcutaneous Q24H   ferrous sulfate  325 mg Oral Q breakfast   fluticasone  1 spray Each Nare Daily   fluticasone furoate-vilanterol  1 puff Inhalation Daily   gabapentin  600 mg Oral TID   gemfibrozil  600 mg Oral BID AC   insulin aspart  0-5 Units Subcutaneous QHS   insulin aspart  0-9 Units Subcutaneous TID WC   levothyroxine  112 mcg Oral QAC breakfast   multivitamin with minerals   Oral Daily   nicotine  21 mg Transdermal Daily   pantoprazole  40 mg Oral Daily    venlafaxine XR  225 mg Oral Daily   Continuous Infusions: PRN Meds:.acetaminophen, albuterol, dextromethorphan-guaiFENesin, diphenhydrAMINE, hydrALAZINE, nitroGLYCERIN   Assessment: Principal Problem:   Acute gastroenteritis Active Problems:   Iron deficiency anemia due to chronic blood loss   Atrial fibrillation with RVR (HCC)   Hypothyroidism   GERD (gastroesophageal reflux disease)   Tobacco abuse   Prediabetes   Hypertension   Hyperlipidemia   COPD (chronic obstructive pulmonary disease) (HCC)   Depression   Acute renal failure superimposed on stage 3a chronic kidney disease (HCC)   SIRS (systemic inflammatory response syndrome) (HCC)   Hypokalemia   Gastroenteritis  Eileen Mack 85 y.o. female was consulted for diarrhea ongoing for many years. Started her on Questran yesterday and since has not had a bowel movement , in ICU for hypercapnia and started on BIPAP.     Plan: Could decrease dose of questran to 1 pcket a day if she develops constipation  I will sign off.  Please call me if any further GI concerns or questions.  We would like to thank you for the opportunity to participate in the care of Shaquetta J Vandyne.    LOS: 2 days   Eileen Mood, MD 04/07/2021, 11:42 AM

## 2021-04-07 NOTE — Significant Event (Signed)
Was notified by the patient's nurse that patient is acutely becoming short of breath after returning from the bathroom.  On exam at bedside patient appears acutely short of breath and is on nonrebreather.  Denies any chest pain.  Blood pressure is 200/80 pulse is A. fib with heart rate around 125 bpm temperature 98.4 F respiration around 30/min. Chest has bilateral expiratory wheezes and crepitations. Heart S1-S2 heard. Abdomen appears soft. Neuro -alert and awake following commands. Extremities -mild edema.  I got ABG and chest x-ray and EKG and troponins and BNP and D-dimer. Chest x-ray shows moderate pulmonary edema. ABG shows pH of 7.23 PCO2 57 PO2 of 60% on nonrebreather. EKG shows sinus tachycardia LBBB.  Assessment - Acute respiratory failure with hypoxia likely from pulmonary edema with hypertensive urgency.  I have ordered hydralazine 10 mg IV and Lasix 40 mg IV.  We will transfer patient to stepdown and keep patient on BiPAP.  Since the D-dimer is elevated we will get VQ scan since patient is allergic to contrast.  We will continue to closely monitor.  We will closely monitor make sure patient not going to A. fib with RVR monitor showing sinus tach.  I tried reaching patient's family but unable to.  We will try again and try to up-to-date patient's condition.   Midge Minium

## 2021-04-08 ENCOUNTER — Telehealth: Payer: Self-pay

## 2021-04-08 NOTE — Discharge Summary (Signed)
Physician Discharge Summary  Eileen Mack ERD:[id number] DOB: 09/16/1935 DOA: 04/03/2021  PCP: Erasmo Downer, MD  Admit date: 04/03/2021 Discharge date: 04/08/2021  Admitted From: Home Disposition: Home  Recommendations for Outpatient Follow-up:  Follow up with PCP within 1-2 weeks to monitor electrolytes, especially K+ as she was mildly low day prior to discharge. Also, monitor respiratory status as she had transient case of pulmonary congestion with IV fluids and was treated with lasix 40mg  IV x1.  Physical therapy Equipment/Devices:none  Discharge Condition:improved, stable CODE STATUS:  Code Status: Prior  Regular healthy diet  Brief/Interim Summary: Pt presented initially for acute nausea and vomiting which was evaluated by GI. She was treated symptomatically and showed spontaneous improvement. The etiology was thought to be a viral gastroenteritis.  Throughout her stay, she had received IV fluids support for her acute illness and dehydration which ultimately seemed to have caused her some mild respiratory distress on the night prior to predicted discharge. She required oxygen supplementation and a few hours of bipap. Chest xray showed vascular congestion so she was treated with IV lasix and had a good UOP response. She quickly was able to wean off the bipap and oxygen. She was able to ambulate ORA without desaturation.  She had a mild and asymptomatic hypokalemia which was replaced but prevented from giving additional dose of lasix prior to discharge.  Patient requested to go home as she needed to take care of her dog.  Discharge Diagnoses:  Principal Problem:   Acute gastroenteritis Active Problems:   Iron deficiency anemia due to chronic blood loss   Atrial fibrillation with RVR (HCC)   Hypothyroidism   GERD (gastroesophageal reflux disease)   Tobacco abuse   Prediabetes   Hypertension   Hyperlipidemia   COPD (chronic obstructive pulmonary disease) (HCC)    Depression   Acute renal failure superimposed on stage 3a chronic kidney disease (HCC)   SIRS (systemic inflammatory response syndrome) (HCC)   Hypokalemia   Gastroenteritis    Allergies as of 04/07/2021       Reactions   Iodinated Diagnostic Agents Hives, Itching, Rash   She states she has to be premedicated. SPM   Sulfa Antibiotics Rash        Medication List     STOP taking these medications    amLODipine 5 MG tablet Commonly known as: NORVASC   cloNIDine 0.2 MG tablet Commonly known as: CATAPRES   gemfibrozil 600 MG tablet Commonly known as: LOPID   isosorbide mononitrate 30 MG 24 hr tablet Commonly known as: IMDUR   omeprazole 20 MG capsule Commonly known as: PRILOSEC       TAKE these medications    acetaminophen 650 MG CR tablet Commonly known as: TYLENOL Take 1,300 mg by mouth every 8 (eight) hours.   albuterol 108 (90 Base) MCG/ACT inhaler Commonly known as: VENTOLIN HFA Inhale 2 puffs into the lungs every 6 (six) hours as needed for wheezing or shortness of breath.   aspirin EC 81 MG tablet Take 81 mg by mouth daily.   atorvastatin 40 MG tablet Commonly known as: LIPITOR Take 1 tablet (40 mg total) by mouth daily.   buPROPion 150 MG 24 hr tablet Commonly known as: Wellbutrin XL Take 1 tablet (150 mg total) by mouth daily.   carvedilol 25 MG tablet Commonly known as: COREG Take 1 tablet (25 mg total) by mouth 2 (two) times daily with a meal.   famotidine 20 MG tablet Commonly known as: PEPCID Take  1 tablet (20 mg total) by mouth 2 (two) times daily.   ferrous sulfate 325 (65 FE) MG tablet Take 1 tablet (325 mg total) by mouth daily with breakfast.   fluticasone 50 MCG/ACT nasal spray Commonly known as: FLONASE Place into the nose.   fluticasone furoate-vilanterol 200-25 MCG/ACT Aepb Commonly known as: Breo Ellipta INHALE 1 PUFF IN THE MORNING   gabapentin 600 MG tablet Commonly known as: NEURONTIN Take 1 tablet (600 mg total)  by mouth 3 (three) times daily.   levothyroxine 112 MCG tablet Commonly known as: Euthyrox Take 1 tablet (112 mcg total) by mouth daily. OK for change in manufacturer   lidocaine 5 % Commonly known as: Lidoderm Place 1 patch onto the skin every 12 (twelve) hours. Remove & Discard patch within 12 hours or as directed by MD   loperamide 2 MG tablet Commonly known as: IMODIUM A-D Take 2 tablets (4 mg total) by mouth 4 (four) times daily as needed for diarrhea or loose stools.   MULTIVITAMIN GUMMIES ADULT PO Take 1 each by mouth daily.   nitroGLYCERIN 0.4 MG SL tablet Commonly known as: NITROSTAT Place 1 tablet (0.4 mg total) under the tongue every 5 (five) minutes as needed for chest pain.   ondansetron 4 MG disintegrating tablet Commonly known as: ZOFRAN-ODT Take 1 tablet (4 mg total) by mouth every 8 (eight) hours as needed for nausea or vomiting.   Venlafaxine HCl 225 MG Tb24 Take 1 tablet (225 mg total) by mouth daily.        Follow-up Information     Bacigalupo, Marzella Schlein, MD In 2 days.   Specialty: Family Medicine Contact information: [address] [place] [address] [phone]         [facility] EMERGENCY DEPARTMENT.   Specialty: Emergency Medicine Why: If symptoms worsen Contact information: [address] [id number] [address] [facility] [place] [address] [phone]               Allergies  Allergen Reactions   Iodinated Diagnostic Agents Hives, Itching and Rash    She states she has to be premedicated. SPM   Sulfa Antibiotics Rash    Consultations: GI  Procedures/Studies: CT ABDOMEN PELVIS WO CONTRAST  Result Date: 04/03/2021 CLINICAL DATA:  Abdominal pain, acute, nonlocalized. Nausea, vomiting, diarrhea. EXAM: CT ABDOMEN AND PELVIS WITHOUT CONTRAST TECHNIQUE: Multidetector CT imaging of the abdomen and pelvis was performed following the standard protocol without IV contrast. COMPARISON:   02/19/2018 FINDINGS: Lower chest: 9 mm ground-glass pulmonary nodule is seen within the visualized right lower lobe, incompletely evaluated on this examination, new since prior examination. Coronary artery calcification noted. Cardiac size within normal limits. Hepatobiliary: No focal liver abnormality is seen. Status post cholecystectomy. No biliary dilatation. Pancreas: Unremarkable Spleen: Unremarkable Adrenals/Urinary Tract: The adrenal glands are unremarkable. The kidneys are normal in size and position. Vascular calcifications noted within the hila bilaterally. Parapelvic cyst noted within the right kidney. No hydronephrosis. The bladder is decompressed and is unremarkable. Stomach/Bowel: Mild sigmoid diverticulosis. Stomach, small bowel, and large bowel are otherwise unremarkable. No evidence of obstruction or focal inflammation. Appendix absent. No free intraperitoneal gas or fluid. Vascular/Lymphatic: Status post endovascular repair of an infrarenal abdominal aortic aneurysm utilizing a bifurcated stent graft. This is not well assessed on this noncontrast examination. Aneurysm sac measures 4.1 x 4.6 cm, decreased when compared to prior examination. No pathologic adenopathy within the abdomen and pelvis. Reproductive: Status post hysterectomy. No adnexal masses. Other: Tiny fat  containing umbilical hernia.  Rectum unremarkable. Musculoskeletal: Osseous structures are diffusely osteopenic. No acute bone abnormality. Degenerative changes seen within the lumbar spine. No lytic or blastic bone lesions. IMPRESSION: No acute intra-abdominal pathology. No definite radiographic explanation for the patient's reported symptoms. 9 mm ground-glass pulmonary nodule within the right lower lobe, incompletely evaluated on this examination. Nonemergent dedicated CT imaging of the chest is recommended for further evaluation. Mild sigmoid diverticulosis. Interval endovascular stent graft repair of known infrarenal abdominal  aortic aneurysm with interval decrease in size of aneurysm sac now measuring 4.6 cm in greatest dimension. This is not optimally assessed on this noncontrast examination. Aortic aneurysm NOS (ICD10-I71.9). Electronically Signed   By: Helyn Numbers M.D.   On: 04/03/2021 23:11   NM Pulmonary Perfusion  Result Date: 04/07/2021 CLINICAL DATA:  Acute onset of shortness of breath. EXAM: NUCLEAR MEDICINE PERFUSION LUNG SCAN TECHNIQUE: Perfusion images were obtained in multiple projections after intravenous injection of radiopharmaceutical. Ventilation scans intentionally deferred if perfusion scan and chest x-ray adequate for interpretation during COVID 19 epidemic. RADIOPHARMACEUTICALS:  4.34 mCi Tc-21m MAA IV COMPARISON:  Chest x-ray 04/02/2020 and chest x-ray 04/07/2021 FINDINGS: Diffuse patchy perfusion abnormality in both lungs likely related to the chronic lung disease. There is a larger more discrete segmental perfusion defect in the right lower lobe anteriorly which is suspicious for pulmonary embolism. Based on strict criteria this is intermediate or indeterminate. A CTA chest for pulmonary embolism may be helpful for further evaluation if the patient can have contrast. IMPRESSION: Intermediate/indeterminate perfusion study. A follow-up CTA chest is suggested if the patient can have IV contrast. Electronically Signed   By: Rudie Meyer M.D.   On: 04/07/2021 13:59   DG Chest Port 1 View  Result Date: 04/07/2021 CLINICAL DATA:  Dyspnea EXAM: PORTABLE CHEST 1 VIEW COMPARISON:  04/02/2020 FINDINGS: Lungs are symmetrically mildly hyperinflated. Diffuse interstitial pulmonary infiltrate has developed in the interval since prior examination in keeping with mild to moderate interstitial pulmonary edema. No pneumothorax or pleural effusion. Cardiac size is within normal limits. The central pulmonary arteries are enlarged, unchanged. Osseous structures are age-appropriate. IMPRESSION: Moderate interstitial  pulmonary edema. COPD. Central pulmonary arterial enlargement in keeping with changes of pulmonary artery hypertension. Electronically Signed   By: Helyn Numbers M.D.   On: 04/07/2021 03:34    Subjective: Patient feels much improved. Denies N/V/D/abdominal pain. Her respiratory status seems to be at baseline. She is conversational and no DOE.   Discharge Exam: Vitals:   04/07/21 1700 04/07/21 1800  BP: (!) 143/74   Pulse: (!) 39 (!) 55  Resp: (!) 22 18  Temp:    SpO2: 90% 100%    General: Pt is alert, awake, not in acute distress Cardiovascular: RRR, S1/S2 +, no rubs, no gallops Respiratory: CTA bilaterally, no wheezing, no rhonchi Abdominal: Soft, NT, ND, bowel sounds + Extremities: trace LE edema, no cyanosis  Labs: Basic Metabolic Panel: Recent Labs  Lab 04/03/21 2230 04/04/21 1359 04/05/21 0537 04/06/21 0346 04/06/21 0858 04/07/21 0413  NA 135  --  137 136  --  137  K 3.0*  --  2.9* 3.8  --  3.3*  CL 94*  --  105 104  --  104  CO2 25  --  24 25  --  23  GLUCOSE 257*  --  126* 85  --  194*  BUN 40*  --  39* 29*  --  24*  CREATININE 1.77*  --  1.10* 0.95  --  0.95  CALCIUM 8.4*  --  7.1* 7.2*  --  7.8*  MG  --  1.1* 1.0*  --  1.8 1.4*   CBC: Recent Labs  Lab 04/03/21 2230 04/05/21 0537 04/06/21 0346 04/07/21 0413  WBC 13.8* 6.5 5.1 11.4*  NEUTROABS 12.0*  --   --   --   HGB 13.5 9.8* 9.1* 10.2*  HCT 40.2 29.9* 28.3* 31.6*  MCV 86.8 88.5 88.4 89.3  PLT 409* 252 218 272    Microbiology Recent Results (from the past 240 hour(s))  Resp Panel by RT-PCR (Flu A&B, Covid) Nasopharyngeal Swab     Status: None   Collection Time: 04/03/21 10:38 PM   Specimen: Nasopharyngeal Swab; Nasopharyngeal(NP) swabs in vial transport medium  Result Value Ref Range Status   SARS Coronavirus 2 by RT PCR NEGATIVE NEGATIVE Final    Comment: (NOTE) SARS-CoV-2 target nucleic acids are NOT DETECTED.  The SARS-CoV-2 RNA is generally detectable in upper respiratory specimens  during the acute phase of infection. The lowest concentration of SARS-CoV-2 viral copies this assay can detect is 138 copies/mL. A negative result does not preclude SARS-Cov-2 infection and should not be used as the sole basis for treatment or other patient management decisions. A negative result may occur with  improper specimen collection/handling, submission of specimen other than nasopharyngeal swab, presence of viral mutation(s) within the areas targeted by this assay, and inadequate number of viral copies(<138 copies/mL). A negative result must be combined with clinical observations, patient history, and epidemiological information. The expected result is Negative.  Fact Sheet for Patients:  BloggerCourse.com  Fact Sheet for Healthcare Providers:  SeriousBroker.it  This test is no t yet approved or cleared by the [place] FDA and  has been authorized for detection and/or diagnosis of SARS-CoV-2 by FDA under an Emergency Use Authorization (EUA). This EUA will remain  in effect (meaning this test can be used) for the duration of the COVID-19 declaration under Section 564(b)(1) of the Act, 21 U.S.C.section 360bbb-3(b)(1), unless the authorization is terminated  or revoked sooner.       Influenza A by PCR NEGATIVE NEGATIVE Final   Influenza B by PCR NEGATIVE NEGATIVE Final    Comment: (NOTE) The Xpert Xpress SARS-CoV-2/FLU/RSV plus assay is intended as an aid in the diagnosis of influenza from Nasopharyngeal swab specimens and should not be used as a sole basis for treatment. Nasal washings and aspirates are unacceptable for Xpert Xpress SARS-CoV-2/FLU/RSV testing.  Fact Sheet for Patients: BloggerCourse.com  Fact Sheet for Healthcare Providers: SeriousBroker.it  This test is not yet approved or cleared by the [place] FDA and has been authorized for detection  and/or diagnosis of SARS-CoV-2 by FDA under an Emergency Use Authorization (EUA). This EUA will remain in effect (meaning this test can be used) for the duration of the COVID-19 declaration under Section 564(b)(1) of the Act, 21 U.S.C. section 360bbb-3(b)(1), unless the authorization is terminated or revoked.  Performed at [facility], [address] [place], [place] [address]   Culture, blood (x 2)     Status: None (Preliminary result)   Collection Time: 04/04/21 10:28 AM   Specimen: BLOOD  Result Value Ref Range Status   Specimen Description BLOOD RIGHT ANTECUBITAL  Final   Special Requests   Final    BOTTLES DRAWN AEROBIC AND ANAEROBIC Blood Culture results may not be optimal due to an inadequate volume of blood received in culture bottles   Culture   Final    NO  GROWTH 4 DAYS Performed at [facility], [address] [place], [place] [address]    Report Status PENDING  Incomplete  Culture, blood (Routine X 2) w Reflex to ID Panel     Status: None (Preliminary result)   Collection Time: 04/05/21  5:37 AM   Specimen: BLOOD  Result Value Ref Range Status   Specimen Description BLOOD LEFT WRIST  Final   Special Requests   Final    BOTTLES DRAWN AEROBIC AND ANAEROBIC Blood Culture adequate volume   Culture   Final    NO GROWTH 3 DAYS Performed at [facility], [address] [place], [place] [address]    Report Status PENDING  Incomplete  Gastrointestinal Panel by PCR , Stool     Status: None   Collection Time: 04/05/21  1:12 PM   Specimen: Stool  Result Value Ref Range Status   Campylobacter species NOT DETECTED NOT DETECTED Final   Plesimonas shigelloides NOT DETECTED NOT DETECTED Final   Salmonella species NOT DETECTED NOT DETECTED Final   Yersinia enterocolitica NOT DETECTED NOT DETECTED Final   Vibrio species NOT DETECTED NOT DETECTED Final   Vibrio cholerae NOT DETECTED NOT DETECTED Final   Enteroaggregative E coli (EAEC) NOT DETECTED  NOT DETECTED Final   Enteropathogenic E coli (EPEC) NOT DETECTED NOT DETECTED Final   Enterotoxigenic E coli (ETEC) NOT DETECTED NOT DETECTED Final   Shiga like toxin producing E coli (STEC) NOT DETECTED NOT DETECTED Final   Shigella/Enteroinvasive E coli (EIEC) NOT DETECTED NOT DETECTED Final   Cryptosporidium NOT DETECTED NOT DETECTED Final   Cyclospora cayetanensis NOT DETECTED NOT DETECTED Final   Entamoeba histolytica NOT DETECTED NOT DETECTED Final   Giardia lamblia NOT DETECTED NOT DETECTED Final   Adenovirus F40/41 NOT DETECTED NOT DETECTED Final   Astrovirus NOT DETECTED NOT DETECTED Final   Norovirus GI/GII NOT DETECTED NOT DETECTED Final   Rotavirus A NOT DETECTED NOT DETECTED Final   Sapovirus (I, II, IV, and V) NOT DETECTED NOT DETECTED Final    Comment: Performed at [facility], [address] [place], [place] [address]    Time coordinating discharge: Over 30 minutes  Leeroy Bock, MD  [facility] 04/08/2021, 12:35 PM Pager   If 7PM-7AM, please contact night-coverage [email or web address] Password [email or web address]

## 2021-04-08 NOTE — Telephone Encounter (Signed)
Transition Care Management Follow-up Telephone Call Date of discharge and from where: 04/07/21 [facility] How have you been since you were released from the hospital? weak Any questions or concerns? No  Items Reviewed: Did the pt receive and understand the discharge instructions provided? Yes  Medications obtained and verified? Yes  Other? No  Any new allergies since your discharge? No  Dietary orders reviewed? No Do you have support at home? Yes   Home Care and Equipment/Supplies: Were home health services ordered? yes If so, what is the name of the agency? She can't recall Has the agency set up a time to come to the patient's home? no Were any new equipment or medical supplies ordered?  No   Functional Questionnaire: (I = Independent and D = Dependent) ADLs: I  Bathing/Dressing- I  Meal Prep- I  Eating- I  Maintaining continence- I  Transferring/Ambulation- I  Managing Meds- I  Follow up appointments reviewed:  PCP Hospital f/u appt confirmed? Yes  Scheduled to see M Flinchum on 12/12 @ 10:30am Specialist Hospital f/u appt confirmed? No   Are transportation arrangements needed? No  If their condition worsens, is the pt aware to call PCP or go to the Emergency Dept.? Yes Was the patient provided with contact information for the PCP's office or ED? Yes Was to pt encouraged to call back with questions or concerns? Yes

## 2021-04-09 LAB — CULTURE, BLOOD (ROUTINE X 2): Culture: NO GROWTH

## 2021-04-10 LAB — CULTURE, BLOOD (ROUTINE X 2)
Culture: NO GROWTH
Special Requests: ADEQUATE

## 2021-04-12 ENCOUNTER — Telehealth: Payer: Self-pay | Admitting: *Deleted

## 2021-04-12 ENCOUNTER — Ambulatory Visit (INDEPENDENT_AMBULATORY_CARE_PROVIDER_SITE_OTHER): Payer: Medicare HMO | Admitting: Adult Health

## 2021-04-12 ENCOUNTER — Encounter: Payer: Self-pay | Admitting: Adult Health

## 2021-04-12 ENCOUNTER — Telehealth: Payer: Self-pay | Admitting: Adult Health

## 2021-04-12 ENCOUNTER — Encounter: Payer: Self-pay | Admitting: Emergency Medicine

## 2021-04-12 ENCOUNTER — Other Ambulatory Visit: Payer: Self-pay

## 2021-04-12 ENCOUNTER — Emergency Department
Admission: EM | Admit: 2021-04-12 | Discharge: 2021-04-13 | Disposition: A | Discharge status: Home / Self Care | Payer: Medicare HMO | Attending: Emergency Medicine | Admitting: Emergency Medicine

## 2021-04-12 VITALS — BP 132/80 | HR 111 | Temp 95.4°F | Ht 67.01 in | Wt 162.4 lb

## 2021-04-12 DIAGNOSIS — R799 Abnormal finding of blood chemistry, unspecified: Secondary | ICD-10-CM | POA: Diagnosis present

## 2021-04-12 DIAGNOSIS — I2510 Atherosclerotic heart disease of native coronary artery without angina pectoris: Secondary | ICD-10-CM | POA: Insufficient documentation

## 2021-04-12 DIAGNOSIS — R7989 Other specified abnormal findings of blood chemistry: Secondary | ICD-10-CM | POA: Diagnosis not present

## 2021-04-12 DIAGNOSIS — R778 Other specified abnormalities of plasma proteins: Secondary | ICD-10-CM | POA: Diagnosis not present

## 2021-04-12 DIAGNOSIS — R918 Other nonspecific abnormal finding of lung field: Secondary | ICD-10-CM

## 2021-04-12 DIAGNOSIS — I10 Essential (primary) hypertension: Secondary | ICD-10-CM | POA: Diagnosis not present

## 2021-04-12 DIAGNOSIS — Z8679 Personal history of other diseases of the circulatory system: Secondary | ICD-10-CM | POA: Diagnosis not present

## 2021-04-12 DIAGNOSIS — Z79899 Other long term (current) drug therapy: Secondary | ICD-10-CM | POA: Insufficient documentation

## 2021-04-12 DIAGNOSIS — J449 Chronic obstructive pulmonary disease, unspecified: Secondary | ICD-10-CM | POA: Insufficient documentation

## 2021-04-12 DIAGNOSIS — E785 Hyperlipidemia, unspecified: Secondary | ICD-10-CM | POA: Diagnosis not present

## 2021-04-12 DIAGNOSIS — H547 Unspecified visual loss: Secondary | ICD-10-CM

## 2021-04-12 DIAGNOSIS — Z7982 Long term (current) use of aspirin: Secondary | ICD-10-CM | POA: Diagnosis not present

## 2021-04-12 DIAGNOSIS — F17210 Nicotine dependence, cigarettes, uncomplicated: Secondary | ICD-10-CM | POA: Diagnosis not present

## 2021-04-12 DIAGNOSIS — Z955 Presence of coronary angioplasty implant and graft: Secondary | ICD-10-CM | POA: Insufficient documentation

## 2021-04-12 DIAGNOSIS — E039 Hypothyroidism, unspecified: Secondary | ICD-10-CM | POA: Insufficient documentation

## 2021-04-12 DIAGNOSIS — Z91041 Radiographic dye allergy status: Secondary | ICD-10-CM

## 2021-04-12 DIAGNOSIS — R079 Chest pain, unspecified: Secondary | ICD-10-CM

## 2021-04-12 LAB — CBC WITH DIFFERENTIAL/PLATELET
Basophils Absolute: 0 10*3/uL (ref 0.0–0.1)
Basophils Relative: 0.7 % (ref 0.0–3.0)
Eosinophils Absolute: 0.2 10*3/uL (ref 0.0–0.7)
Eosinophils Relative: 2.7 % (ref 0.0–5.0)
HCT: 30.2 % — ABNORMAL LOW (ref 36.0–46.0)
Hemoglobin: 10.2 g/dL — ABNORMAL LOW (ref 12.0–15.0)
Lymphocytes Relative: 16.3 % (ref 12.0–46.0)
Lymphs Abs: 0.9 10*3/uL (ref 0.7–4.0)
MCHC: 33.6 g/dL (ref 30.0–36.0)
MCV: 87.3 fl (ref 78.0–100.0)
Monocytes Absolute: 0.6 10*3/uL (ref 0.1–1.0)
Monocytes Relative: 9.8 % (ref 3.0–12.0)
Neutro Abs: 4.1 10*3/uL (ref 1.4–7.7)
Neutrophils Relative %: 70.5 % (ref 43.0–77.0)
Platelets: 324 10*3/uL (ref 150.0–400.0)
RBC: 3.46 Mil/uL — ABNORMAL LOW (ref 3.87–5.11)
RDW: 15.7 % — ABNORMAL HIGH (ref 11.5–15.5)
WBC: 5.8 10*3/uL (ref 4.0–10.5)

## 2021-04-12 LAB — CBC
HCT: 27.8 % — ABNORMAL LOW (ref 36.0–46.0)
Hemoglobin: 9.1 g/dL — ABNORMAL LOW (ref 12.0–15.0)
MCH: 29.2 pg (ref 26.0–34.0)
MCHC: 32.7 g/dL (ref 30.0–36.0)
MCV: 89.1 fL (ref 80.0–100.0)
Platelets: 289 10*3/uL (ref 150–400)
RBC: 3.12 MIL/uL — ABNORMAL LOW (ref 3.87–5.11)
RDW: 15.1 % (ref 11.5–15.5)
WBC: 5.4 10*3/uL (ref 4.0–10.5)
nRBC: 0 % (ref 0.0–0.2)

## 2021-04-12 LAB — COMPREHENSIVE METABOLIC PANEL
ALT: 13 U/L (ref 0–35)
AST: 16 U/L (ref 0–37)
Albumin: 3.5 g/dL (ref 3.5–5.2)
Alkaline Phosphatase: 98 U/L (ref 39–117)
BUN: 19 mg/dL (ref 6–23)
CO2: 25 mEq/L (ref 19–32)
Calcium: 8.8 mg/dL (ref 8.4–10.5)
Chloride: 101 mEq/L (ref 96–112)
Creatinine, Ser: 1.01 mg/dL (ref 0.40–1.20)
GFR: 51.67 mL/min — ABNORMAL LOW (ref >60.00)
Glucose, Bld: 92 mg/dL (ref 70–99)
Potassium: 4.8 mEq/L (ref 3.5–5.1)
Sodium: 136 mEq/L (ref 135–145)
Total Bilirubin: 0.5 mg/dL (ref 0.2–1.2)
Total Protein: 6.2 g/dL (ref 6.0–8.3)

## 2021-04-12 LAB — BASIC METABOLIC PANEL
Anion gap: 7 (ref 5–15)
BUN: 21 mg/dL (ref 8–23)
CO2: 24 mmol/L (ref 22–32)
Calcium: 8.2 mg/dL — ABNORMAL LOW (ref 8.9–10.3)
Chloride: 103 mmol/L (ref 98–111)
Creatinine, Ser: 0.96 mg/dL (ref 0.44–1.00)
GFR, Estimated: 59 mL/min — ABNORMAL LOW (ref >60)
Glucose, Bld: 93 mg/dL (ref 70–99)
Potassium: 3.7 mmol/L (ref 3.5–5.1)
Sodium: 134 mmol/L — ABNORMAL LOW (ref 135–145)

## 2021-04-12 LAB — MAGNESIUM
Magnesium: 0.9 mg/dL — CL (ref 1.5–2.5)
Magnesium: 2 mg/dL (ref 1.7–2.4)

## 2021-04-12 LAB — BRAIN NATRIURETIC PEPTIDE: Pro B Natriuretic peptide (BNP): 244 pg/mL — ABNORMAL HIGH (ref 0.0–100.0)

## 2021-04-12 LAB — TROPONIN I (HIGH SENSITIVITY): Troponin I (High Sensitivity): 25 ng/L — ABNORMAL HIGH (ref <18)

## 2021-04-12 LAB — TSH: TSH: 15.17 u[IU]/mL — ABNORMAL HIGH (ref 0.35–5.50)

## 2021-04-12 LAB — D-DIMER, QUANTITATIVE: D-Dimer, Quant: 2.03 mcg/mL FEU — ABNORMAL HIGH (ref <0.50)

## 2021-04-12 MED ORDER — MAGNESIUM SULFATE 2 GM/50ML IV SOLN
2.0000 g | Freq: Once | INTRAVENOUS | Status: AC
  Administered 2021-04-13: 2 g via INTRAVENOUS
  Filled 2021-04-12: qty 50

## 2021-04-12 MED ORDER — MAGNESIUM SULFATE 2 GM/50ML IV SOLN
2.0000 g | Freq: Once | INTRAVENOUS | Status: AC
  Administered 2021-04-12: 2 g via INTRAVENOUS
  Filled 2021-04-12: qty 50

## 2021-04-12 MED ORDER — METHYLPREDNISOLONE SODIUM SUCC 125 MG IJ SOLR
125.0000 mg | Freq: Once | INTRAMUSCULAR | Status: AC
  Administered 2021-04-12: 125 mg via INTRAVENOUS
  Filled 2021-04-12: qty 2

## 2021-04-12 MED ORDER — PREDNISONE 10 MG PO TABS: ORAL_TABLET | ORAL | 0 refills | Status: DC

## 2021-04-12 MED ORDER — DIPHENHYDRAMINE HCL 50 MG/ML IJ SOLN
50.0000 mg | Freq: Once | INTRAMUSCULAR | Status: DC
  Filled 2021-04-12: qty 1

## 2021-04-12 MED ORDER — MAGNESIUM CHLORIDE 64 MG PO TBEC
2.0000 | DELAYED_RELEASE_TABLET | Freq: Three times a day (TID) | ORAL | 1 refills | Status: DC
Start: 2021-04-12 — End: 2021-04-15

## 2021-04-12 NOTE — ED Triage Notes (Signed)
Arrives for ED evaluation for low magnesium.  Mg level from today was 0.9  AAOx3.  Skin warm and Dy. NAD

## 2021-04-12 NOTE — Telephone Encounter (Signed)
Home Health Verbal Orders - Caller/Agency: maria/[facility] Callback Number:[phone] Requesting PT Frequency: [phone]

## 2021-04-12 NOTE — Progress Notes (Signed)
IV magnesium would really be best given the critical level.  Once I know how she has been taking synthroid - will adjust and will need to recheck TSH in 1 month.

## 2021-04-12 NOTE — Progress Notes (Signed)
New Patient Office Visit  Subjective:  Patient ID: Eileen Mack, female    DOB: Aug 10, 1935  Age: 85 y.o. MRN: [id number]  CC:  Chief Complaint  Patient presents with   New Patient (Initial Visit)    HPI Eileen Mack presents for establishment of care, she was unable to get into her PCP for 6 months she reports and needed a hospital follow up.  Acute  viral gastroenteritis on 04/03/21 was seen in ER and admitted.  Per ER report patient presented for acute nausea vomiting was evaluated by GI.  She was treated symptomatically and showed spontaneous improvement per ER report.  Chest x-ray did show vascular congestion so she was treated with IV Lasix and did get a good response per ED note.  She was found to have mild hypokalemia.  Of note patient request to go home because she wanted to take care of her dog.   Recommendations for Outpatient Follow-up:  Follow up with PCP within 1-2 weeks to monitor electrolytes, especially K+ as she was mildly low day prior to discharge. Also, monitor respiratory status as she had transient case of pulmonary congestion with IV fluids and was treated with lasix 40mg  IV x1.  Result Date: 04/07/2021 CLINICAL DATA:  Acute onset of shortness of breath. EXAM: NUCLEAR MEDICINE PERFUSION LUNG SCAN TECHNIQUE: Perfusion images were obtained in multiple projections after intravenous injection of radiopharmaceutical. Ventilation scans intentionally deferred if perfusion scan and chest x-ray adequate for interpretation during COVID 19 epidemic. RADIOPHARMACEUTICALS:  4.34 mCi Tc-73m MAA IV COMPARISON:  Chest x-ray 04/02/2020 and chest x-ray 04/07/2021 FINDINGS: Diffuse patchy perfusion abnormality in both lungs likely related to the chronic lung disease. There is a larger more discrete segmental perfusion defect in the right lower lobe anteriorly which is suspicious for pulmonary embolism. Based on strict criteria this is intermediate or indeterminate. A CTA  chest for pulmonary embolism may be helpful for further evaluation if the patient can have contrast. IMPRESSION: Intermediate/indeterminate perfusion study. A follow-up CTA chest is suggested if the patient can have IV contrast. Electronically Signed   By: 14/10/2020 M.D.   On: 04/07/2021 13:59  She did not have follow-up imaging, she is able to have contrast dye she has had to be premedicated prior she reports. She did not have follow up of the chest CTA with contrast, she did have positive D dimer so this needs to be done.   Vision loss right eye, she reports has been ongoing for the last 4 to 5 months she has seen her eye doctor at 14/10/2020.   She is a smoker.  She denies any significant shortness of breath.  She denies any active chest pain.  She feels well today. History of a fib in past. History of 2 cardiac ablations. She was seeing Dr. [employer] in past.  She wants to stay with [facility] cardiology. She will need sooner follow up and will call.  He is follow-up for atrial fibrillation and vascular congestion.  Patient  denies any fever, body aches,chills, rash, chest pain, shortness of breath, nausea, vomiting, or diarrhea.  Denies dizziness, lightheadedness, pre syncopal or syncopal episodes.     Past Medical History:  Diagnosis Date   AAA (abdominal aortic aneurysm)    Allergy    Anemia    Atrial fibrillation (HCC)    Blood transfusion without reported diagnosis    CAD (coronary artery disease)    s/p PCI to LAD & RCA   COPD (chronic obstructive pulmonary  disease) (HCC)    Depression    Emphysema of lung (HCC)    GERD (gastroesophageal reflux disease)    History of blood clots    Hyperlipidemia    Hypertension    Personal history of tobacco use, presenting hazards to health 07/01/2015   Renal disorder    Thyroid disease     Past Surgical History:  Procedure Laterality Date   ABDOMINAL HYSTERECTOMY     APPENDECTOMY     CARDIAC SURGERY     CHOLECYSTECTOMY      COLONOSCOPY WITH PROPOFOL Bilateral 08/01/2015   Procedure: COLONOSCOPY WITH PROPOFOL;  Surgeon: Scot Jun, MD;  Location: [facility] ENDOSCOPY;  Service: Endoscopy;  Laterality: Bilateral;   ENDOVASCULAR REPAIR/STENT GRAFT N/A 02/21/2018   Procedure: ENDOVASCULAR REPAIR/STENT GRAFT;  Surgeon: Renford Dills, MD;  Location: [facility] INVASIVE CV LAB;  Service: Cardiovascular;  Laterality: N/A;   ESOPHAGOGASTRODUODENOSCOPY N/A 07/31/2015   Procedure: ESOPHAGOGASTRODUODENOSCOPY (EGD);  Surgeon: Scot Jun, MD;  Location: [facility] ENDOSCOPY;  Service: Endoscopy;  Laterality: N/A;   EYE SURGERY     LEFT HEART CATH AND CORONARY ANGIOGRAPHY N/A 04/03/2020   Procedure: LEFT HEART CATH AND CORONARY ANGIOGRAPHY possible PCI and stent;  Surgeon: Alwyn Pea, MD;  Location: [facility] INVASIVE CV LAB;  Service: Cardiovascular;  Laterality: N/A;    Family History  Problem Relation Age of Onset   Heart attack Father 54   Brain cancer Sister    Lung cancer Sister    Breast cancer Neg Hx    Colon cancer Neg Hx     Social History   Socioeconomic History   Marital status: Widowed    Spouse name: Not on file   Number of children: Not on file   Years of education: Not on file   Highest education level: Not on file  Occupational History   Not on file  Tobacco Use   Smoking status: Every Day    Packs/day: 1.00    Years: 62.00    Pack years: 62.00    Types: Cigarettes   Smokeless tobacco: Never  Vaping Use   Vaping Use: Never used  Substance and Sexual Activity   Alcohol use: Not Currently   Drug use: Never   Sexual activity: Not Currently  Other Topics Concern   Not on file  Social History Narrative   Not on file   Social Determinants of Health   Financial Resource Strain: Not on file  Food Insecurity: Not on file  Transportation Needs: Not on file  Physical Activity: Not on file  Stress: Not on file  Social Connections: Not on file  Intimate Partner Violence: Not on file     ROS Review of Systems  Constitutional:  Positive for fatigue. Negative for activity change, appetite change, chills, diaphoresis, fever and unexpected weight change.  HENT: Negative.    Respiratory:  Positive for shortness of breath. Negative for apnea, cough, choking, chest tightness, wheezing and stridor.   Cardiovascular:  Negative for chest pain, palpitations and leg swelling.  Gastrointestinal:  Positive for diarrhea (Resolved since hospitalization.).  Genitourinary: Negative.   Musculoskeletal: Negative.   Skin: Negative.   Neurological: Negative.   Psychiatric/Behavioral: Negative.     Objective:   Today's Vitals: BP 132/80   Pulse (!) 111   Temp (!) 95.4 F (35.2 C)   Ht 5' 7.01" (1.702 m)   Wt 162 lb 6.4 oz (73.7 kg)   SpO2 98%   BMI 25.43 kg/m   Physical Exam Vitals reviewed.  Constitutional:      General: She is not in acute distress.    Appearance: She is well-developed and normal weight. She is not ill-appearing, toxic-appearing or diaphoretic.     Interventions: She is not intubated. HENT:     Head: Normocephalic and atraumatic.     Right Ear: Ear canal and external ear normal.     Left Ear: Ear canal and external ear normal.     Nose: Nose normal.     Mouth/Throat:     Mouth: Mucous membranes are moist.     Pharynx: No oropharyngeal exudate.  Eyes:     General: Lids are normal. No scleral icterus.       Right eye: No discharge.        Left eye: No discharge.     Extraocular Movements: Extraocular movements intact.     Conjunctiva/sclera: Conjunctivae normal.     Right eye: Right conjunctiva is not injected. No exudate or hemorrhage.    Left eye: Left conjunctiva is not injected. No exudate or hemorrhage.    Pupils: Pupils are equal, round, and reactive to light.  Neck:     Thyroid: No thyroid mass or thyromegaly.     Vascular: Normal carotid pulses. No carotid bruit, hepatojugular reflux or JVD.     Trachea: Trachea and phonation normal. No  tracheal tenderness or tracheal deviation.     Meningeal: Brudzinski's sign and Kernig's sign absent.  Cardiovascular:     Rate and Rhythm: Regular rhythm. Tachycardia present.     Pulses: Normal pulses.          Radial pulses are 2+ on the right side and 2+ on the left side.       Dorsalis pedis pulses are 2+ on the right side and 2+ on the left side.       Posterior tibial pulses are 2+ on the right side and 2+ on the left side.     Heart sounds: Normal heart sounds, S1 normal and S2 normal. Heart sounds not distant. No murmur heard.   No friction rub. No gallop.  Pulmonary:     Effort: Pulmonary effort is normal. No tachypnea, bradypnea, accessory muscle usage or respiratory distress. She is not intubated.     Breath sounds: Normal breath sounds. No stridor. No wheezing, rhonchi or rales.     Comments: Mildly decreased breath sounds right lower lobe. Chest:     Chest wall: No tenderness.  Abdominal:     General: Bowel sounds are normal. There is no distension or abdominal bruit.     Palpations: Abdomen is soft. There is no shifting dullness, fluid wave, hepatomegaly, splenomegaly, mass or pulsatile mass.     Tenderness: There is no abdominal tenderness. There is no right CVA tenderness, left CVA tenderness, guarding or rebound.     Hernia: No hernia is present.  Musculoskeletal:        General: No tenderness or deformity. Normal range of motion.     Cervical back: Full passive range of motion without pain, normal range of motion and neck supple. No edema, erythema, rigidity or tenderness. No spinous process tenderness or muscular tenderness. Normal range of motion.     Right lower leg: No edema.     Left lower leg: No edema.  Lymphadenopathy:     Head:     Right side of head: No submental, submandibular, tonsillar, preauricular, posterior auricular or occipital adenopathy.     Left side of head: No submental, submandibular, tonsillar,  preauricular, posterior auricular or occipital  adenopathy.     Cervical: No cervical adenopathy.     Right cervical: No superficial, deep or posterior cervical adenopathy.    Left cervical: No superficial, deep or posterior cervical adenopathy.     Upper Body:     Right upper body: No supraclavicular or pectoral adenopathy.     Left upper body: No supraclavicular or pectoral adenopathy.  Skin:    General: Skin is warm and dry.     Coloration: Skin is not jaundiced or pale.     Findings: No abrasion, bruising, burn, ecchymosis, erythema, lesion, petechiae or rash.     Nails: There is no clubbing.  Neurological:     Mental Status: She is alert and oriented to person, place, and time.     GCS: GCS eye subscore is 4. GCS verbal subscore is 5. GCS motor subscore is 6.     Cranial Nerves: No cranial nerve deficit.     Sensory: No sensory deficit.     Motor: No weakness, tremor, atrophy, abnormal muscle tone or seizure activity.     Coordination: Coordination normal.     Gait: Gait normal.     Deep Tendon Reflexes: Reflexes are normal and symmetric. Reflexes normal. Babinski sign absent on the right side. Babinski sign absent on the left side.     Reflex Scores:      Tricep reflexes are 2+ on the right side and 2+ on the left side.      Bicep reflexes are 2+ on the right side and 2+ on the left side.      Brachioradialis reflexes are 2+ on the right side and 2+ on the left side.      Patellar reflexes are 2+ on the right side and 2+ on the left side.      Achilles reflexes are 2+ on the right side and 2+ on the left side. Psychiatric:        Mood and Affect: Mood normal.        Speech: Speech normal.        Behavior: Behavior normal.        Thought Content: Thought content normal.        Judgment: Judgment normal.    Assessment & Plan:   Problem List Items Addressed This Visit       Respiratory   COPD (chronic obstructive pulmonary disease) (HCC) - Primary   Relevant Medications   predniSONE (DELTASONE) 10 MG tablet   Other  Relevant Orders   Ambulatory referral to Pulmonology   CT Angio Chest W/Cm &/Or Wo Cm     Other   Hyperlipidemia   Relevant Orders   Ambulatory referral to Cardiology   Other Visit Diagnoses     Positive D dimer       Relevant Orders   D-Dimer, Quantitative   Ambulatory referral to Pulmonology   CT Angio Chest W/Cm &/Or Wo Cm   History of atrial fibrillation       Relevant Orders   Magnesium   CBC with Differential/Platelet   Comprehensive metabolic panel   TSH   B Nat Peptide   Ambulatory referral to Cardiology   CT Angio Chest W/Cm &/Or Wo Cm   Abnormal CT scan of lung       Relevant Orders   Ambulatory referral to Pulmonology   CT Angio Chest W/Cm &/Or Wo Cm   Vision loss       Relevant Orders   Ambulatory  referral to Ophthalmology   Chest pain, unspecified type       Relevant Orders   CT Angio Chest W/Cm &/Or Wo Cm   Allergy to imaging contrast media       Relevant Medications   predniSONE (DELTASONE) 10 MG tablet       Outpatient Encounter Medications as of 04/12/2021  Medication Sig   acetaminophen (TYLENOL) 650 MG CR tablet Take 1,300 mg by mouth every 8 (eight) hours.   albuterol (VENTOLIN HFA) 108 (90 Base) MCG/ACT inhaler Inhale 2 puffs into the lungs every 6 (six) hours as needed for wheezing or shortness of breath.   aspirin EC 81 MG tablet Take 81 mg by mouth daily.   atorvastatin (LIPITOR) 40 MG tablet Take 1 tablet (40 mg total) by mouth daily.   buPROPion (WELLBUTRIN XL) 150 MG 24 hr tablet Take 1 tablet (150 mg total) by mouth daily.   carvedilol (COREG) 25 MG tablet Take 1 tablet (25 mg total) by mouth 2 (two) times daily with a meal.   famotidine (PEPCID) 20 MG tablet Take 1 tablet (20 mg total) by mouth 2 (two) times daily.   ferrous sulfate 325 (65 FE) MG tablet Take 1 tablet (325 mg total) by mouth daily with breakfast.   fluticasone (FLONASE) 50 MCG/ACT nasal spray Place into the nose.   fluticasone furoate-vilanterol (BREO ELLIPTA) 200-25  MCG/ACT AEPB INHALE 1 PUFF IN THE MORNING   gabapentin (NEURONTIN) 600 MG tablet Take 1 tablet (600 mg total) by mouth 3 (three) times daily.   levothyroxine (EUTHYROX) 112 MCG tablet Take 1 tablet (112 mcg total) by mouth daily. OK for change in manufacturer   lidocaine (LIDODERM) 5 % Place 1 patch onto the skin every 12 (twelve) hours. Remove & Discard patch within 12 hours or as directed by MD   loperamide (IMODIUM A-D) 2 MG tablet Take 2 tablets (4 mg total) by mouth 4 (four) times daily as needed for diarrhea or loose stools.   Multiple Vitamins-Minerals (MULTIVITAMIN GUMMIES ADULT PO) Take 1 each by mouth daily.   nitroGLYCERIN (NITROSTAT) 0.4 MG SL tablet Place 1 tablet (0.4 mg total) under the tongue every 5 (five) minutes as needed for chest pain.   ondansetron (ZOFRAN-ODT) 4 MG disintegrating tablet Take 1 tablet (4 mg total) by mouth every 8 (eight) hours as needed for nausea or vomiting.   predniSONE (DELTASONE) 10 MG tablet Take 50 mg ( 5 tablets) by mouth 13 hours, 7 hours and 1 hour before contrast dye   Venlafaxine HCl 225 MG TB24 Take 1 tablet (225 mg total) by mouth daily.   No facility-administered encounter medications on file as of 04/12/2021.  Have significant concern she did have a positive D-dimer hospital and needs follow-up imaging will be premedicated prednisone for her mild allergy to proceeding and has been premedicated in past with success.  Referral to cardiology as well as pulmonary for chronic health maintenance.  He has also been referred to [facility] urgently for evaluation of vision loss.  Is been going on for the last 3 to 5 months.  She should not be driving until evaluated by ophthalmology.  Labs today. Red Flags discussed. The patient was given clear instructions to go to ER or return to medical center if any red flags develop, symptoms do not improve, worsen or new problems develop. They verbalized understanding.   Follow-up: Return in about 3  weeks (around 05/03/2021), or if symptoms worsen or fail to improve, for at  any time for any worsening symptoms, Go to Emergency room/ urgent care if worse.   Jairo Ben, FNP

## 2021-04-12 NOTE — Telephone Encounter (Signed)
Pt returning call. Pt requesting callback.  °

## 2021-04-12 NOTE — Discharge Instructions (Addendum)
Your magnesium was very low.  We have given you some IV magnesium here in the hospital but you will need more at home.  I want you to try to take 1 magnesium pill 3 times a day.  If that is tolerated by your system try to increase it to 2 magnesium pills 3 times a day.  After about a week go ahead back to your doctor and have your magnesium level and your potassium level checked.  Try to eat a good amount of oranges or bananas or other high potassium foods.  Please return for any problems.  Be careful sometimes magnesium will give you an upset stomach and diarrhea.  If that happens stop the magnesium and get your doctor to check on you.  Please follow-up with your doctor or return here if you have any problems with shortness of breath or chest pain or weakness or anything else.  F/u heart failure clinic for pulmonary edema. Holding on lasix due to low mag   See incidental finding and talk to PCP for f.u  1. No CT evidence of pulmonary embolism.  2. Pulmonary edema.  3. Scattered pulmonary nodules measuring up to 9 mm in the right  lower lobe. Non-contrast chest CT at 3-6 months is recommended. If  the nodules are stable at time of repeat CT, then future CT at 18-24  months (from today's scan) is considered optional for low-risk  patients, but is recommended for high-risk patients. This  recommendation follows the consensus statement: Guidelines for  Management of Incidental Pulmonary Nodules Detected on CT Images:  From the Fleischner Society 2017; Radiology 2017; 284:228-243.  4. Aortic Atherosclerosis (ICD10-I70.0).

## 2021-04-12 NOTE — Patient Instructions (Signed)
Shortness of Breath, Adult °Shortness of breath means you have trouble breathing. Shortness of breath could be a sign of a medical problem. °Follow these instructions at home: °Pollution °Do not smoke or use any products that contain nicotine or tobacco. If you need help quitting, ask your doctor. °Avoid things that can make it harder to breathe, such as: °Smoke of all kinds. This includes smoke from campfires or forest fires. Do not smoke or allow others to smoke in your home. °Mold. °Dust. °Air pollution. °Chemical smells. °Things that can give you an allergic reaction (allergens) if you have allergies. °Keep your living space clean. Use products that help remove mold and dust. °General instructions °Watch for any changes in your symptoms. °Take over-the-counter and prescription medicines only as told by your doctor. This includes oxygen therapy and inhaled medicines. °Rest as needed. °Return to your normal activities when your doctor says that it is safe. °Keep all follow-up visits. °Contact a doctor if: °Your condition does not get better as soon as expected. °You have a hard time doing your normal activities, even after you rest. °You have new symptoms. °You cannot walk up stairs. °You cannot exercise the way you normally do. °Get help right away if: °Your shortness of breath gets worse. °You have trouble breathing when you are resting. °You feel light-headed or you faint. °You have a cough that is not helped by medicines. °You cough up blood. °You have pain with breathing. °You have pain in your chest, arms, shoulders, or belly (abdomen). °You have a fever. °These symptoms may be an emergency. Get help right away. Call 911. °Do not wait to see if the symptoms will go away. °Do not drive yourself to the hospital. °Summary °Shortness of breath is when you have trouble breathing enough air. It can be a sign of a medical problem. °Avoid things that make it hard for you to breathe, such as smoking, pollution, mold,  and dust. °Watch for any changes in your symptoms. Contact your doctor if you do not get better or you get worse. °This information is not intended to replace advice given to you by your health care provider. Make sure you discuss any questions you have with your health care provider. °Document Revised: 12/05/2020 Document Reviewed: 12/05/2020 °Elsevier Patient Education © 2022 Elsevier Inc. ° °

## 2021-04-12 NOTE — Telephone Encounter (Signed)
[name] from [facility] just added needs order for Child psychotherapist and OT evaluation for adl training, also requesting 3 in 1 commode and 2 wheel walker

## 2021-04-12 NOTE — Progress Notes (Signed)
Orders Placed This Encounter  Procedures   CT Angio Chest W/Cm &/Or Wo Cm    Allergy to cotrast needs premedication    Order Specific Question:   If indicated for the ordered procedure, I authorize the administration of contrast media per Radiology protocol    Answer:   Yes    Order Specific Question:   Preferred imaging location?    Answer:   [facility]   Magnesium   CBC with Differential/Platelet   Comprehensive metabolic panel   D-Dimer, Quantitative   TSH   B Nat Peptide   Ambulatory referral to Cardiology    Referral Priority:   Urgent    Referral Type:   Consultation    Referral Reason:   Specialty Services Required    Requested Specialty:   Cardiology    Number of Visits Requested:   1   Ambulatory referral to Ophthalmology    Referral Priority:   Urgent    Referral Type:   Consultation    Referral Reason:   Specialty Services Required    Requested Specialty:   Ophthalmology    Number of Visits Requested:   1   Ambulatory referral to Pulmonology    Referral Priority:   Urgent    Referral Type:   Consultation    Referral Reason:   Specialty Services Required    Referred to Provider:   Salena Saner, MD    Requested Specialty:   Pulmonary Disease    Number of Visits Requested:   1   AMB Referral to [facility] Coordinaton    Referral Priority:   Routine    Referral Type:   Consultation    Referral Reason:   Care Coordination    Number of Visits Requested:   1

## 2021-04-12 NOTE — Progress Notes (Signed)
Noted, we can try to reach her again before we leave. Thank you

## 2021-04-12 NOTE — Progress Notes (Signed)
D dimer is still positive. CT to rule out PE was ordered at office visit not done in the hospital.  Magnesium is critical low, would advise her to return to the emergency room for magnesium IV at this point.  CBC shows anemia, any bleeding ?  Please add on iron- tibc - ferritin to lab if able today.  TSH is very hypothyroid- please verify if she is taking her Synthroid every day and if so the dosage ? Is she taking it alone or with dairy or other vitamins ?  GFR kidney function slightly decreased, creatine and BUN ok.

## 2021-04-12 NOTE — Progress Notes (Signed)
Patient says she takes her Levothyroxine right before coffee and eating advised patient she would wait 30 minutes to an hour before food and coffee after taking magnesium. patient agreed to go to ER.  Called Er to notify patient coming.

## 2021-04-12 NOTE — Addendum Note (Signed)
Addended by: Berniece Pap on: 04/12/2021 05:12 PM   Modules accepted: Orders

## 2021-04-12 NOTE — Telephone Encounter (Signed)
CRITICAL VALUE STICKER  CRITICAL VALUE: Magnesium 0.87 (L)  RECEIVER (on-site recipient of call): Silvestre Moment, CMA  DATE & TIME NOTIFIED: 2:30pm 04/12/21   MESSENGER (representative from lab): Hope @ [facility]  MD NOTIFIED: Flinchum, NP  TIME OF NOTIFICATION: 2:33pm  RESPONSE:

## 2021-04-12 NOTE — ED Provider Notes (Addendum)
[facility] Emergency Department Provider Note     Event Date/Time   First MD Initiated Contact with Patient 04/12/21 2101     (approximate)  I have reviewed the triage vital signs and the nursing notes.   HISTORY  Chief Complaint Abnormal Lab   HPI Eileen Mack is a 85 y.o. female patient sent from office for magnesium level of 0.9.  Patient reports she has problems maintaining her potassium and magnesium normally.  She feels well.  She is not having any weakness or any other symptoms at all.             Past Medical History:  Diagnosis Date   AAA (abdominal aortic aneurysm)    Allergy    Anemia    Atrial fibrillation (HCC)    Blood transfusion without reported diagnosis    CAD (coronary artery disease)    s/p PCI to LAD & RCA   COPD (chronic obstructive pulmonary disease) (HCC)    Depression    Emphysema of lung (HCC)    GERD (gastroesophageal reflux disease)    History of blood clots    Hyperlipidemia    Hypertension    Personal history of tobacco use, presenting hazards to health 07/01/2015   Renal disorder    Thyroid disease     Patient Active Problem List   Diagnosis Date Noted   Positive D dimer 04/12/2021   History of atrial fibrillation 04/12/2021   Vision loss 04/12/2021   Chest pain 04/12/2021   Allergy to imaging contrast media 04/12/2021   Acute gastroenteritis 04/04/2021   COPD (chronic obstructive pulmonary disease) (HCC) 04/04/2021   Depression 04/04/2021   Acute renal failure superimposed on stage 3a chronic kidney disease (HCC) 04/04/2021   SIRS (systemic inflammatory response syndrome) (HCC) 04/04/2021   Hypokalemia 04/04/2021   Gastroenteritis 04/04/2021   Grief at loss of child 04/01/2021   Loss of appetite 04/01/2021   Isolation (social) 04/01/2021   Lack of concentration 04/01/2021   Hyperlipidemia 04/01/2021   Need for influenza vaccination 04/01/2021   Lung nodules 05/04/2020   Abnormal CT scan  of lung 05/04/2020   Hypertension 05/04/2020   ACS (acute coronary syndrome) (HCC)    Hallucinations    Acute respiratory failure (HCC) 04/02/2020   OA (osteoarthritis) 09/19/2019   History of recurrent ear infection 09/19/2019   Prediabetes 07/31/2019   Chronic right shoulder pain 02/26/2019   Senile purpura (HCC) 02/26/2019   Breast pain, left 02/26/2019   Renal mass 02/26/2019   COPD with acute exacerbation (HCC) 01/06/2019   AAA (abdominal aortic aneurysm) without rupture 10/10/2017   Varicose veins of both lower extremities with pain 10/10/2017   Hyperlipemia, mixed 08/14/2017   Neuropathy 08/14/2017   Depression, prolonged 08/14/2017   Cervical disc disease 08/14/2017   Diarrhea 08/14/2017   Tobacco abuse 08/14/2017   Multiple pulmonary nodules 04/29/2016   Iron deficiency anemia due to chronic blood loss 07/29/2015   Tobacco dependence 07/01/2015   PUD (peptic ulcer disease) 06/18/2014   Bigeminy 09/03/2013   Onychomycosis 02/25/2013   Hallux valgus with bunions 01/14/2013   Hammertoe 01/14/2013   DDD (degenerative disc disease), lumbar 08/09/2012   Atherosclerosis of native coronary artery of native heart with stable angina pectoris (HCC) 08/03/2012   Hypothyroidism 07/02/2012   GERD (gastroesophageal reflux disease) 07/02/2012   Cervical spondylosis without myelopathy 07/21/2011   Sciatica 07/21/2011   Accelerated hypertension 11/26/2010   Atrial fibrillation with RVR (HCC) 11/26/2010    Past Surgical  History:  Procedure Laterality Date   ABDOMINAL HYSTERECTOMY     APPENDECTOMY     CARDIAC SURGERY     CHOLECYSTECTOMY     COLONOSCOPY WITH PROPOFOL Bilateral 08/01/2015   Procedure: COLONOSCOPY WITH PROPOFOL;  Surgeon: Scot Jun, MD;  Location: [facility] ENDOSCOPY;  Service: Endoscopy;  Laterality: Bilateral;   ENDOVASCULAR REPAIR/STENT GRAFT N/A 02/21/2018   Procedure: ENDOVASCULAR REPAIR/STENT GRAFT;  Surgeon: Renford Dills, MD;  Location: [facility] INVASIVE CV  LAB;  Service: Cardiovascular;  Laterality: N/A;   ESOPHAGOGASTRODUODENOSCOPY N/A 07/31/2015   Procedure: ESOPHAGOGASTRODUODENOSCOPY (EGD);  Surgeon: Scot Jun, MD;  Location: [facility] ENDOSCOPY;  Service: Endoscopy;  Laterality: N/A;   EYE SURGERY     LEFT HEART CATH AND CORONARY ANGIOGRAPHY N/A 04/03/2020   Procedure: LEFT HEART CATH AND CORONARY ANGIOGRAPHY possible PCI and stent;  Surgeon: Alwyn Pea, MD;  Location: [facility] INVASIVE CV LAB;  Service: Cardiovascular;  Laterality: N/A;    Prior to Admission medications   Medication Sig Start Date End Date Taking? Authorizing Provider  acetaminophen (TYLENOL) 650 MG CR tablet Take 1,300 mg by mouth every 8 (eight) hours.    [provider]  albuterol (VENTOLIN HFA) 108 (90 Base) MCG/ACT inhaler Inhale 2 puffs into the lungs every 6 (six) hours as needed for wheezing or shortness of breath. 05/04/20   Erasmo Downer, MD  aspirin EC 81 MG tablet Take 81 mg by mouth daily.    [provider]  atorvastatin (LIPITOR) 40 MG tablet Take 1 tablet (40 mg total) by mouth daily. 04/01/21   Jacky Kindle, FNP  buPROPion (WELLBUTRIN XL) 150 MG 24 hr tablet Take 1 tablet (150 mg total) by mouth daily. 04/01/21   Jacky Kindle, FNP  carvedilol (COREG) 25 MG tablet Take 1 tablet (25 mg total) by mouth 2 (two) times daily with a meal. 04/01/21   Jacky Kindle, FNP  famotidine (PEPCID) 20 MG tablet Take 1 tablet (20 mg total) by mouth 2 (two) times daily. 04/04/21   Sharman Cheek, MD  ferrous sulfate 325 (65 FE) MG tablet Take 1 tablet (325 mg total) by mouth daily with breakfast. 08/01/15   Adrian Saran, MD  fluticasone (FLONASE) 50 MCG/ACT nasal spray Place into the nose. 08/15/19   [provider]  fluticasone furoate-vilanterol (BREO ELLIPTA) 200-25 MCG/ACT AEPB INHALE 1 PUFF IN THE MORNING 03/19/21   Bacigalupo, Marzella Schlein, MD  gabapentin (NEURONTIN) 600 MG tablet Take 1 tablet (600 mg total) by mouth 3 (three) times daily.  04/01/21   Jacky Kindle, FNP  levothyroxine (EUTHYROX) 112 MCG tablet Take 1 tablet (112 mcg total) by mouth daily. OK for change in manufacturer 04/01/21   Jacky Kindle, FNP  lidocaine (LIDODERM) 5 % Place 1 patch onto the skin every 12 (twelve) hours. Remove & Discard patch within 12 hours or as directed by MD 02/09/21 02/09/22  Delton Prairie, MD  loperamide (IMODIUM A-D) 2 MG tablet Take 2 tablets (4 mg total) by mouth 4 (four) times daily as needed for diarrhea or loose stools. 04/04/21   Sharman Cheek, MD  Multiple Vitamins-Minerals (MULTIVITAMIN GUMMIES ADULT PO) Take 1 each by mouth daily.    [provider]  nitroGLYCERIN (NITROSTAT) 0.4 MG SL tablet Place 1 tablet (0.4 mg total) under the tongue every 5 (five) minutes as needed for chest pain. 09/24/20   Erasmo Downer, MD  ondansetron (ZOFRAN-ODT) 4 MG disintegrating tablet Take 1 tablet (4 mg total) by mouth every  8 (eight) hours as needed for nausea or vomiting. 04/04/21   Sharman Cheek, MD  predniSONE (DELTASONE) 10 MG tablet Take 50 mg ( 5 tablets) by mouth 13 hours, 7 hours and 1 hour before contrast dye 04/12/21   Flinchum, Eula Fried, FNP  Venlafaxine HCl 225 MG TB24 Take 1 tablet (225 mg total) by mouth daily. 04/01/21   Jacky Kindle, FNP    Allergies Iodinated diagnostic agents and Sulfa antibiotics  Family History  Problem Relation Age of Onset   Heart attack Father 19   Brain cancer Sister    Lung cancer Sister    Breast cancer Neg Hx    Colon cancer Neg Hx     Social History Social History   Tobacco Use   Smoking status: Every Day    Packs/day: 1.00    Years: 62.00    Pack years: 62.00    Types: Cigarettes   Smokeless tobacco: Never  Vaping Use   Vaping Use: Never used  Substance Use Topics   Alcohol use: Not Currently   Drug use: Never    Review of Systems  Constitutional: No fever/chills Eyes: No visual changes. ENT: No sore throat. Cardiovascular: Denies chest  pain. Respiratory: Denies shortness of breath. Gastrointestinal: No abdominal pain.  No nausea, no vomiting.  No diarrhea.  No constipation. Genitourinary: Negative for dysuria. Musculoskeletal: Negative for back pain. Skin: Negative for rash. Neurological: Negative for headaches, focal weakness    ____________________________________________   PHYSICAL EXAM:  VITAL SIGNS: ED Triage Vitals [04/12/21 1841]  Enc Vitals Group     BP      Pulse      Resp      Temp      Temp src      SpO2      Weight 162 lb 4.1 oz (73.6 kg)     Height 5\' 7"  (1.702 m)     Head Circumference      Peak Flow      Pain Score 0     Pain Loc      Pain Edu?      Excl. in GC?     Constitutional: Alert and oriented. Well appearing and in no acute distress. Eyes: Conjunctivae are normal.  Head: Atraumatic. Nose: No congestion/rhinnorhea. Mouth/Throat: Mucous membranes are moist.  Oropharynx non-erythematous. Neck: No stridor.  Cardiovascular: Normal rate, regular rhythm. Grossly normal heart sounds.  Good peripheral circulation. Respiratory: Normal respiratory effort.  No retractions. Lungs CTAB. Gastrointestinal: Soft and nontender. No distention. No abdominal bruits.  Musculoskeletal: No lower extremity tenderness nor edema.   Neurologic:  Normal speech and language. No gross focal neurologic deficits are appreciated.  1+ DTRs in both knees no DTRs in the ankles.  Patient reports she has chronic numbness in both feet. Skin:  Skin is warm, dry and intact. No rash noted.  ____________________________________________   LABS (all labs ordered are listed, but only abnormal results are displayed)  Labs Reviewed  BASIC METABOLIC PANEL  CBC  MAGNESIUM  TROPONIN I (HIGH SENSITIVITY)   ____________________________________________  EKG  EKG read interpreted by me shows normal sinus rhythm at 76 left bundle branch block EKG is similar to 1 from 04 April 2021 Further review of EKGs demonstrate  that she had an episode of what possibly could be V. tach earlier on 04 April 2021.  This may explain why her troponin went up.  Her BNP is elevated.  They had apparently over hydrated her in the hospital on  her admission on December 3.  Not sure if the BNP would still be elevated now.  I doubt it. ____________________________________________  RADIOLOGY Jill Poling, personally viewed and evaluated these images (plain radiographs) as part of my medical decision making, as well as reviewing the written report by the radiologist.  ED MD interpretation:    Official radiology report(s): No results found.  ____________________________________________   PROCEDURES  Procedure(s) performed (including Critical Care):  Procedures   ____________________________________________   INITIAL IMPRESSION / ASSESSMENT AND PLAN / ED COURSE ----------------------------------------- 9:59 PM on 04/12/2021 ----------------------------------------- Patient was initially marked as being able to come back to fast track because she was reportedly only needing magnesium replenishment.  However it develops that she also in outpatient facility had a high D-dimer and was ordered to have a CT angio this morning which was not done because she needs premedication.  She also had a high BNP and history of a recent high troponin.  She had an episode of what could be tachycardia due to A. fib with a left bundle branch or possibly, although I do not think so, could be V. tach at a rate of 148 on the fourth of this month.  She gets hives with IV dye.  Please note this is not an iodine allergy this is an allergy to IV contrast.  This was to be for evaluation of the positive D-dimer.  Patient herself denies any current chest pain or shortness of breath or other problems.  CT angio should read see any vascular congestion in the lungs as well.  Troponin has been ordered magnesium and lites etc. will be done  shortly. ----------------------------------------- 10:39 PM on 04/12/2021 ----------------------------------------- Patient will have to be moved to the a side is the D side will be closing and 20 minutes.  Patient will not be anywhere near complete her evaluation and treatment by that time.  I have signed her out to both of the a side physicians at this point.  Room placement will depend on what is available and where the charge nurse wants to place her.  Charge nurse is also aware of this patient.    ----------------------------------------- 11:09 PM on 04/12/2021 ----------------------------------------- Magnesium level now has come back within normal limits.  This is to be expected given the slow uptake of magnesium per up-to-date.  Up-to-date suggest repleting magnesium IV with 8 or more grams over 12 to 24 hours.  I will give an additional 2 g IV over 3 to 4 hours.  If the patient is stable we will try to discharge her with p.o. magnesium. Up-to-date recommends a dose similar to Slow-Mag 6 to 8 tablets a day in divided doses.  I will start out with a lower dose and increase if she can tolerate it.  Diarrhea is a bad side effect and of course this could decrease her potassium and magnesium again.  CTangio etc. will be followed up by the A side doctors. ----------------------------------------- 11:26 PM on 04/12/2021 ----------------------------------------- Spelling corrections due to Dragon have just been finished and corrected      ____________________________________________   FINAL CLINICAL IMPRESSION(S) / ED DIAGNOSES  Final diagnoses:  Hypomagnesemia  Elevated d-dimer  Elevated brain natriuretic peptide (BNP) level  Elevated troponin     ED Discharge Orders     None        Note:  This document was prepared using Dragon voice recognition software and may include unintentional dictation errors.    Arnaldo Natal, MD  04/12/21 2310    Arnaldo Natal,  MD 04/12/21 2325    Arnaldo Natal, MD 04/12/21 [phone]

## 2021-04-12 NOTE — Progress Notes (Signed)
D dimer is still elevated, recommend she pre medicate as ordered and have CT angio as ordered. Refferal to pulmonary, cardiology and opthalmology was sent.  [facility] clinic [place], [place] Address: [address], [place], [place] [address] Open  Closes 5PM Phone: [phone] ER if worsening at anytime.

## 2021-04-13 ENCOUNTER — Telehealth: Payer: Medicare HMO

## 2021-04-13 ENCOUNTER — Other Ambulatory Visit: Payer: Self-pay | Admitting: Adult Health

## 2021-04-13 ENCOUNTER — Emergency Department: Payer: Medicare HMO

## 2021-04-13 ENCOUNTER — Encounter: Payer: Self-pay | Admitting: Radiology

## 2021-04-13 DIAGNOSIS — I509 Heart failure, unspecified: Secondary | ICD-10-CM

## 2021-04-13 LAB — TROPONIN I (HIGH SENSITIVITY): Troponin I (High Sensitivity): 25 ng/L — ABNORMAL HIGH (ref <18)

## 2021-04-13 MED ORDER — DIPHENHYDRAMINE HCL 50 MG/ML IJ SOLN
25.0000 mg | Freq: Once | INTRAMUSCULAR | Status: AC
  Administered 2021-04-13: 25 mg via INTRAVENOUS
  Filled 2021-04-13: qty 1

## 2021-04-13 MED ORDER — IOHEXOL 350 MG/ML SOLN
75.0000 mL | Freq: Once | INTRAVENOUS | Status: AC | PRN
  Administered 2021-04-13: 75 mL via INTRAVENOUS

## 2021-04-13 NOTE — ED Notes (Signed)
Patient ambulatory in hallway with steady gait on room air. Oxygen saturation 92-100% during ambulation. Denied shortness of breath. Heart rate up to 120s with exertion but back into 90's at rest in bed.

## 2021-04-13 NOTE — Telephone Encounter (Signed)
Patient was sent to hospital for IV magnesium. Noted and thank you.

## 2021-04-13 NOTE — Progress Notes (Deleted)
°   Patient ID: Eileen Mack, female    DOB: 1935/09/25, 85 y.o.   MRN: [id number]  HPI  Ms Eileen Mack is a 85 y/o female with a history of  Echo report from 04/02/20 reviewed and showed an EF of 60-65%  LHC done 04/03/20 showed: Prox RCA to Mid RCA lesion is 25% stenosed. Prox LAD lesion is 50% stenosed. Prox LAD to Mid LAD lesion is 10% stenosed. Widely patent mid RCA stent Widely patent mid LAD stent Preserved left ventricular function EF of 55% 2nd Diag lesion is 75% stenosed. TIMI-3 flow in all vessels Conclusion Diagnostic cardiac catheter right radial approach Preserved left ventricular function of 55% Large normal left main large LAD with a 50% proximal lesion widely patent mid LAD stent 75% ostial diagonal 1 Circumflex is large and free of disease RCA was large and had a 50% mid stent widely patent right dominant system  Was in the ED 04/12/21 due to low magnesium levels. CT negative for PE. IV magnesium given and she was released. Admitted 04/03/21 due to nausea/ vomiting. GI consult obtained. Given IV fluids along with placing on bipap due to shortness of breath. Given IV lasix and symptoms improved and quickly weaned off bipap/oxygen. K+ supplementation given. Discharged after 4 days.   She presents today for her initial visit with a chief complaint of   Review of Systems    Physical Exam    Assessment & Plan:  1: Chronic heart failure with preserved ejection fraction without structural changes- - NYHA class - BNP 04/07/21 was 725.9  2: HTN- - BP - saw PCP (Eileen Mack) 04/12/21 - BMP 04/12/21 reviewed and showed sodium 134, potassium 3.7, creatinine 0.96 & GFR 59  3: COPD-  4: Atrial fibrillation- - saw cardiology (Eileen Mack) 07/28/20  5: Tobacco use-

## 2021-04-13 NOTE — ED Provider Notes (Addendum)
CT scan shows pulm edema no PE.  Pt denies significant SOB. Pt needed IV lasix when in hospital but was not sent home due to low k and mg.  Given continued low Mag pt wants to hold on lasix given no significant symptoms.  Will do amb test and if okay will dc with f/u heart failure clinic for further eval/lab retest and maybe can start lasix if mag okay then or if develops symptoms.   Incidental findings on CT given to pt and refer to pulm nodule clinic   Ambulated without symptoms. Pt feels comfortable with Dc home. Slightly tachy with ambulation but okay with dc. Denies SOB.      Concha Se, MD 04/13/21 0600    Concha Se, MD 04/13/21 4599    Concha Se, MD 04/13/21 [phone]

## 2021-04-14 ENCOUNTER — Telehealth: Payer: Self-pay | Admitting: Adult Health

## 2021-04-14 ENCOUNTER — Other Ambulatory Visit
Admission: RE | Admit: 2021-04-14 | Discharge: 2021-04-14 | Disposition: A | Discharge status: Home / Self Care | Payer: Medicare HMO | Source: Ambulatory Visit | Attending: Family | Admitting: Family

## 2021-04-14 ENCOUNTER — Encounter: Payer: Self-pay | Admitting: Family

## 2021-04-14 ENCOUNTER — Ambulatory Visit: Payer: Medicare HMO | Attending: Family | Admitting: Family

## 2021-04-14 ENCOUNTER — Ambulatory Visit: Payer: Medicare HMO | Admitting: Family

## 2021-04-14 ENCOUNTER — Other Ambulatory Visit: Payer: Self-pay

## 2021-04-14 VITALS — BP 169/79 | HR 87 | Resp 18 | Ht 66.0 in | Wt 163.5 lb

## 2021-04-14 DIAGNOSIS — R790 Abnormal level of blood mineral: Secondary | ICD-10-CM

## 2021-04-14 DIAGNOSIS — I4891 Unspecified atrial fibrillation: Secondary | ICD-10-CM | POA: Diagnosis not present

## 2021-04-14 DIAGNOSIS — I714 Abdominal aortic aneurysm, without rupture, unspecified: Secondary | ICD-10-CM | POA: Insufficient documentation

## 2021-04-14 DIAGNOSIS — I130 Hypertensive heart and chronic kidney disease with heart failure and stage 1 through stage 4 chronic kidney disease, or unspecified chronic kidney disease: Secondary | ICD-10-CM | POA: Diagnosis not present

## 2021-04-14 DIAGNOSIS — K219 Gastro-esophageal reflux disease without esophagitis: Secondary | ICD-10-CM | POA: Diagnosis not present

## 2021-04-14 DIAGNOSIS — I5032 Chronic diastolic (congestive) heart failure: Secondary | ICD-10-CM | POA: Insufficient documentation

## 2021-04-14 DIAGNOSIS — I480 Paroxysmal atrial fibrillation: Secondary | ICD-10-CM

## 2021-04-14 DIAGNOSIS — F17210 Nicotine dependence, cigarettes, uncomplicated: Secondary | ICD-10-CM | POA: Insufficient documentation

## 2021-04-14 DIAGNOSIS — I10 Essential (primary) hypertension: Secondary | ICD-10-CM

## 2021-04-14 DIAGNOSIS — D649 Anemia, unspecified: Secondary | ICD-10-CM | POA: Insufficient documentation

## 2021-04-14 DIAGNOSIS — E785 Hyperlipidemia, unspecified: Secondary | ICD-10-CM | POA: Insufficient documentation

## 2021-04-14 DIAGNOSIS — J449 Chronic obstructive pulmonary disease, unspecified: Secondary | ICD-10-CM | POA: Diagnosis not present

## 2021-04-14 DIAGNOSIS — Z79899 Other long term (current) drug therapy: Secondary | ICD-10-CM | POA: Diagnosis not present

## 2021-04-14 DIAGNOSIS — N189 Chronic kidney disease, unspecified: Secondary | ICD-10-CM | POA: Diagnosis not present

## 2021-04-14 DIAGNOSIS — J45909 Unspecified asthma, uncomplicated: Secondary | ICD-10-CM | POA: Diagnosis not present

## 2021-04-14 DIAGNOSIS — E079 Disorder of thyroid, unspecified: Secondary | ICD-10-CM | POA: Insufficient documentation

## 2021-04-14 LAB — BASIC METABOLIC PANEL
Anion gap: 8 (ref 5–15)
BUN: 20 mg/dL (ref 8–23)
CO2: 27 mmol/L (ref 22–32)
Calcium: 9.2 mg/dL (ref 8.9–10.3)
Chloride: 101 mmol/L (ref 98–111)
Creatinine, Ser: 0.88 mg/dL (ref 0.44–1.00)
GFR, Estimated: >60 mL/min (ref >60)
Glucose, Bld: 95 mg/dL (ref 70–99)
Potassium: 4.2 mmol/L (ref 3.5–5.1)
Sodium: 136 mmol/L (ref 135–145)

## 2021-04-14 LAB — MAGNESIUM: Magnesium: 1.5 mg/dL — ABNORMAL LOW (ref 1.7–2.4)

## 2021-04-14 NOTE — Patient Instructions (Addendum)
Start weighing daily and call for an overnight weight gain of 3 pounds or more or a weekly weight gain of more than 5 pounds. ? ?

## 2021-04-14 NOTE — Telephone Encounter (Signed)
Byrd Hesselbach, PT, from [facility] called in regards to needing verbal orders for PT for pt. She is requesting  1 week 1  2 week 3  1 week 5  Byrd Hesselbach can be reached at [phone] The line is secured.

## 2021-04-14 NOTE — Progress Notes (Signed)
Patient ID: Eileen Mack, female    DOB: 06/29/1935, 85 y.o.   MRN: [id number]  HPI  Ms Dragotta is a 85 y/o female with a history of AAA, CAD, hyperlipidemia, HTN, CKD, thyroid disease, asthma, anemia, atrial fibrillation, COPD, depression, GERD, blood clots, current tobacco use and chronic heart failure.   Echo report from 04/02/20 reviewed and showed an EF of 60-65%  LHC done 04/03/20 showed: Prox RCA to Mid RCA lesion is 25% stenosed. Prox LAD lesion is 50% stenosed. Prox LAD to Mid LAD lesion is 10% stenosed. Widely patent mid RCA stent Widely patent mid LAD stent Preserved left ventricular function EF of 55% 2nd Diag lesion is 75% stenosed. TIMI-3 flow in all vessels Conclusion Diagnostic cardiac catheter right radial approach Preserved left ventricular function of 55% Large normal left main large LAD with a 50% proximal lesion widely patent mid LAD stent 75% ostial diagonal 1 Circumflex is large and free of disease RCA was large and had a 50% mid stent widely patent right dominant system  Was in the ED 04/12/21 due to low magnesium levels. CT negative for PE. IV magnesium given and she was released. Admitted 04/03/21 due to nausea/ vomiting. GI consult obtained. Given IV fluids along with placing on bipap due to shortness of breath. Given IV lasix and symptoms improved and quickly weaned off bipap/oxygen. K+ supplementation given. Discharged after 4 days.   She presents today for her initial visit with a chief complaint of minimal shortness of breath upon moderate exertion. She describes this as chronic in nature having been present for several years. She has associated fatigue, decreased appetite, loss of vision, depression and dizziness along with this. She denies any difficulty sleeping, abdominal distention, palpitations, pedal edema, chest pain, cough or weight gain.   She says that her last child died earlier this year and she is now all alone except a couple of  grandkids who work and have their own families. Reports complete vision loss in one eye and partial vision loss in the other eye making it difficult to read labels of recipes or medication bottles. Does continue to drive. Admits to feeling depressed but denies any suicidal thoughts.   Is in the process of switching her medications to [facility] pill packs to make it easier since she has such difficulty in reading the labels. Is adamant that magnesium wasn't called in for her and that she isn't taking it, although is unsure of exactly what all she is taking.   Past Medical History:  Diagnosis Date   AAA (abdominal aortic aneurysm)    Allergy    Anemia    Atrial fibrillation ([place])    Blood transfusion without reported diagnosis    CAD (coronary artery disease)    s/p PCI to LAD & RCA   CHF (congestive heart failure) (HCC)    COPD (chronic obstructive pulmonary disease) (HCC)    Depression    Emphysema of lung (HCC)    GERD (gastroesophageal reflux disease)    History of blood clots    Hyperlipidemia    Hypertension    Personal history of tobacco use, presenting hazards to health 07/01/2015   Renal disorder    Thyroid disease    Past Surgical History:  Procedure Laterality Date   ABDOMINAL HYSTERECTOMY     APPENDECTOMY     CARDIAC SURGERY     CHOLECYSTECTOMY     COLONOSCOPY WITH PROPOFOL Bilateral 08/01/2015   Procedure: COLONOSCOPY WITH PROPOFOL;  Surgeon: Gavin Pound  Vira Agar, MD;  Location: [facility];  Service: Endoscopy;  Laterality: Bilateral;   ENDOVASCULAR REPAIR/STENT GRAFT N/A 02/21/2018   Procedure: ENDOVASCULAR REPAIR/STENT GRAFT;  Surgeon: Katha Cabal, MD;  Location: [facility];  Service: Cardiovascular;  Laterality: N/A;   ESOPHAGOGASTRODUODENOSCOPY N/A 07/31/2015   Procedure: ESOPHAGOGASTRODUODENOSCOPY (EGD);  Surgeon: Manya Silvas, MD;  Location: [facility];  Service: Endoscopy;  Laterality: N/A;   EYE SURGERY     LEFT HEART CATH AND CORONARY  ANGIOGRAPHY N/A 04/03/2020   Procedure: LEFT HEART CATH AND CORONARY ANGIOGRAPHY possible PCI and stent;  Surgeon: Yolonda Kida, MD;  Location: [facility];  Service: Cardiovascular;  Laterality: N/A;   Family History  Problem Relation Age of Onset   Heart attack Father 71   Brain cancer Sister    Lung cancer Sister    Breast cancer Neg Hx    Colon cancer Neg Hx    Social History   Tobacco Use   Smoking status: Every Day    Packs/day: 1.00    Years: 62.00    Pack years: 62.00    Types: Cigarettes   Smokeless tobacco: Never  Substance Use Topics   Alcohol use: Not Currently   Allergies  Allergen Reactions   Iodinated Diagnostic Agents Hives, Itching and Rash    She states she has to be premedicated. SPM   Sulfa Antibiotics Rash   Prior to Admission medications   Medication Sig Start Date End Date Taking? Authorizing Provider  acetaminophen (TYLENOL) 650 MG CR tablet Take 1,300 mg by mouth every 8 (eight) hours.   Yes [provider]  albuterol (VENTOLIN HFA) 108 (90 Base) MCG/ACT inhaler Inhale 2 puffs into the lungs every 6 (six) hours as needed for wheezing or shortness of breath. 05/04/20  Yes Bacigalupo, Dionne Bucy, MD  aspirin EC 81 MG tablet Take 81 mg by mouth daily.   Yes [provider]  atorvastatin (LIPITOR) 40 MG tablet Take 1 tablet (40 mg total) by mouth daily. 04/01/21  Yes Tally Joe T, FNP  buPROPion (WELLBUTRIN XL) 150 MG 24 hr tablet Take 1 tablet (150 mg total) by mouth daily. 04/01/21  Yes Gwyneth Sprout, FNP  carvedilol (COREG) 25 MG tablet Take 1 tablet (25 mg total) by mouth 2 (two) times daily with a meal. 04/01/21  Yes Gwyneth Sprout, FNP  famotidine (PEPCID) 20 MG tablet Take 1 tablet (20 mg total) by mouth 2 (two) times daily. 04/04/21  Yes Carrie Mew, MD  ferrous sulfate 325 (65 FE) MG tablet Take 1 tablet (325 mg total) by mouth daily with breakfast. 08/01/15  Yes Mody, Sital, MD  fluticasone (FLONASE) 50 MCG/ACT nasal  spray Place into the nose. 08/15/19  Yes [provider]  fluticasone furoate-vilanterol (BREO ELLIPTA) 200-25 MCG/ACT AEPB INHALE 1 PUFF IN THE MORNING 03/19/21  Yes Bacigalupo, Dionne Bucy, MD  gabapentin (NEURONTIN) 600 MG tablet Take 1 tablet (600 mg total) by mouth 3 (three) times daily. 04/01/21  Yes Gwyneth Sprout, FNP  levothyroxine (EUTHYROX) 112 MCG tablet Take 1 tablet (112 mcg total) by mouth daily. OK for change in manufacturer 04/01/21  Yes Tally Joe T, FNP  loperamide (IMODIUM A-D) 2 MG tablet Take 2 tablets (4 mg total) by mouth 4 (four) times daily as needed for diarrhea or loose stools. 04/04/21  Yes Carrie Mew, MD  Multiple Vitamins-Minerals (MULTIVITAMIN GUMMIES ADULT PO) Take 1 each by mouth daily.   Yes [provider]  nitroGLYCERIN (NITROSTAT)  0.4 MG SL tablet Place 1 tablet (0.4 mg total) under the tongue every 5 (five) minutes as needed for chest pain. 09/24/20  Yes Bacigalupo, Marzella Schlein, MD  ondansetron (ZOFRAN-ODT) 4 MG disintegrating tablet Take 1 tablet (4 mg total) by mouth every 8 (eight) hours as needed for nausea or vomiting. 04/04/21  Yes Sharman Cheek, MD  Venlafaxine HCl 225 MG TB24 Take 1 tablet (225 mg total) by mouth daily. 04/01/21  Yes Jacky Kindle, FNP  lidocaine (LIDODERM) 5 % Place 1 patch onto the skin every 12 (twelve) hours. Remove & Discard patch within 12 hours or as directed by MD Patient not taking: Reported on 04/14/2021 02/09/21 02/09/22  Delton Prairie, MD   Review of Systems  Constitutional:  Positive for appetite change (decreased) and fatigue.  HENT:  Negative for congestion, postnasal drip and sore throat.   Eyes:  Positive for visual disturbance (complete vision loss in 1 eye and partial loss in the other).  Respiratory:  Positive for shortness of breath. Negative for cough and chest tightness.   Cardiovascular:  Negative for chest pain, palpitations and leg swelling.  Gastrointestinal:  Negative for abdominal  distention and abdominal pain.  Endocrine: Negative.   Genitourinary: Negative.   Musculoskeletal:  Positive for arthralgias (leg pain). Negative for back pain and neck pain.  Skin: Negative.   Allergic/Immunologic: Negative.   Neurological:  Positive for dizziness (sudden position changes). Negative for light-headedness.  Hematological:  Negative for adenopathy. Does not bruise/bleed easily.  Psychiatric/Behavioral:  Positive for dysphoric mood. Negative for sleep disturbance (sleeping on 1 pillow) and suicidal ideas. The patient is not nervous/anxious.    Vitals:   04/14/21 1314  BP: (!) 169/79  Pulse: 87  Resp: 18  SpO2: 99%  Weight: 163 lb 8 oz (74.2 kg)  Height: 5\' 6"  (1.676 m)   Wt Readings from Last 3 Encounters:  04/14/21 163 lb 8 oz (74.2 kg)  04/12/21 162 lb 4.1 oz (73.6 kg)  04/12/21 162 lb 6.4 oz (73.7 kg)   Lab Results  Component Value Date   CREATININE 0.96 04/12/2021   CREATININE 1.01 04/12/2021   CREATININE 0.95 04/07/2021    Physical Exam Vitals and nursing note reviewed.  Constitutional:      Appearance: Normal appearance.  HENT:     Head: Normocephalic and atraumatic.  Cardiovascular:     Rate and Rhythm: Normal rate and regular rhythm.  Pulmonary:     Effort: Pulmonary effort is normal. No respiratory distress.     Breath sounds: No wheezing or rales.  Abdominal:     General: There is no distension.     Palpations: Abdomen is soft.  Musculoskeletal:        General: No tenderness.     Cervical back: Normal range of motion and neck supple.     Right lower leg: No edema.     Left lower leg: No edema.  Skin:    General: Skin is warm and dry.  Neurological:     General: No focal deficit present.     Mental Status: She is alert and oriented to person, place, and time.  Psychiatric:        Behavior: Behavior normal.        Thought Content: Thought content normal.   Assessment & Plan:  1: Chronic heart failure with preserved ejection fraction  without structural changes- - NYHA class II - euvolemic today - hasn't been weighing daily but says that she has scales and that  she can see the numbers; instructed to weigh daily and call for an overnight weight gain of > 2 pounds or a weekly weight gain of > 5 pounds - not adding salt but admits that she eats saltier foods because she lives alone and can't see to read the ingredients; did accept the low sodium cookbook because she says that her granddaughter will cook for her at times - will update echo and this was scheduled for 05/18/21 - paramedicine referral made - BNP 04/07/21 was 725.9  2: HTN- - BP elevated (169/79) - saw PCP (Flinchum) 04/12/21 - BMP 04/12/21 reviewed and showed sodium 134, potassium 3.7, creatinine 0.96 & GFR 59  3: COPD- - using inhaler - sees pulmonology Patsey Berthold) 05/20/20  4: Atrial fibrillation- - saw cardiology (Fath) 07/28/20 - rate controlled at this time  5: Hypomagnesium- - patient says that she's not taking magnesium and her granddaughter just picked up her medications and that wasn't in there - will check BMP/ Magnesium level today and send in magnesium RX if needed   Patient reports complete vision loss in 1 eye and partial in the other and admits to great difficulty in reading the labels of medication bottles. She is in the process of transitioning her medications to [facility] pill pack to make it easier on her although I'm greatly concerned about what medications she may be currently taking or how she is taking them as she didn't bring her medications with her today. Will look into any services for the blind to see if that can benefit her.   Return in 1 month or sooner for any questions/problems before then.

## 2021-04-14 NOTE — Telephone Encounter (Signed)
Eileen Mack would like to add to the request for pt. She would like to request a medical social worker to connect pt with resources. An OT eval for ADL training. A prescription for walker and 3-n-1 commode.

## 2021-04-15 ENCOUNTER — Other Ambulatory Visit: Payer: Self-pay | Admitting: Family Medicine

## 2021-04-15 ENCOUNTER — Telehealth: Payer: Self-pay

## 2021-04-15 ENCOUNTER — Other Ambulatory Visit: Payer: Self-pay | Admitting: Family

## 2021-04-15 ENCOUNTER — Other Ambulatory Visit: Payer: Self-pay | Admitting: Adult Health

## 2021-04-15 DIAGNOSIS — I10 Essential (primary) hypertension: Secondary | ICD-10-CM

## 2021-04-15 DIAGNOSIS — J449 Chronic obstructive pulmonary disease, unspecified: Secondary | ICD-10-CM

## 2021-04-15 DIAGNOSIS — R918 Other nonspecific abnormal finding of lung field: Secondary | ICD-10-CM

## 2021-04-15 DIAGNOSIS — H547 Unspecified visual loss: Secondary | ICD-10-CM

## 2021-04-15 DIAGNOSIS — Z9181 History of falling: Secondary | ICD-10-CM | POA: Insufficient documentation

## 2021-04-15 MED ORDER — MAGNESIUM CHLORIDE 64 MG PO TBEC: 2.0000 | DELAYED_RELEASE_TABLET | Freq: Every day | ORAL | 3 refills | Status: DC

## 2021-04-15 NOTE — Progress Notes (Signed)
Orders Placed This Encounter  Procedures   For home use only DME Other see comment    Please provide patient with one 3 in one commode    Order Specific Question:   Length of Need    Answer:   [facility]   DME Bedside commode    Please provide one 3 in 1 toilet.    Order Specific Question:   Patient needs a bedside commode to treat with the following condition    Answer:   At high risk for falls [719513]   Walker rolling    DME - dispense one two wheel walker

## 2021-04-15 NOTE — Progress Notes (Signed)
Call patient and see if she has an appointment with [facility], I do not want her to drive. She should not be driving at all needs opthalmology evaluation as referred.   Home health orders for bedside commode 3-1 and two wheel walker were place in computer.  Do we have a DME form ?

## 2021-04-15 NOTE — Chronic Care Management (AMB) (Signed)
°  Chronic Care Management   Outreach Note  04/15/2021 Name: Eileen Mack MRN: [id number] DOB: 12-25-1935  Eileen Mack is a 85 y.o. year old female who is a primary care patient of Flinchum, Eula Fried, FNP. I reached out to [employer] by phone today in response to a referral sent by [name]'s primary care provider.  An unsuccessful telephone outreach was attempted today. The patient was referred to the case management team for assistance with care management and care coordination.   Follow Up Plan: The care management team will reach out to the patient again over the next 5 days.  If patient returns call to provider office, please advise to call Embedded Care Management Care Guide Penne Lash  at [phone]  Penne Lash, RMA Care Guide, Embedded Care Coordination [facility]  [place], [place] [address] Direct Dial: [phone] [email or web address] Website: [email or web address]

## 2021-04-15 NOTE — Telephone Encounter (Signed)
Ok to approve PT and OT orders. Can they send written fax for signature on walker and 3-1 commode.

## 2021-04-15 NOTE — Telephone Encounter (Addendum)
Message below called to patient. Patient acknowledged. Patient also informed that Clarisa Kindred, NP had referred Eileen Mack with the para-medicine program to help ensure medications are accurate and up to date, given medication changes, changes in pharmacy, and patient's vision impairment. Patient was agreeable to this. Patient instructed to call with further questions or concerns. Suanne Marker, RN ----- Message from Delma Freeze, FNP sent at 04/15/2021  9:38 AM EST ----- Sodium, potassium and kidneys are normal. Magnesium is a little low so prescription sent to [facility] pill pack for her to take 2 tablets every day.

## 2021-04-16 ENCOUNTER — Other Ambulatory Visit: Payer: Self-pay | Admitting: Family

## 2021-04-16 MED ORDER — MAGNESIUM CHLORIDE 64 MG PO TBEC: 2.0000 | DELAYED_RELEASE_TABLET | Freq: Every day | ORAL | 3 refills | Status: AC

## 2021-04-16 NOTE — Progress Notes (Signed)
Called pt. Unable to LVM

## 2021-04-16 NOTE — Telephone Encounter (Signed)
Eileen Mack called office back. She was given verbal orders. Scripts for commode and walker faxed to [facility].

## 2021-04-19 ENCOUNTER — Other Ambulatory Visit: Payer: Self-pay | Admitting: Family Medicine

## 2021-04-19 ENCOUNTER — Other Ambulatory Visit (HOSPITAL_COMMUNITY): Payer: Self-pay

## 2021-04-19 DIAGNOSIS — E785 Hyperlipidemia, unspecified: Secondary | ICD-10-CM

## 2021-04-19 DIAGNOSIS — I10 Essential (primary) hypertension: Secondary | ICD-10-CM

## 2021-04-19 NOTE — Progress Notes (Signed)
First time visiting with her.  She lives alone with family close by to help her.  She is baking a carrot cake when arrived, she gets around her home well.  Explained what I can help her with, she said she is working on getting pill packs but if I could place her meds in boxes for 2 weeks her pill packs should start.  She has not picked up magnesium yet, contacted pharmacy and it is ready to be picked up.  She is out of atorvastatin and no refills, pharmacy is calling her doctor.  She is not taking buspar and has no prescription for it at pharmacy.  She is still taking omeprazole not famotidine, she has no prescription for that at her pharmacy.  She is switching PCP's will get up with [facility] to make sure they get her prescriptions sent over to correct pharmacy.  Placed her meds in boxes and she appeared to understand how to take them.  She keeps her thyroid med in her bathroom to take first thing in the morning.  She is able to do most things for herself but her sight is limited and has hard time reading bottles.  Will continue to visit for medications compliance and help her to switch to pill packs.   Earmon [place] EMT-Paramedic [phone]

## 2021-04-19 NOTE — Chronic Care Management (AMB) (Signed)
Chronic Care Management   Note  04/19/2021 Name: KATHARIN SCHNEIDER MRN: [id number] DOB: 04/23/1936  Nigel Berthold Abernethy is a 85 y.o. year old female who is a primary care patient of Flinchum, Kelby Aline, FNP. I reached out to [employer] by phone today in response to a referral sent by Ms. Shaolin J Daloia's PCP.  Ms. Natividad was given information about Chronic Care Management services today including:  CCM service includes personalized support from designated clinical staff supervised by her physician, including individualized plan of care and coordination with other care providers 24/7 contact phone numbers for assistance for urgent and routine care needs. Service will only be billed when office clinical staff spend 20 minutes or more in a month to coordinate care. Only one practitioner may furnish and bill the service in a calendar month. The patient may stop CCM services at any time (effective at the end of the month) by phone call to the office staff. The patient is responsible for co-pay (up to 20% after annual deductible is met) if co-pay is required by the individual health plan.   Patient agreed to services and verbal consent obtained.   Follow up plan: Patient declines  engagement by the care management team. Appropriate care team members and provider have been notified via electronic communication.   Noreene Larsson, [place], [place], [place] [address] Direct Dial: [phone] [email or web address] Website: [email or web address]

## 2021-04-20 ENCOUNTER — Telehealth: Payer: Medicare HMO

## 2021-04-20 ENCOUNTER — Telehealth: Payer: Self-pay

## 2021-04-20 NOTE — Telephone Encounter (Signed)
Physical therapist Sabra Heck from [facility] called regarding pt. Pt experiencing pressure in diaphragm area and angina early this morning. Stated pt wasn't having the feeling anymore once she saw her. Pt stated she didn't take her nitroglycerin and that she is out of her magnesium. Pt vitals : 138/70 76 98%  Rochelle provided her phone number [phone]) for any further questions

## 2021-04-20 NOTE — Telephone Encounter (Signed)
Please call patient to triage her Eileen Mack.  If she is having chest pain, will need her to go to the ER for further urgent work up. Let me know.   Community paramedic visited home today and states in note : .  Explained what I can help her with, she said she is working on getting pill packs but if I could place her meds in boxes for 2 weeks her pill packs should start.  She has not picked up magnesium yet, contacted pharmacy and it is ready to be picked up  Please verify if she is taking the magnesium as prescribed by Clarisa Kindred NP.

## 2021-04-20 NOTE — Telephone Encounter (Signed)
Placed call to pt to triage. Unable to LVM

## 2021-04-21 ENCOUNTER — Telehealth: Payer: Self-pay

## 2021-04-21 NOTE — Telephone Encounter (Signed)
Attempted to reach patient by phone to check how she is feeling after Clarisa Kindred, NP, received a message from patient's PCP that the patient left a voicemail for them with complaints of chest pain and they were unable to reach her by phone to return her call. I was unable to reach patient as well and voicemail box is full. I also called the patient's granddaughter/emergency contact and was unable to reach the granddaughter.  Suanne Marker, RN

## 2021-04-21 NOTE — Telephone Encounter (Signed)
Thank you Eileen Mack, so far she has not returned call to office.    Eileen Kindred A, FNP  You 27 minutes ago (8:32 AM)   Thank you, we will also reach out to her.

## 2021-04-21 NOTE — Telephone Encounter (Signed)
Noted and same on our end. Thank you.

## 2021-04-22 NOTE — Telephone Encounter (Signed)
LMTCB about provider message below

## 2021-04-27 ENCOUNTER — Telehealth: Payer: Self-pay | Admitting: Adult Health

## 2021-04-27 NOTE — Telephone Encounter (Signed)
Rejection Reason - Patient did not respond" Virgil Benedict said on Apr 20, 2021 9:50 AM  "lm 04/13/21 " Katelyn Deel said on Apr 14, 2021 8:55 AM  Msg from [facility]

## 2021-04-27 NOTE — Telephone Encounter (Signed)
Noted  

## 2021-05-05 ENCOUNTER — Ambulatory Visit: Payer: Medicare HMO | Admitting: Family Medicine

## 2021-05-05 ENCOUNTER — Telehealth: Payer: Self-pay | Admitting: Adult Health

## 2021-05-05 NOTE — Telephone Encounter (Signed)
Eileen Mack,  See note below, recommend medical examiner to determine cause of death.

## 2021-05-05 NOTE — Telephone Encounter (Signed)
Unable to see or  sign certificate in [facility], still listed with [facility] ticket created with [facility] to change to [place]- however given sudden death nd only seeing patient once recommend defer to medical examiner for cause of death.

## 2021-05-05 NOTE — Telephone Encounter (Signed)
[facility] [employer] called in stated patient expired 12/26 @ 1:34AM @ [facility] and  [place] stated need Death Certificate sign. Please call Denton Lank at [phone]

## 2021-05-05 NOTE — Telephone Encounter (Signed)
It does say [facility].

## 2021-05-06 ENCOUNTER — Encounter: Payer: Self-pay | Admitting: Adult Health

## 2021-05-06 NOTE — Progress Notes (Signed)
Death certificate completed with Dr. Darrick Huntsman today 05/06/2021.

## 2021-05-15 ENCOUNTER — Other Ambulatory Visit: Payer: Self-pay | Admitting: Family Medicine

## 2021-05-15 DIAGNOSIS — G629 Polyneuropathy, unspecified: Secondary | ICD-10-CM

## 2021-05-15 DIAGNOSIS — E785 Hyperlipidemia, unspecified: Secondary | ICD-10-CM

## 2021-05-18 ENCOUNTER — Ambulatory Visit: Payer: Medicare HMO | Admitting: Family

## 2021-05-18 ENCOUNTER — Ambulatory Visit: Payer: Medicare HMO

## 2021-05-20 ENCOUNTER — Institutional Professional Consult (permissible substitution): Payer: Medicare HMO | Admitting: Pulmonary Disease

## 2021-08-02 IMAGING — DX DG CHEST 1V PORT
2 series · 2 of 2 positions shown · non-contrast
Comparison: 04/01/2016

CLINICAL DATA: Fever, cough

EXAM:
PORTABLE CHEST 1 VIEW

[chest ap (1 of 2)]
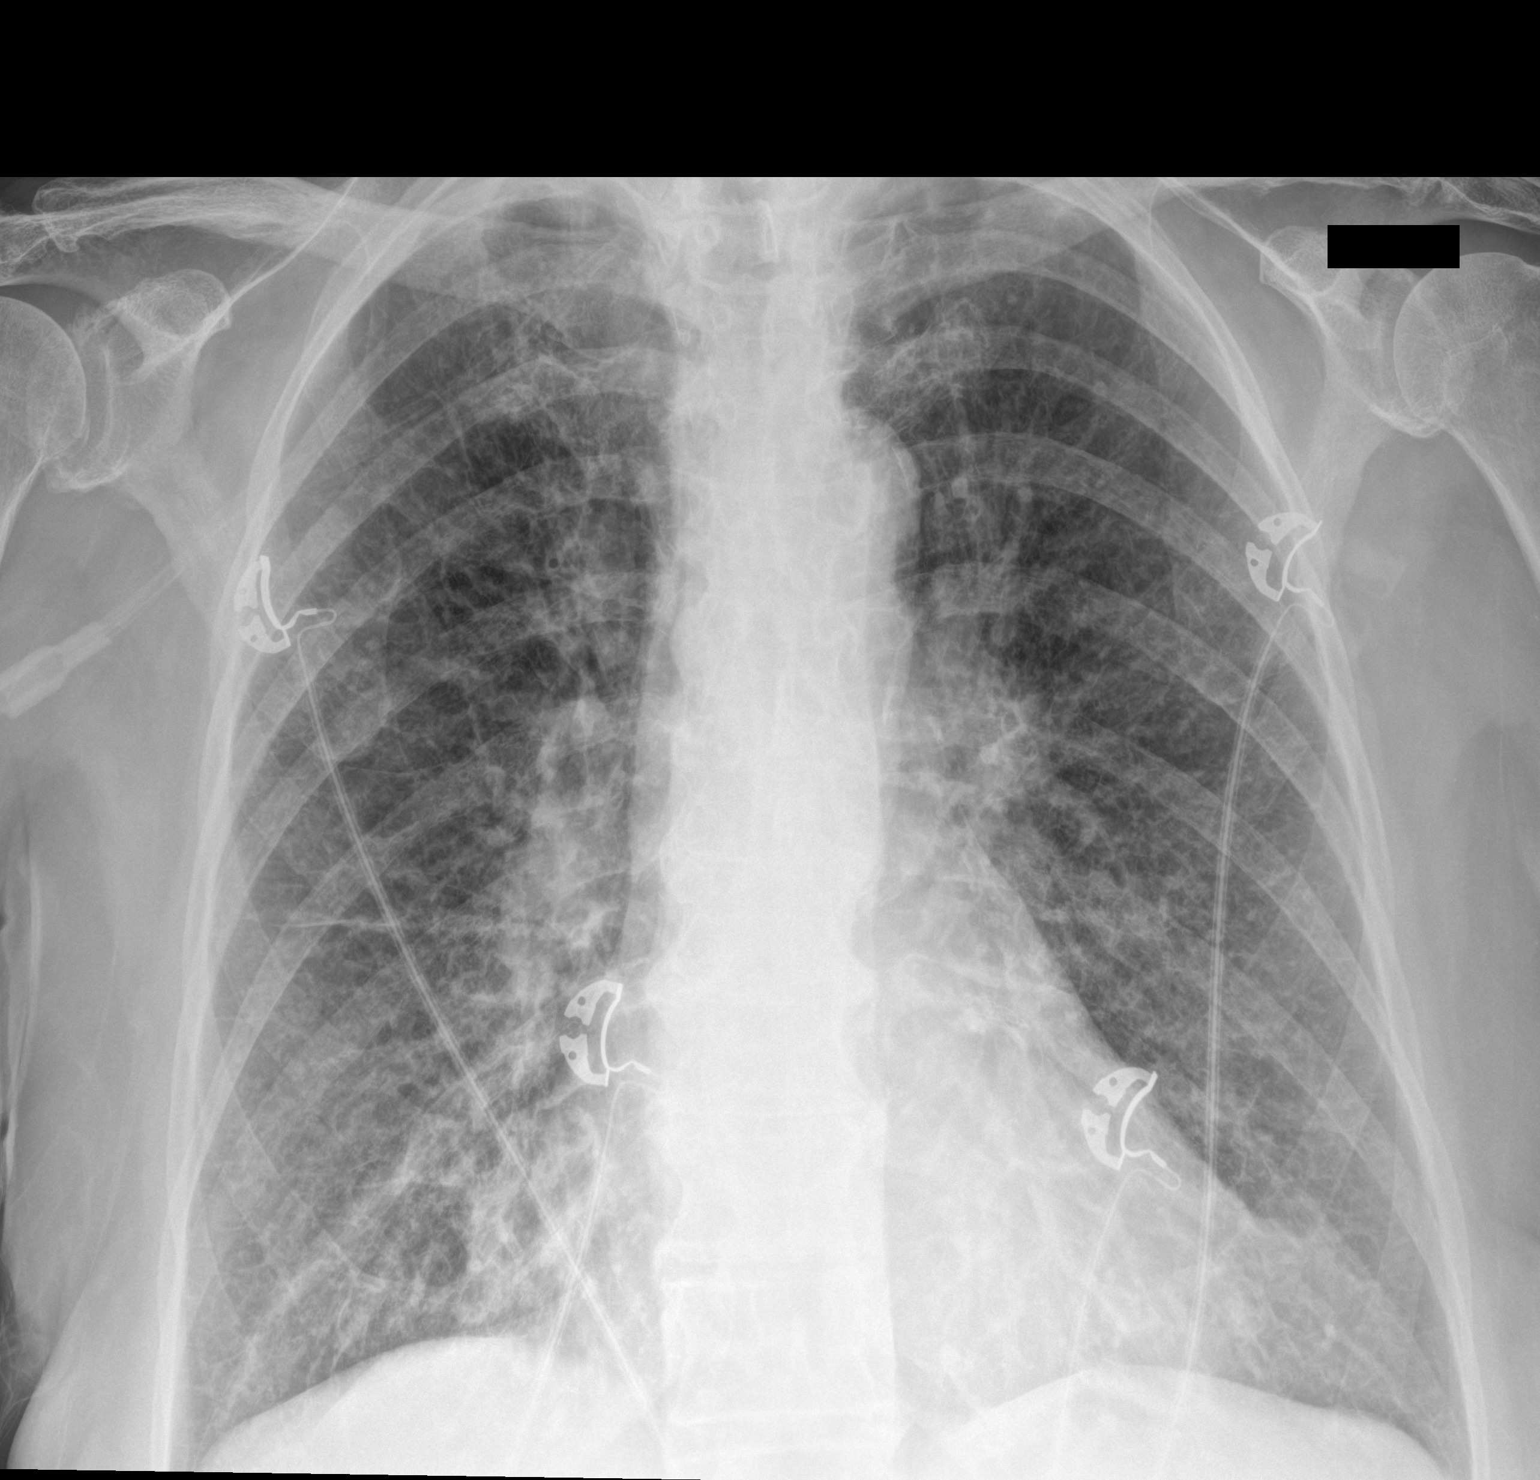

[chest ap (2 of 2)]
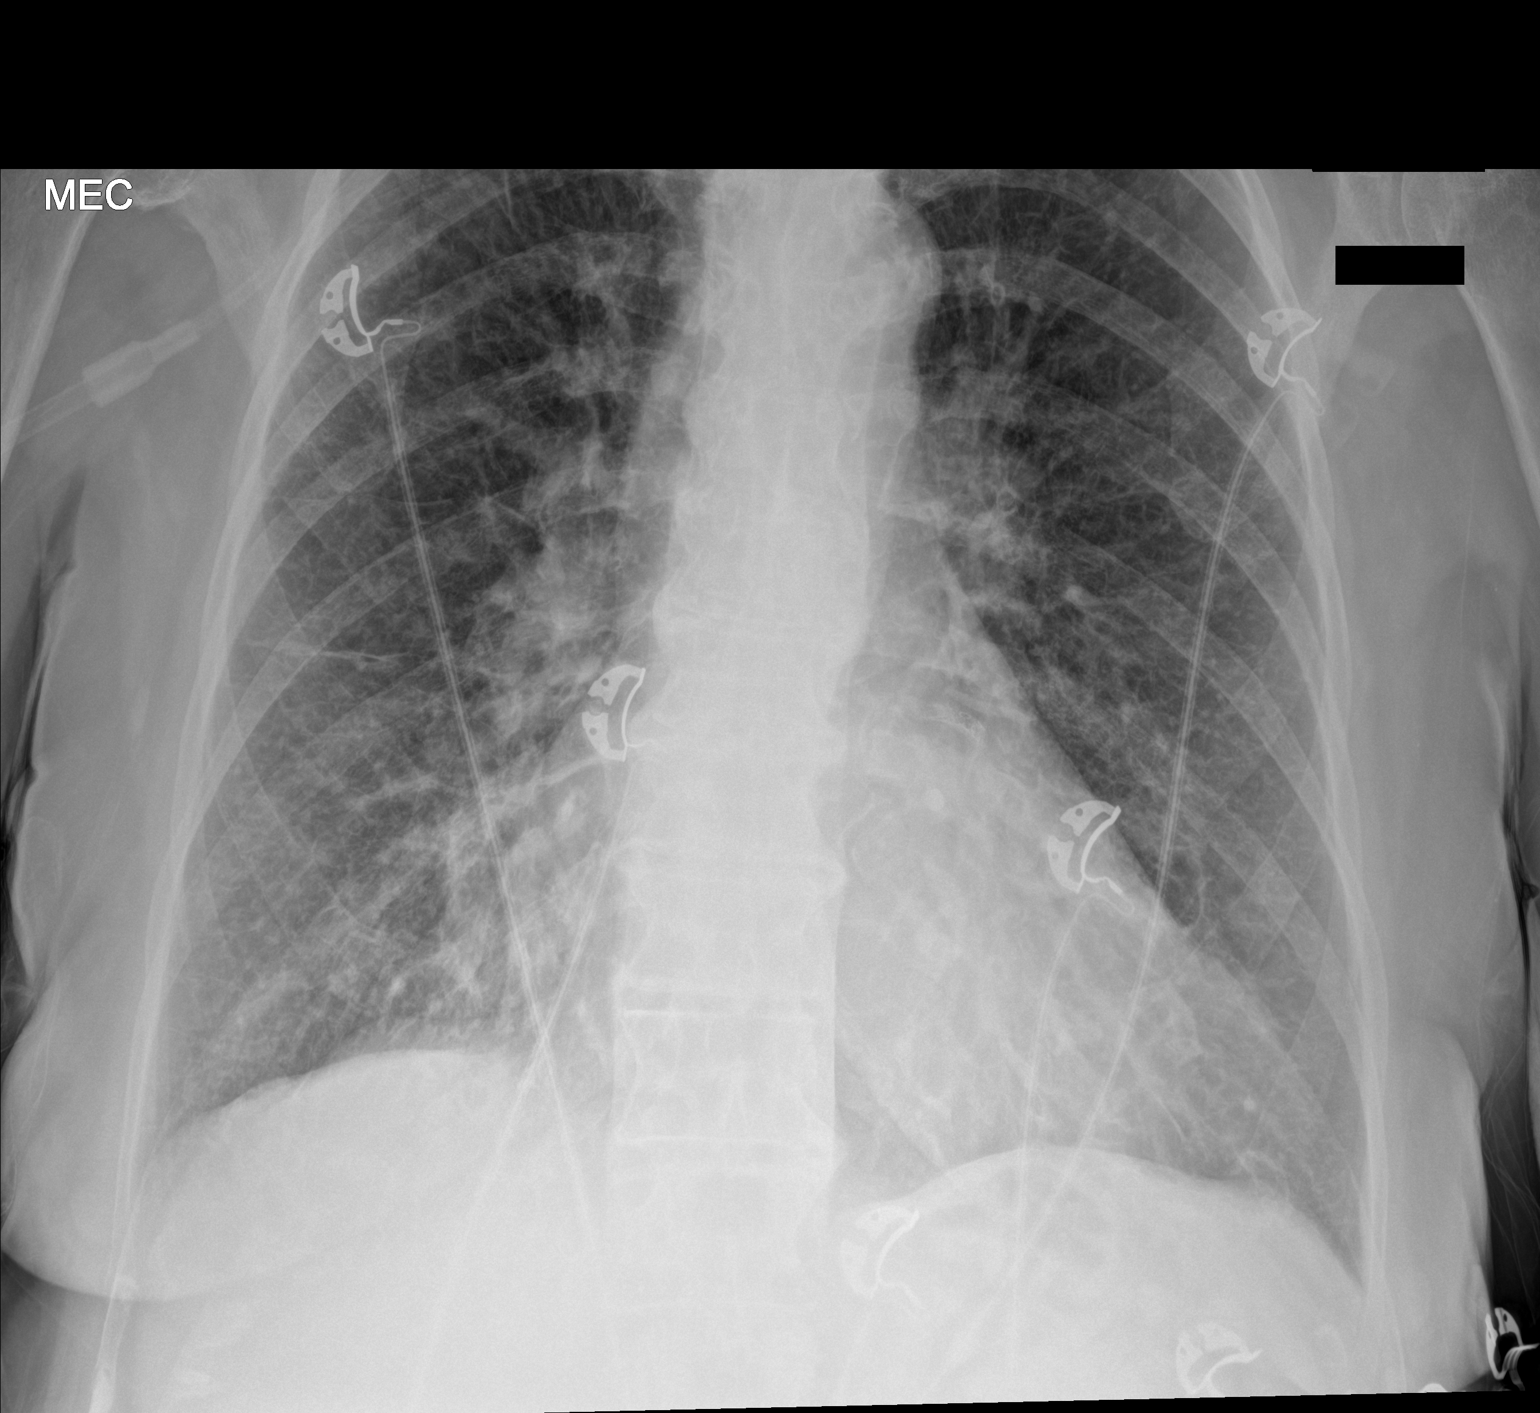

[2 of 2 positions shown; findings below may reference images not displayed]

FINDINGS: There is hyperinflation of the lungs compatible with COPD. Heart is
borderline in size. Diffuse interstitial prominence likely reflects
chronic interstitial lung disease. No confluent opacities or
effusions. No acute bony abnormality.
IMPRESSION: COPD. Interstitial prominence likely reflects chronic interstitial
lung disease.

No acute cardiopulmonary disease.

## 2023-11-09 IMAGING — CT CT ANGIO CHEST
2 of 7 series · 18 of 46 positions shown · IV contrast (APPLIED)
Comparison: Chest radiograph and perfusion dated 04/07/2021.

CLINICAL DATA: Positive D-dimer.  Concern for pulmonary embolism.

EXAM:
CT ANGIOGRAPHY CHEST WITH CONTRAST
TECHNIQUE: Multidetector CT imaging of the chest was performed using the
standard protocol during bolus administration of intravenous
contrast. Multiplanar CT image reconstructions and MIPs were
obtained to evaluate the vascular anatomy.
CONTRAST:  75mL OMNIPAQUE IOHEXOL 350 MG/ML SOLN

[Series 5: thins · axial · 0.75mm/px · z∈[-240,+66]mm · 15 of 426 slices shown]
[im 22/426  lung]
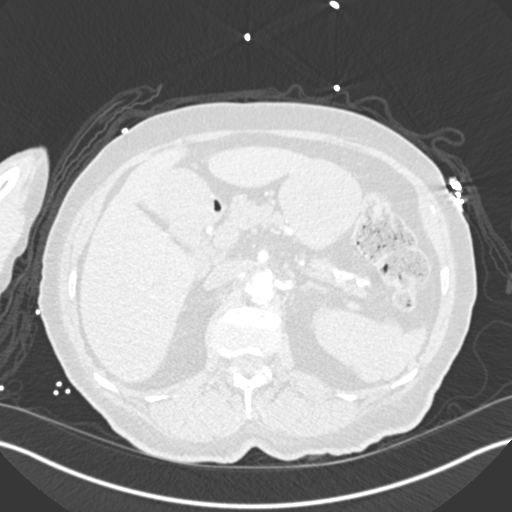
[im 43/426  soft-tissue]
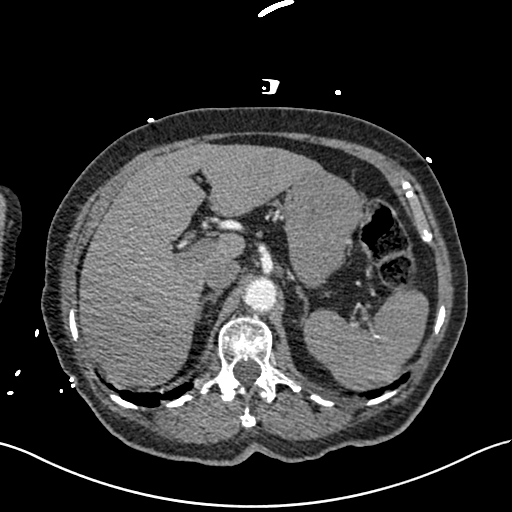
[im 86/426  lung]
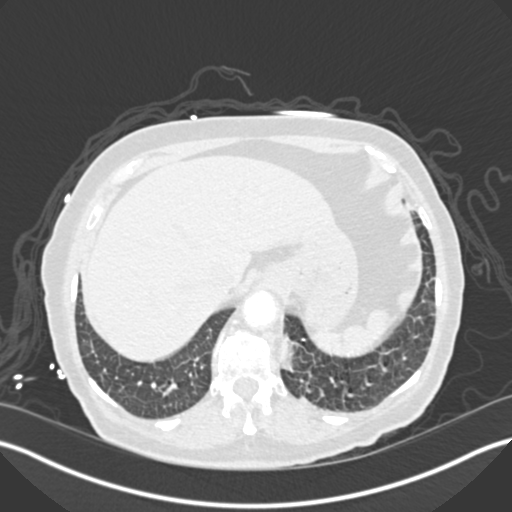
[im 107/426  soft-tissue]
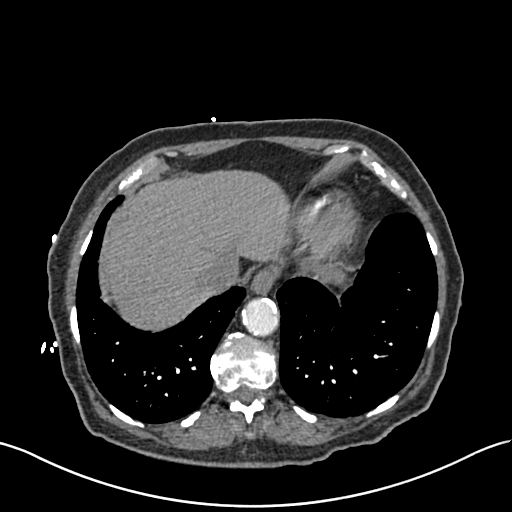
[im 128/426  lung]
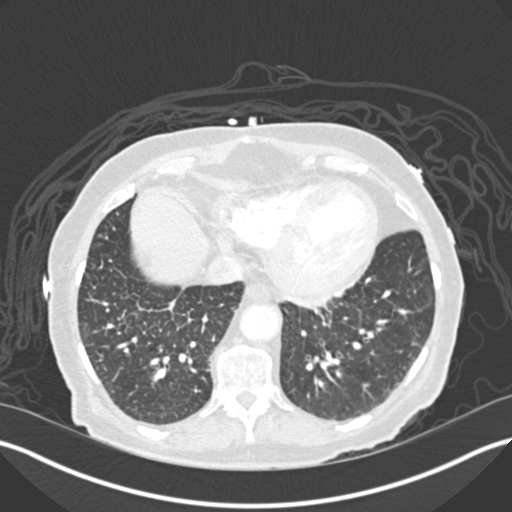
[im 149/426  soft-tissue]
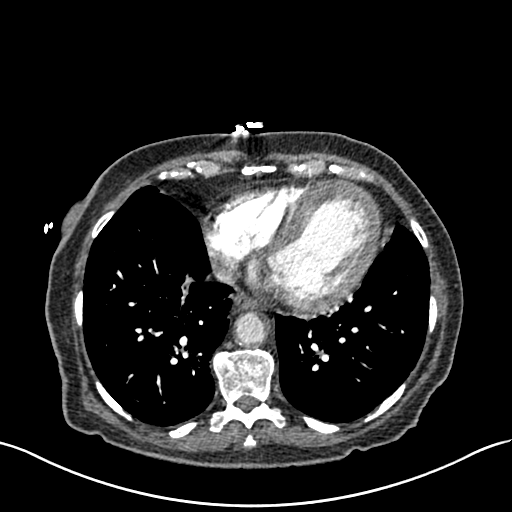
[im 192/426  lung]
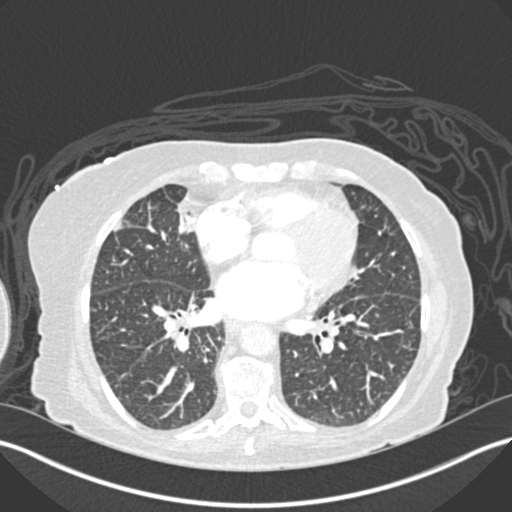
[im 213/426  soft-tissue]
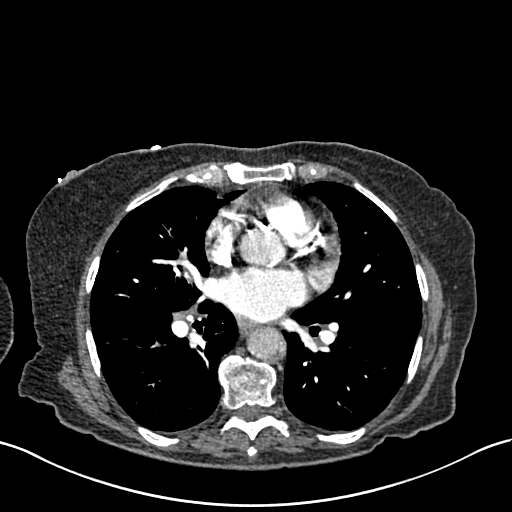
[im 234/426  lung]
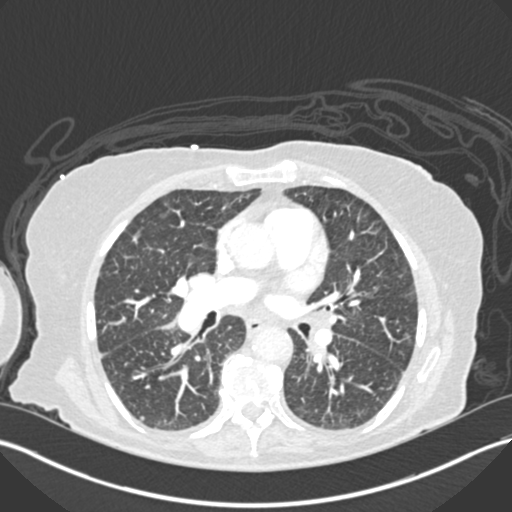
[im 277/426  soft-tissue]
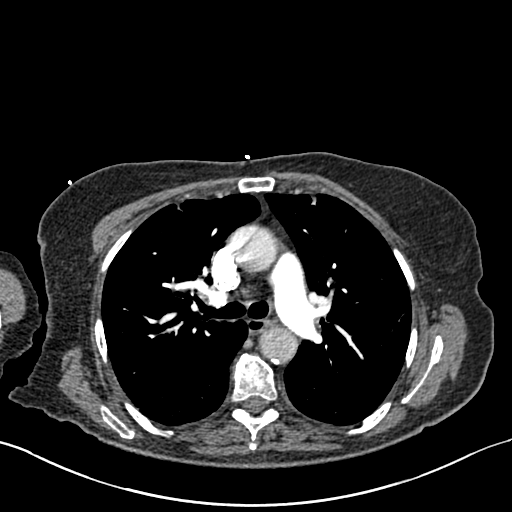
[im 298/426  lung]
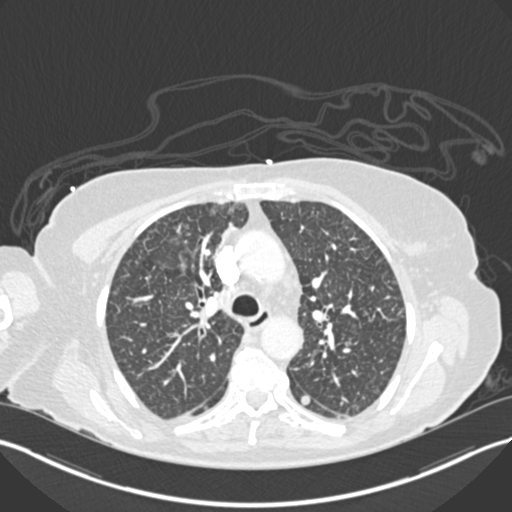
[im 319/426  soft-tissue]
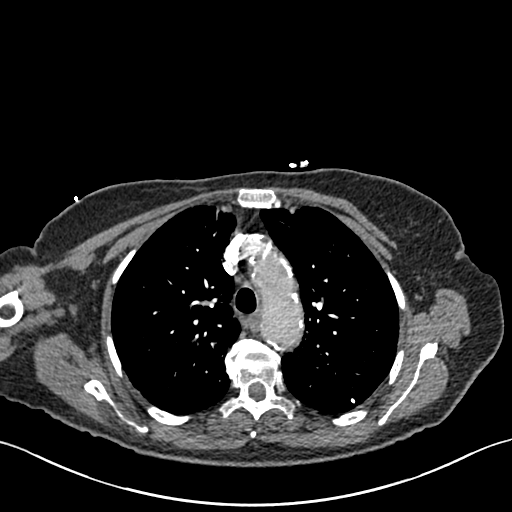
[im 341/426  lung]
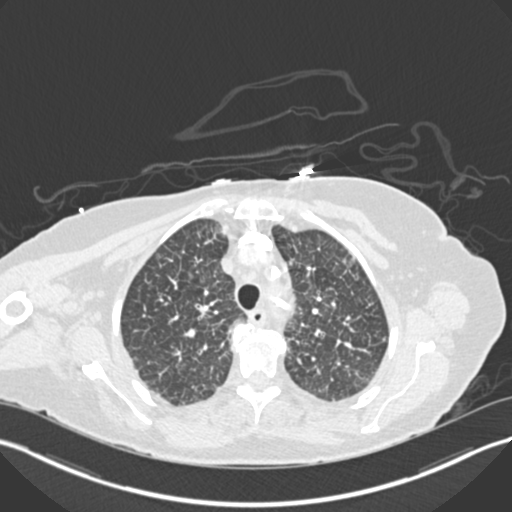
[im 383/426  soft-tissue]
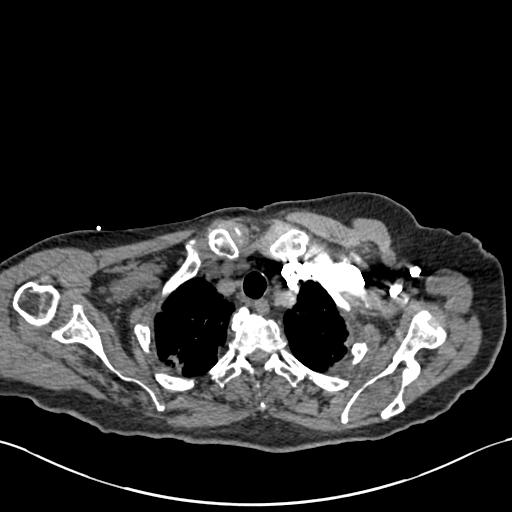
[im 404/426  lung]
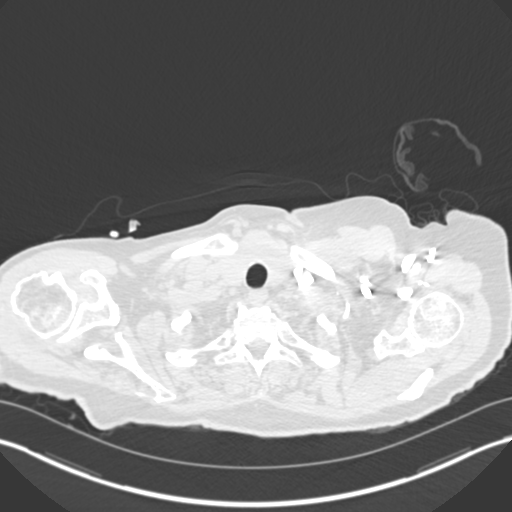

[Series 7: coronal mpr · coronal · 0.71mm/px · 3 of 87 slices shown]
[im 22/87  soft-tissue]
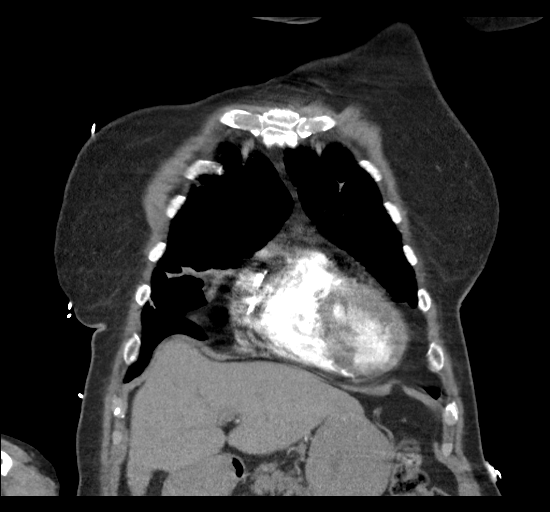
[im 44/87  soft-tissue]
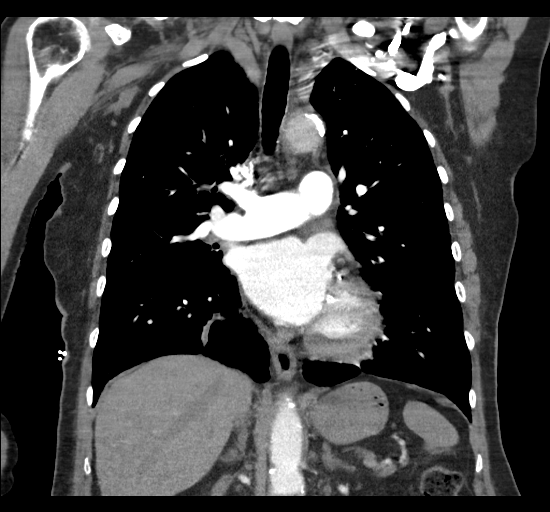
[im 65/87  soft-tissue]
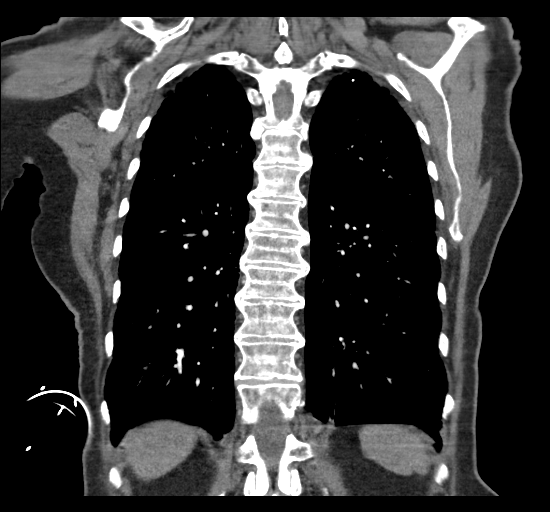

[18 of 46 positions shown; findings below may reference images not displayed]

FINDINGS: Evaluation of this exam is limited due to respiratory motion
artifact.

Cardiovascular: There is no cardiomegaly or pericardial effusion.
Three-vessel coronary vascular calcification. There is advanced
atherosclerotic calcification of the thoracic aorta. No pulmonary
artery embolus identified.

Mediastinum/Nodes: Top-normal right hilar lymph node. No mediastinal
adenopathy. The esophagus is grossly unremarkable. No mediastinal
fluid collection.

Lungs/Pleura: There is diffuse interstitial and interlobular septal
prominence consistent with edema. Several scattered pulmonary
nodules measure up to 9 mm in the right lower lobe (68/6). Biapical
subpleural and bibasilar scarring. No lobar consolidation, pleural
effusion, or pneumothorax. The central airways are patent.

Upper Abdomen: Cholecystectomy.

Musculoskeletal: Degenerative changes of the spine. No acute osseous
pathology.

Review of the MIP images confirms the above findings.
IMPRESSION: 1. No CT evidence of pulmonary embolism.
2. Pulmonary edema.
3. Scattered pulmonary nodules measuring up to 9 mm in the right
lower lobe. Non-contrast chest CT at 3-6 months is recommended. If
the nodules are stable at time of repeat CT, then future CT at 18-24
months (from today's scan) is considered optional for low-risk
patients, but is recommended for high-risk patients. This
recommendation follows the consensus statement: Guidelines for
Management of Incidental Pulmonary Nodules Detected on CT Images:
4. Aortic Atherosclerosis (1I680-RHX.X).
